# Patient Record
Sex: Male | Born: 1980 | Race: Black or African American | Hispanic: No | Marital: Married | Smoking: Current every day smoker
Health system: Southern US, Community
[De-identification: ages and names within clinical notes are randomized; demographics above are authoritative.]

## PROBLEM LIST (undated history)

## (undated) ENCOUNTER — Ambulatory Visit: Discharge status: Absent without leave | Payer: PRIVATE HEALTH INSURANCE

## (undated) ENCOUNTER — Ambulatory Visit: Admission: EM | Payer: Self-pay

## (undated) DIAGNOSIS — I10 Essential (primary) hypertension: Secondary | ICD-10-CM

## (undated) DIAGNOSIS — F419 Anxiety disorder, unspecified: Secondary | ICD-10-CM

## (undated) DIAGNOSIS — K219 Gastro-esophageal reflux disease without esophagitis: Secondary | ICD-10-CM

## (undated) HISTORY — DX: Anxiety disorder, unspecified: F41.9

---

## 2006-09-21 ENCOUNTER — Emergency Department (HOSPITAL_COMMUNITY): Admission: EM | Admit: 2006-09-21 | Discharge: 2006-09-21 | Payer: Self-pay | Admitting: Emergency Medicine

## 2007-06-14 ENCOUNTER — Emergency Department (HOSPITAL_COMMUNITY): Admission: EM | Admit: 2007-06-14 | Discharge: 2007-06-14 | Payer: Self-pay | Admitting: Emergency Medicine

## 2007-06-16 ENCOUNTER — Emergency Department (HOSPITAL_COMMUNITY): Admission: EM | Admit: 2007-06-16 | Discharge: 2007-06-16 | Payer: Self-pay | Admitting: Emergency Medicine

## 2011-04-15 LAB — COMPREHENSIVE METABOLIC PANEL
ALT: 43
AST: 30
Albumin: 4.4
BUN: 5 — ABNORMAL LOW
Calcium: 9.2
Chloride: 104
GFR calc Af Amer: >60
Potassium: 3.4 — ABNORMAL LOW
Sodium: 140

## 2011-04-15 LAB — DIFFERENTIAL
Basophils Absolute: 0
Basophils Absolute: 0
Eosinophils Absolute: 0.1 — ABNORMAL LOW
Eosinophils Absolute: 0.1 — ABNORMAL LOW
Eosinophils Relative: 2
Eosinophils Relative: 2
Lymphocytes Relative: 42
Monocytes Absolute: 0.5
Neutro Abs: 3
Neutro Abs: 3.5
Neutrophils Relative %: 53

## 2011-04-15 LAB — URINALYSIS, ROUTINE W REFLEX MICROSCOPIC
Bilirubin Urine: NEGATIVE
Ketones, ur: NEGATIVE
Nitrite: NEGATIVE
Nitrite: NEGATIVE
Specific Gravity, Urine: 1.009
pH: 6

## 2011-04-15 LAB — CBC
HCT: 50.2
MCHC: 33.5
MCV: 96.9
RBC: 5.22
RDW: 12.8
WBC: 6.6

## 2011-04-15 LAB — I-STAT 8, (EC8 V) (CONVERTED LAB)
Acid-Base Excess: 3 — ABNORMAL HIGH
BUN: 5 — ABNORMAL LOW
Bicarbonate: 29.4 — ABNORMAL HIGH
Chloride: 105
Potassium: 4.1
pCO2, Ven: 50.5 — ABNORMAL HIGH

## 2011-04-15 LAB — URINE MICROSCOPIC-ADD ON

## 2011-09-06 ENCOUNTER — Emergency Department (HOSPITAL_COMMUNITY): Payer: 59

## 2011-09-06 ENCOUNTER — Encounter (HOSPITAL_COMMUNITY): Payer: Self-pay | Admitting: Emergency Medicine

## 2011-09-06 ENCOUNTER — Emergency Department (HOSPITAL_COMMUNITY)
Admission: EM | Admit: 2011-09-06 | Discharge: 2011-09-06 | Disposition: A | Discharge status: Home / Self Care | Payer: 59 | Attending: Emergency Medicine | Admitting: Emergency Medicine

## 2011-09-06 DIAGNOSIS — J40 Bronchitis, not specified as acute or chronic: Secondary | ICD-10-CM | POA: Insufficient documentation

## 2011-09-06 DIAGNOSIS — K219 Gastro-esophageal reflux disease without esophagitis: Secondary | ICD-10-CM | POA: Insufficient documentation

## 2011-09-06 DIAGNOSIS — R51 Headache: Secondary | ICD-10-CM | POA: Insufficient documentation

## 2011-09-06 DIAGNOSIS — Z8639 Personal history of other endocrine, nutritional and metabolic disease: Secondary | ICD-10-CM | POA: Insufficient documentation

## 2011-09-06 DIAGNOSIS — R059 Cough, unspecified: Secondary | ICD-10-CM | POA: Insufficient documentation

## 2011-09-06 DIAGNOSIS — J029 Acute pharyngitis, unspecified: Secondary | ICD-10-CM | POA: Insufficient documentation

## 2011-09-06 DIAGNOSIS — J3489 Other specified disorders of nose and nasal sinuses: Secondary | ICD-10-CM | POA: Insufficient documentation

## 2011-09-06 DIAGNOSIS — H9209 Otalgia, unspecified ear: Secondary | ICD-10-CM | POA: Insufficient documentation

## 2011-09-06 DIAGNOSIS — R509 Fever, unspecified: Secondary | ICD-10-CM | POA: Insufficient documentation

## 2011-09-06 DIAGNOSIS — IMO0001 Reserved for inherently not codable concepts without codable children: Secondary | ICD-10-CM | POA: Insufficient documentation

## 2011-09-06 DIAGNOSIS — R05 Cough: Secondary | ICD-10-CM | POA: Insufficient documentation

## 2011-09-06 DIAGNOSIS — Z862 Personal history of diseases of the blood and blood-forming organs and certain disorders involving the immune mechanism: Secondary | ICD-10-CM | POA: Insufficient documentation

## 2011-09-06 HISTORY — DX: Gastro-esophageal reflux disease without esophagitis: K21.9

## 2011-09-06 MED ORDER — IBUPROFEN 800 MG PO TABS
800.0000 mg | ORAL_TABLET | Freq: Once | ORAL | Status: AC
  Administered 2011-09-06: 800 mg via ORAL
  Filled 2011-09-06: qty 1

## 2011-09-06 MED ORDER — GUAIFENESIN-CODEINE 100-10 MG/5ML PO SYRP: 10.0000 mL | ORAL_SOLUTION | Freq: Three times a day (TID) | ORAL | Status: AC | PRN

## 2011-09-06 MED ORDER — HYDROCOD POLST-CHLORPHEN POLST 10-8 MG/5ML PO LQCR
5.0000 mL | Freq: Once | ORAL | Status: AC
  Administered 2011-09-06: 5 mL via ORAL
  Filled 2011-09-06: qty 5

## 2011-09-06 NOTE — ED Notes (Signed)
Patient reports c/o cough, congestion, head pressure x 5 days. Denies nausea/vomiting/diarrhea.

## 2011-09-06 NOTE — Discharge Instructions (Signed)
Bronchitis Bronchitis is a problem of the air tubes leading to your lungs. This problem makes it hard for air to get in and out of the lungs. You may cough a lot because your air tubes are narrow. Going without care can cause lasting (chronic) bronchitis. HOME CARE   Drink enough fluids to keep your pee (urine) clear or pale yellow.   Use a cool mist humidifier.   Quit smoking if you smoke. If you keep smoking, the bronchitis might not get better.   Only take medicine as told by your doctor.  GET HELP RIGHT AWAY IF:   Coughing keeps you awake.   You start to wheeze.   You become more sick or weak.   You have a hard time breathing or get short of breath.   You cough up blood.   Coughing lasts more than 2 weeks.   You have a fever.   Your baby is older than 3 months with a rectal temperature of 102 F (38.9 C) or higher.   Your baby is 18 months old or younger with a rectal temperature of 100.4 F (38 C) or higher.  MAKE SURE YOU:  Understand these instructions.   Will watch your condition.   Will get help right away if you are not doing well or get worse.  Document Released: 12/11/2007 Document Revised: 03/06/2011 Document Reviewed: 05/26/2009 [facility] Patient Information 2012 [place], [place].Sore Throat Sore throats may be caused by bacteria and viruses. They may also be caused by:  Smoking.   Pollution.   Allergies.  If a sore throat is due to strep infection (a bacterial infection), you may need:  A throat swab.   A culture test to verify the strep infection.  You will need one of these:  An antibiotic shot.   Oral medicine for a full 10 days.  Strep infection is very contagious. A doctor should check any close contacts who have a sore throat or fever. A sore throat caused by a virus infection will usually last only 3-4 days. Antibiotics will not treat a viral sore throat.  Infectious mononucleosis (a viral disease), however, can cause a sore throat that  lasts for up to 3 weeks. Mononucleosis can be diagnosed with blood tests. You must have been sick for at least 1 week in order for the test to give accurate results. HOME CARE INSTRUCTIONS   To treat a sore throat, take mild pain medicine.   Increase your fluids.   Eat a soft diet.   Do not smoke.   Gargling with warm water or salt water (1 tsp. salt in 8 oz. water) can be helpful.   Try throat sprays or lozenges or sucking on hard candy to ease the symptoms.  Call your doctor if your sore throat lasts longer than 1 week.  SEEK IMMEDIATE MEDICAL CARE IF:  You have difficulty breathing.   You have increased swelling in the throat.   You have pain so severe that you are unable to swallow fluids or your saliva.   You have a severe headache, a high fever, vomiting, or a red rash.  Document Released: 08/01/2004 Document Revised: 03/06/2011 Document Reviewed: 06/11/2007 [facility] Patient Information 2012 [place], [place].Salt Water Gargle This solution will help make your mouth and throat feel better. HOME CARE INSTRUCTIONS   Mix 1 teaspoon of salt in 8 ounces of warm water.   Gargle with this solution as much or often as you need or as directed. Swish and gargle gently  if you have any sores or wounds in your mouth.   Do not swallow this mixture.  Document Released: 03/28/2004 Document Revised: 03/06/2011 Document Reviewed: 08/19/2008 [facility] Patient Information 2012 [place], [place].

## 2011-09-06 NOTE — ED Notes (Signed)
Patient with no complaints at this time. Respirations even and unlabored. Skin warm/dry. Discharge instructions reviewed with patient at this time. Patient given opportunity to voice concerns/ask questions. Patient discharged at this time and left Emergency Department with steady gait.   

## 2011-09-06 NOTE — ED Notes (Signed)
Al Decant, PA at bedside for patient eval.

## 2011-09-06 NOTE — ED Provider Notes (Signed)
Medical screening examination/treatment/procedure(s) were performed by non-physician practitioner and as supervising physician I was immediately available for consultation/collaboration.   Celene Kras, MD 09/06/11 [phone]

## 2011-09-06 NOTE — ED Provider Notes (Signed)
History     CSN: [id number]  Arrival date & time 09/06/11  0917   First MD Initiated Contact with Patient 09/06/11 [phone]      Chief Complaint  Patient presents with  . Cough  . Nasal Congestion    (Consider location/radiation/quality/duration/timing/severity/associated sxs/prior treatment) HPI Comments: Pt's children have been sick with similar sxs.  Frequent coughing makes his throat feel worse  Patient is a 31 y.o. male presenting with cough. The history is provided by the patient. No language interpreter was used.  Cough This is a new problem. Episode onset: several days ago. The problem occurs every few minutes. The problem has not changed since onset.The cough is non-productive. Maximum temperature: + subjective fever. Associated symptoms include ear pain, headaches, sore throat and myalgias. Pertinent negatives include no shortness of breath and no wheezing. Treatments tried: OTC cold products. The treatment provided mild relief.    Past Medical History  Diagnosis Date  . GERD (gastroesophageal reflux disease)   . Gout     History reviewed. No pertinent past surgical history.  No family history on file.  History  Substance Use Topics  . Smoking status: Current Everyday Smoker -- 0.5 packs/day  . Smokeless tobacco: Not on file  . Alcohol Use: Yes     occasionally      Review of Systems  Constitutional: Positive for fever.  HENT: Positive for ear pain and sore throat.   Respiratory: Positive for cough. Negative for shortness of breath, wheezing and stridor.   Gastrointestinal: Negative for nausea, vomiting and diarrhea.  Musculoskeletal: Positive for myalgias.  Neurological: Positive for headaches.  All other systems reviewed and are negative.    Allergies  Penicillins  Home Medications  No current outpatient prescriptions on file.  BP 134/84  Pulse 78  Temp(Src) 98 F (36.7 C) (Oral)  Resp 18  Ht 5\' 11"  (1.803 m)  Wt 248 lb (112.492 kg)  BMI 34.59  kg/m2  SpO2 97%  Physical Exam  Nursing note and vitals reviewed. Constitutional: He is oriented to person, place, and time. He appears well-developed and well-nourished. No distress.  HENT:  Head: Normocephalic and atraumatic.  Right Ear: Hearing, tympanic membrane, external ear and ear canal normal.  Left Ear: Hearing, tympanic membrane, external ear and ear canal normal.  Nose: Nose normal.  Mouth/Throat: Uvula is midline and mucous membranes are normal. No uvula swelling. Posterior oropharyngeal erythema present. No oropharyngeal exudate, posterior oropharyngeal edema or tonsillar abscesses.  Eyes: EOM are normal.  Neck: Normal range of motion.  Cardiovascular: Normal rate, regular rhythm, normal heart sounds and intact distal pulses.   Pulmonary/Chest: Effort normal and breath sounds normal. No accessory muscle usage. Not tachypneic. No respiratory distress. He has no decreased breath sounds. He has no wheezes. He has no rhonchi. He has no rales. He exhibits no tenderness.  Abdominal: Soft. He exhibits no distension. There is no tenderness.  Musculoskeletal: Normal range of motion.  Neurological: He is alert and oriented to person, place, and time.  Skin: Skin is warm and dry. He is not diaphoretic.  Psychiatric: He has a normal mood and affect. Judgment normal.    ED Course  Procedures (including critical care time)   Labs Reviewed  RAPID STREP SCREEN   Dg Chest 2 View  09/06/2011  *RADIOLOGY REPORT*  Clinical Data: Cough, fever  CHEST - 2 VIEW  Comparison: 09/21/2006  Findings: Enlargement of cardiac silhouette. Mediastinal contours and pulmonary vascularity normal. Low lung volumes with bibasilar atelectasis, greater  on right. Minimal peribronchial thickening. No pleural effusion, pulmonary filtrate or pneumothorax. No acute osseous findings.  IMPRESSION: Enlargement of cardiac silhouette. Bronchitic changes with decreased lung volumes with bibasilar atelectasis.  Original Report  Authenticated By: Lollie Marrow, M.D.     No diagnosis found.    MDM  Neg strep,  No PNA on CXR.  APAPand ibuprof for fever or discomfort.  Salt water gargles. Chloraseptic and rx for cough medicine.        Worthy Rancher, PA 09/06/11 1122

## 2013-05-22 ENCOUNTER — Emergency Department (HOSPITAL_COMMUNITY)
Admission: EM | Admit: 2013-05-22 | Discharge: 2013-05-22 | Disposition: A | Discharge status: Home / Self Care | Payer: BC Managed Care – PPO | Attending: Emergency Medicine | Admitting: Emergency Medicine

## 2013-05-22 ENCOUNTER — Emergency Department (HOSPITAL_COMMUNITY): Payer: BC Managed Care – PPO

## 2013-05-22 ENCOUNTER — Encounter (HOSPITAL_COMMUNITY): Payer: Self-pay | Admitting: Emergency Medicine

## 2013-05-22 DIAGNOSIS — J069 Acute upper respiratory infection, unspecified: Secondary | ICD-10-CM | POA: Insufficient documentation

## 2013-05-22 DIAGNOSIS — Z8719 Personal history of other diseases of the digestive system: Secondary | ICD-10-CM | POA: Insufficient documentation

## 2013-05-22 DIAGNOSIS — J40 Bronchitis, not specified as acute or chronic: Secondary | ICD-10-CM

## 2013-05-22 DIAGNOSIS — F17200 Nicotine dependence, unspecified, uncomplicated: Secondary | ICD-10-CM | POA: Insufficient documentation

## 2013-05-22 DIAGNOSIS — J209 Acute bronchitis, unspecified: Secondary | ICD-10-CM | POA: Insufficient documentation

## 2013-05-22 DIAGNOSIS — M2550 Pain in unspecified joint: Secondary | ICD-10-CM | POA: Insufficient documentation

## 2013-05-22 DIAGNOSIS — Z862 Personal history of diseases of the blood and blood-forming organs and certain disorders involving the immune mechanism: Secondary | ICD-10-CM | POA: Insufficient documentation

## 2013-05-22 DIAGNOSIS — Z8639 Personal history of other endocrine, nutritional and metabolic disease: Secondary | ICD-10-CM | POA: Insufficient documentation

## 2013-05-22 DIAGNOSIS — Z880 Allergy status to penicillin: Secondary | ICD-10-CM | POA: Insufficient documentation

## 2013-05-22 MED ORDER — ONDANSETRON 4 MG PO TBDP
ORAL_TABLET | ORAL | Status: AC
  Filled 2013-05-22: qty 1

## 2013-05-22 MED ORDER — CEPHALEXIN 500 MG PO CAPS: ORAL_CAPSULE | ORAL | Status: DC

## 2013-05-22 MED ORDER — HYDROCOD POLST-CHLORPHEN POLST 10-8 MG/5ML PO LQCR
5.0000 mL | Freq: Once | ORAL | Status: AC
  Administered 2013-05-22: 5 mL via ORAL
  Filled 2013-05-22: qty 5

## 2013-05-22 MED ORDER — PREDNISONE 10 MG PO TABS: ORAL_TABLET | ORAL | Status: DC

## 2013-05-22 MED ORDER — CEPHALEXIN 500 MG PO CAPS
500.0000 mg | ORAL_CAPSULE | Freq: Once | ORAL | Status: AC
  Administered 2013-05-22: 500 mg via ORAL
  Filled 2013-05-22: qty 1

## 2013-05-22 MED ORDER — PREDNISONE 50 MG PO TABS
60.0000 mg | ORAL_TABLET | Freq: Once | ORAL | Status: AC
  Administered 2013-05-22: 60 mg via ORAL
  Filled 2013-05-22 (×2): qty 1

## 2013-05-22 MED ORDER — ONDANSETRON 4 MG PO TBDP
4.0000 mg | ORAL_TABLET | Freq: Once | ORAL | Status: AC
  Administered 2013-05-22: 4 mg via ORAL

## 2013-05-22 MED ORDER — ONDANSETRON HCL 4 MG PO TABS: 4.0000 mg | ORAL_TABLET | Freq: Once | ORAL | Status: DC

## 2013-05-22 MED ORDER — PHENYLEPH-PROMETHAZINE-COD 5-6.25-10 MG/5ML PO SYRP: ORAL_SOLUTION | ORAL | Status: DC

## 2013-05-22 NOTE — ED Notes (Signed)
Cold symptoms for a week

## 2013-05-22 NOTE — ED Provider Notes (Signed)
CSN: [id number]     Arrival date & time 05/22/13  1730 History   First MD Initiated Contact with Patient 05/22/13 1816     Chief Complaint  Patient presents with  . URI   (Consider location/radiation/quality/duration/timing/severity/associated sxs/prior Treatment) Patient is a 32 y.o. male presenting with URI. The history is provided by the patient.  URI Presenting symptoms: congestion, cough, rhinorrhea and sore throat   Presenting symptoms: no fever   Severity:  Severe Onset quality:  Gradual Duration:  4 days Timing:  Intermittent Progression:  Worsening Chronicity:  New Relieved by:  Nothing Worsened by:  Nothing tried Ineffective treatments:  OTC medications Associated symptoms: arthralgias, headaches, sinus pain and sneezing   Associated symptoms: no neck pain and no wheezing   Risk factors: no immunosuppression, no recent illness and no recent travel     Past Medical History  Diagnosis Date  . GERD (gastroesophageal reflux disease)   . Gout    History reviewed. No pertinent past surgical history. No family history on file. History  Substance Use Topics  . Smoking status: Current Every Day Smoker -- 0.50 packs/day  . Smokeless tobacco: Not on file  . Alcohol Use: Yes     Comment: occasionally    Review of Systems  Constitutional: Negative for fever and activity change.       All ROS Neg except as noted in HPI  HENT: Positive for congestion, rhinorrhea, sneezing and sore throat. Negative for nosebleeds.   Eyes: Negative for photophobia and discharge.  Respiratory: Positive for cough. Negative for shortness of breath and wheezing.   Cardiovascular: Negative for chest pain and palpitations.  Gastrointestinal: Negative for abdominal pain and blood in stool.  Genitourinary: Negative for dysuria, frequency and hematuria.  Musculoskeletal: Positive for arthralgias. Negative for back pain and neck pain.  Skin: Negative.   Neurological: Positive for headaches.  Negative for dizziness, seizures and speech difficulty.  Psychiatric/Behavioral: Negative for hallucinations and confusion.    Allergies  Penicillins  Home Medications   Current Outpatient Rx  Name  Route  Sig  Dispense  Refill  . Phenyleph-CPM-DM-Aspirin (ALKA-SELTZER PLUS COLD & COUGH) 7.02-06-09-325 MG TBEF   Oral   Take 1 tablet by mouth once as needed (for cold symptoms).          BP 137/97  Pulse 83  Temp(Src) 98.7 F (37.1 C)  Ht 5\' 10"  (1.778 m)  Wt 225 lb (102.059 kg)  BMI 32.28 kg/m2  SpO2 93% Physical Exam  Nursing note and vitals reviewed. Constitutional: He is oriented to person, place, and time. He appears well-developed and well-nourished.  Non-toxic appearance.  HENT:  Head: Normocephalic.  Right Ear: Tympanic membrane and external ear normal.  Left Ear: Tympanic membrane and external ear normal.  Nasal congestion present. Increase redness of the posterior pharynx.  Eyes: EOM and lids are normal. Pupils are equal, round, and reactive to light.  Neck: Normal range of motion. Neck supple. Carotid bruit is not present.  Cardiovascular: Normal rate, regular rhythm, normal heart sounds, intact distal pulses and normal pulses.   Pulmonary/Chest: Breath sounds normal. No respiratory distress.  Course  breath sounds  Abdominal: Soft. Bowel sounds are normal. There is no tenderness. There is no guarding.  Musculoskeletal: Normal range of motion.  Lymphadenopathy:       Head (right side): No submandibular adenopathy present.       Head (left side): No submandibular adenopathy present.    He has no cervical adenopathy.  Neurological: He  is alert and oriented to person, place, and time. He has normal strength. No cranial nerve deficit or sensory deficit.  Skin: Skin is warm and dry. No rash noted.  Psychiatric: He has a normal mood and affect. His speech is normal.    ED Course  Procedures (including critical care time) Labs Review Labs Reviewed - No data to  display Imaging Review No results found.  EKG Interpretation   None       MDM  No diagnosis found. *I have reviewed nursing notes, vital signs, and all appropriate lab and imaging results for this patient.**  Patient presents to the emergency department with cough, congestion, sore throat, body aches, and generally not feeling well. He is unsure of high fevers at home.  Chest x-ray is negative for pneumonias or other acute findings.  Examination, vital signs, and x-rays suggest bronchitis, pharyngitis, and upper respiratory infection. The plan at this time is for the patient be treated with Keflex 500 mg 4 times a day, prednisone daily, and promethazine-VC cough medication. Patient advised to use a mask until this has improved. He has been given precautions handwashing. He is to return if any changes, problems, or concerns.  Kathie Dike, PA-C 05/22/13 2002

## 2013-05-23 NOTE — ED Provider Notes (Signed)
Medical screening examination/treatment/procedure(s) were performed by non-physician practitioner and as supervising physician I was immediately available for consultation/collaboration.  EKG Interpretation   None         Laray Anger, DO 05/23/13 0013

## 2013-10-31 ENCOUNTER — Encounter (HOSPITAL_COMMUNITY): Payer: Self-pay | Admitting: Emergency Medicine

## 2013-10-31 ENCOUNTER — Emergency Department (HOSPITAL_COMMUNITY)
Admission: EM | Admit: 2013-10-31 | Discharge: 2013-10-31 | Disposition: A | Discharge status: Home / Self Care | Payer: BC Managed Care – PPO | Attending: Emergency Medicine | Admitting: Emergency Medicine

## 2013-10-31 DIAGNOSIS — M62838 Other muscle spasm: Secondary | ICD-10-CM | POA: Insufficient documentation

## 2013-10-31 DIAGNOSIS — Z8719 Personal history of other diseases of the digestive system: Secondary | ICD-10-CM | POA: Insufficient documentation

## 2013-10-31 DIAGNOSIS — Z8639 Personal history of other endocrine, nutritional and metabolic disease: Secondary | ICD-10-CM | POA: Insufficient documentation

## 2013-10-31 DIAGNOSIS — M5412 Radiculopathy, cervical region: Secondary | ICD-10-CM | POA: Insufficient documentation

## 2013-10-31 DIAGNOSIS — Z880 Allergy status to penicillin: Secondary | ICD-10-CM | POA: Insufficient documentation

## 2013-10-31 DIAGNOSIS — F17200 Nicotine dependence, unspecified, uncomplicated: Secondary | ICD-10-CM | POA: Insufficient documentation

## 2013-10-31 DIAGNOSIS — Z862 Personal history of diseases of the blood and blood-forming organs and certain disorders involving the immune mechanism: Secondary | ICD-10-CM | POA: Insufficient documentation

## 2013-10-31 MED ORDER — CYCLOBENZAPRINE HCL 10 MG PO TABS: 10.0000 mg | ORAL_TABLET | Freq: Two times a day (BID) | ORAL | Status: DC | PRN

## 2013-10-31 MED ORDER — CYCLOBENZAPRINE HCL 10 MG PO TABS
10.0000 mg | ORAL_TABLET | Freq: Once | ORAL | Status: AC
  Administered 2013-10-31: 10 mg via ORAL
  Filled 2013-10-31: qty 1

## 2013-10-31 MED ORDER — NAPROXEN 500 MG PO TABS: 500.0000 mg | ORAL_TABLET | Freq: Two times a day (BID) | ORAL | Status: DC

## 2013-10-31 NOTE — Discharge Instructions (Signed)
Takes naproxen twice daily as directed. Take Flexeril as directed as needed for muscle spasm. Apply heating pad to the back of her neck and shoulder. Followup with one of the resources below to establish care with a primary care physician. RESOURCE GUIDE  Chronic Pain Problems: Contact [facility]  [phone] Patients need to be referred by their primary care doctor.  Insufficient Money for Medicine: Contact [employer]:  call "211."   No Primary Care Doctor: - Call [facility]  [phone] - can help you locate a primary care doctor that  accepts your insurance, provides certain services, etc. - Physician Referral Service- [phone]  Agencies that provide inexpensive medical care: - [facility]  [phone] - [facility]  [phone] - [facility]  [phone] - [facility]  [phone] - [facility]  [phone] - [facility]  [phone]  Medicaid-accepting [facility] Providers: - [facility]- [address]  [phone], Mon-Fri 9am-7pm, Sat 9am-1pm - [facility]- [address] [place], [address] [place]  [phone] - [facility]- [address] [place]  [phone] [facility]- [address]  [phone] - Renaye Rakers- [address] [place], [address], [phone]  Only accepts [place] patients after they have their name  applied to their card  Self Pay (no insurance) in [place]: - Sickle Cell Patients: Dr Willey Blade, [facility]  [address] [place], [phone] - [facility]- [address] [place]  [phone]       [facility]- [address]       -     [facility]- see information above (Speak to [employer] if you do not have insurance)       -  [facility]- [address],  [phone]       -  [facility]-  [address] [phone]       -  Dr Julio Sicks-  [address], [place], [phone]       -  [facility] - [address] [phone]       -  [facility]- [address], [phone], also [address], [phone]       -    [facility]- [address] [place], [phone], 1st & 3rd Saturday        every month, 10am-1pm  1) Find a Doctor and Pay Out of Pocket Although you won't have to find out who is covered by your insurance plan, it is a good idea to ask around and get recommendations. You will then need to call the office and see if the doctor you have chosen will accept you as a new patient and what types of options they offer for patients who are self-pay. Some doctors offer discounts or will set up payment plans for their patients who do not have insurance, but you will need to ask so you aren't surprised when you get to your appointment.  2) Contact Your Local Health Department Not all health departments have doctors that can see patients for sick visits, but many do, so it is worth a call to see if yours does. If you don't know where your local health department is,  you can check in your phone book. The CDC also has a tool to help you locate your state's health department, and many state websites also have listings of all of their local health departments.  3) Find a Walk-in Clinic If your illness is not likely to be very severe or complicated, you may want to try a walk in clinic. These are popping up all over the country in pharmacies, drugstores, and shopping centers. They're usually staffed by nurse practitioners or physician assistants that have been trained to treat common illnesses and complaints. They're usually fairly quick and inexpensive. However, if you have serious medical issues or chronic medical problems, these are probably not your best option  Cervical Radiculopathy Cervical radiculopathy happens  when a nerve in the neck is pinched or bruised by a slipped (herniated) disk or by arthritic changes in the bones of the cervical spine. This can occur due to an injury or as part of the normal aging process. Pressure on the cervical nerves can cause pain or numbness that runs from your neck all the way down into your arm and fingers. CAUSES  There are many possible causes, including:  Injury.  Muscle tightness in the neck from overuse.  Swollen, painful joints (arthritis).  Breakdown or degeneration in the bones and joints of the spine (spondylosis) due to aging.  Bone spurs that may develop near the cervical nerves. SYMPTOMS  Symptoms include pain, weakness, or numbness in the affected arm and hand. Pain can be severe or irritating. Symptoms may be worse when extending or turning the neck. DIAGNOSIS  Your caregiver will ask about your symptoms and do a physical exam. He or she may test your strength and reflexes. X-rays, CT scans, and MRI scans may be needed in cases of injury or if the symptoms do not go away after a period of time. Electromyography (EMG) or nerve conduction testing may be done to study how your nerves and muscles are working. TREATMENT  Your caregiver may recommend certain exercises to help relieve your symptoms. Cervical radiculopathy can, and often does, get better with time and treatment. If your problems continue, treatment options may include:  Wearing a soft collar for short periods of time.  Physical therapy to strengthen the neck muscles.  Medicines, such as nonsteroidal anti-inflammatory drugs (NSAIDs), oral corticosteroids, or spinal injections.  Surgery. Different types of surgery may be done depending on the cause of your problems. HOME CARE INSTRUCTIONS   Put ice on the affected area.  Put ice in a plastic bag.  Place a towel between your skin and the bag.  Leave the ice on for 15-20 minutes, 03-04 times a day or as directed by your  caregiver.  If ice does not help, you can try using heat. Take a warm shower or bath, or use a hot water bottle as directed by your caregiver.  You may try a gentle neck and shoulder massage.  Use a flat pillow when you sleep.  Only take over-the-counter or prescription medicines for pain, discomfort, or fever as directed by your caregiver.  If physical therapy was prescribed, follow your caregiver's directions.  If a soft collar was prescribed, use it as directed. SEEK IMMEDIATE MEDICAL CARE IF:   Your pain gets much worse and cannot be controlled with medicines.  You have weakness or numbness in your hand, arm, face, or leg.  You have a high fever or a stiff, rigid neck.  You lose bowel or bladder control (incontinence).  You  have trouble with walking, balance, or speaking. MAKE SURE YOU:   Understand these instructions.  Will watch your condition.  Will get help right away if you are not doing well or get worse. Document Released: 03/19/2001 Document Revised: 09/16/2011 Document Reviewed: 02/05/2011 [facility] Patient Information 2014 [place].  Spasticity Spasticity is a condition in which certain muscles contract continuously. This causes stiffness or tightness of the muscles. It may interfere with movement, speech, and manner of walking. CAUSES  This condition is usually caused by damage to the portion of the brain or spinal cord that controls voluntary movement. It may occur in association with:  Spinal cord injury.  Multiple sclerosis.  Cerebral palsy.  Brain damage due to lack of oxygen.  Brain trauma.  Severe head injury.  Metabolic diseases such as:  Adrenoleukodystrophy.  ALS Truddie Hidden(Lou Gehrig's disease).  Phenylketonuria. SYMPTOMS   Increased muscle tone (hypertonicity).  A series of rapid muscle contractions (clonus).  Exaggerated deep tendon reflexes.  Muscle spasms.  Involuntary crossing of the legs (scissoring).  Fixed joints. The  degree of spasticity varies. It ranges from mild muscle stiffness to severe, painful, and uncontrollable muscle spasms. It can interfere with rehabilitation in patients with certain disorders. It often interferes with daily activities. TREATMENT  Treatment may include:  Medications.  Physical therapy regimens. They may include muscle stretching and range of motion exercises. These help prevent shrinkage or shortening of muscles. They also help reduce the severity of symptoms.  Surgery. This may be recommended for tendon release or to sever the nerve-muscle pathway. PROGNOSIS  The outcome for those with spasticity depends on:  Severity of the spasticity.  Associated disorder(s). Document Released: 06/14/2002 Document Revised: 09/16/2011 Document Reviewed: 06/24/2005 [facility] Patient Information 2014 [place], [place].

## 2013-10-31 NOTE — ED Provider Notes (Signed)
CSN: [id number]     Arrival date & time 10/31/13  0751 History   First MD Initiated Contact with Patient 10/31/13 [phone]     Chief Complaint  Patient presents with  . Arm Pain     (Consider location/radiation/quality/duration/timing/severity/associated sxs/prior Treatment) HPI Comments: Pt is a 33 y/o male with a PMHx of GERD and gout who presents to the ED complaining of left arm pain and numbness x 1.5 weeks. Pt states pain starts in the left side of his neck, down his left shoulder and into his arm with associated numbness and tingling into his finger tips. Occasionally has tingling in his right finger tips. Denies weakness. No aggravating or alleviating factors. 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 Right hand dominant. Denies injury or trauma. States he had similar symptoms about a year and a half ago and was told he had a pinched nerve. He tried taking Tylenol last night with no relief. Denies fever or chills.  Patient is a 33 y.o. male presenting with arm pain. The history is provided by the patient.  Arm Pain Associated symptoms include numbness.    Past Medical History  Diagnosis Date  . GERD (gastroesophageal reflux disease)   . Gout    History reviewed. No pertinent past surgical history. No family history on file. History  Substance Use Topics  . Smoking status: Current Every Day Smoker -- 0.50 packs/day    Types: Cigarettes  . Smokeless tobacco: Not on file  . Alcohol Use: Yes     Comment: occasionally    Review of Systems  Musculoskeletal:       Positive for left-sided neck, shoulder and arm pain.  Neurological: Positive for numbness.  All other systems reviewed and are negative.     Allergies  Penicillins  Home Medications   Prior to Admission medications   Medication Sig Start Date End Date Taking? Authorizing Provider  Acetaminophen (TYLENOL PO) Take 1 tablet by mouth every 6 (six) hours as needed (pain).   Yes  Historical Provider, MD  cyclobenzaprine (FLEXERIL) 10 MG tablet Take 1 tablet (10 mg total) by mouth 2 (two) times daily as needed for muscle spasms. 10/31/13   Trevor Maceobyn M Albert, PA-C  naproxen (NAPROSYN) 500 MG tablet Take 1 tablet (500 mg total) by mouth 2 (two) times daily. 10/31/13   Trevor Maceobyn M Albert, PA-C   BP 132/83  Pulse 92  Temp(Src) 97.6 F (36.4 C) (Oral)  Resp 16  SpO2 98% Physical Exam  Nursing note and vitals reviewed. Constitutional: He is oriented to person, place, and time. He appears well-developed and well-nourished. No distress.  HENT:  Head: Normocephalic and atraumatic.  Mouth/Throat: Oropharynx is clear and moist.  Eyes: Conjunctivae are normal.  Neck: Normal range of motion. Neck supple.  Cardiovascular: Normal rate, regular rhythm and normal heart sounds.   +2 radial pulses bilaterally.  Pulmonary/Chest: Effort normal and breath sounds normal.  Musculoskeletal: Normal range of motion. He exhibits no edema.  Tender to palpation over left cervical paraspinal muscles and trapezius with spasm. No spinous process tenderness. No bony tenderness of left shoulder. Full left shoulder and cervical range of motion. Left elbow and wrist normal.  Neurological: He is alert and oriented to person, place, and time.  Strength 5/5 upper extremities and equal bilateral. Sensation grossly intact. Negative Tinel's.  Skin: Skin is warm and dry. He is not diaphoretic.  Psychiatric: He has a normal mood and affect. His behavior  is normal.    ED Course  Procedures (including critical care time) Labs Review Labs Reviewed - No data to display  Imaging Review No results found.   EKG Interpretation None      MDM   Final diagnoses:  Cervical radiculopathy  Muscle spasm of left shoulder area   Patient presenting with left-sided neck and shoulder pain with associated numbness or tingling. Neurologic exam unremarkable, no focal deficits. He has muscle spasm. History of pinched  nerve. Will prescribe naproxen and Flexeril. Advised followup with PCP. Stable for discharge. Return precautions given. Patient states understanding of treatment care plan and is agreeable.   Trevor MaceRobyn M Albert, PA-C 10/31/13 [phone]

## 2013-10-31 NOTE — ED Notes (Signed)
Pt c/o left arm pain that started 1 1/2 weeks ago and numbness and tingling in bilateral finger tips. . Pt has hx of left arm pain that happen about 2 years ago stated that he does not recommend what they said but was dx with pinched nerve. Denies any trauma that could have caused pain.

## 2013-10-31 NOTE — ED Provider Notes (Signed)
Medical screening examination/treatment/procedure(s) were performed by non-physician practitioner and as supervising physician I was immediately available for consultation/collaboration.     Geoffery Lyonsouglas Rainy Rothman, MD 10/31/13 [phone]

## 2013-11-09 ENCOUNTER — Other Ambulatory Visit: Payer: Self-pay | Admitting: Sports Medicine

## 2013-11-09 DIAGNOSIS — M542 Cervicalgia: Secondary | ICD-10-CM

## 2013-11-11 ENCOUNTER — Other Ambulatory Visit: Payer: BC Managed Care – PPO

## 2013-11-16 ENCOUNTER — Inpatient Hospital Stay: Admission: RE | Admit: 2013-11-16 | Payer: BC Managed Care – PPO | Source: Ambulatory Visit

## 2014-06-25 ENCOUNTER — Other Ambulatory Visit: Payer: BC Managed Care – PPO

## 2014-11-19 DIAGNOSIS — B079 Viral wart, unspecified: Secondary | ICD-10-CM | POA: Insufficient documentation

## 2014-11-19 DIAGNOSIS — R2232 Localized swelling, mass and lump, left upper limb: Secondary | ICD-10-CM | POA: Insufficient documentation

## 2017-02-05 ENCOUNTER — Emergency Department (HOSPITAL_COMMUNITY)
Admission: EM | Admit: 2017-02-05 | Discharge: 2017-02-05 | Disposition: A | Discharge status: Home / Self Care | Payer: 59 | Attending: Emergency Medicine | Admitting: Emergency Medicine

## 2017-02-05 ENCOUNTER — Emergency Department (HOSPITAL_COMMUNITY): Payer: 59

## 2017-02-05 ENCOUNTER — Encounter (HOSPITAL_COMMUNITY): Payer: Self-pay | Admitting: Emergency Medicine

## 2017-02-05 DIAGNOSIS — S63634A Sprain of interphalangeal joint of right ring finger, initial encounter: Secondary | ICD-10-CM

## 2017-02-05 DIAGNOSIS — Z79899 Other long term (current) drug therapy: Secondary | ICD-10-CM | POA: Diagnosis not present

## 2017-02-05 DIAGNOSIS — Y9389 Activity, other specified: Secondary | ICD-10-CM | POA: Insufficient documentation

## 2017-02-05 DIAGNOSIS — Y929 Unspecified place or not applicable: Secondary | ICD-10-CM | POA: Insufficient documentation

## 2017-02-05 DIAGNOSIS — Y998 Other external cause status: Secondary | ICD-10-CM | POA: Insufficient documentation

## 2017-02-05 DIAGNOSIS — W228XXA Striking against or struck by other objects, initial encounter: Secondary | ICD-10-CM | POA: Diagnosis not present

## 2017-02-05 DIAGNOSIS — S6991XA Unspecified injury of right wrist, hand and finger(s), initial encounter: Secondary | ICD-10-CM | POA: Diagnosis present

## 2017-02-05 DIAGNOSIS — R030 Elevated blood-pressure reading, without diagnosis of hypertension: Secondary | ICD-10-CM

## 2017-02-05 MED ORDER — TRAMADOL HCL 50 MG PO TABS: 50.0000 mg | ORAL_TABLET | Freq: Four times a day (QID) | ORAL | 0 refills | Status: DC | PRN

## 2017-02-05 MED ORDER — NAPROXEN 500 MG PO TABS: 500.0000 mg | ORAL_TABLET | Freq: Two times a day (BID) | ORAL | 0 refills | Status: DC

## 2017-02-05 MED ORDER — TRAMADOL HCL 50 MG PO TABS
50.0000 mg | ORAL_TABLET | Freq: Once | ORAL | Status: AC
  Administered 2017-02-05: 50 mg via ORAL
  Filled 2017-02-05: qty 1

## 2017-02-05 MED ORDER — NAPROXEN 250 MG PO TABS
500.0000 mg | ORAL_TABLET | Freq: Once | ORAL | Status: AC
  Administered 2017-02-05: 500 mg via ORAL
  Filled 2017-02-05: qty 2
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
## 2017-02-05 NOTE — ED Provider Notes (Signed)
[facility]EMERGENCY DEPT Provider Note   CSN: [id number] Arrival date & time: 02/05/17  1032     History   Chief Complaint Chief Complaint  Patient presents with  . Hand Pain    HPI Gerald Phillips is a 36 y.o. right handed male presenting with a 2 day history of right hand pain secondary to punching a freezer 2 nights ago.  He reports initially having pain and a popping sensation in his right right finger which has since become more swollen, bruised and is having difficulty completely extending the digit.  His 5th finger is also affected, but less so. He does have radiation into the dorsal hand with extension of these fingers.  He denies wrist and forearm pain and has taken naproxen without improvement in pain.    The history is provided by the patient.    Past Medical History:  Diagnosis Date  . GERD (gastroesophageal reflux disease)   . Gout     There are no active problems to display for this patient.   History reviewed. No pertinent surgical history.     Home Medications    Prior to Admission medications   Medication Sig Start Date End Date Taking? Authorizing Provider  Acetaminophen (TYLENOL PO) Take 1 tablet by mouth every 6 (six) hours as needed (pain).    [provider]  cyclobenzaprine (FLEXERIL) 10 MG tablet Take 1 tablet (10 mg total) by mouth 2 (two) times daily as needed for muscle spasms. 10/31/13   Hess, Nada Boozerobyn M, PA-C  naproxen (NAPROSYN) 500 MG tablet Take 1 tablet (500 mg total) by mouth 2 (two) times daily. 02/05/17   Burgess AmorIdol, Benedetto Ryder, PA-C  traMADol (ULTRAM) 50 MG tablet Take 1 tablet (50 mg total) by mouth every 6 (six) hours as needed. 02/05/17   Burgess AmorIdol, Natasa Stigall, PA-C    Family History No family history on file.  Social History Social History  Substance Use Topics  . Smoking status: Current Every Day Smoker    Packs/day: 0.50    Types: Cigarettes  . Smokeless tobacco: Never Used  . Alcohol use Yes     Comment: occasionally     Allergies     Penicillins   Review of Systems Review of Systems  Constitutional: Negative for fever.  Musculoskeletal: Positive for arthralgias and joint swelling. Negative for myalgias.  Skin: Positive for color change. Negative for wound.  Neurological: Negative for weakness and numbness.     Physical Exam Updated Vital Signs BP (!) 142/98 (BP Location: Left Arm)   Pulse 79   Temp 98.2 F (36.8 C) (Oral)   Resp 16   Ht 5\' 10"  (1.778 m)   Wt 102.5 kg (226 lb)   SpO2 98%   BMI 32.43 kg/m   Physical Exam  Constitutional: He appears well-developed and well-nourished.  HENT:  Head: Atraumatic.  Neck: Normal range of motion.  Cardiovascular:  Pulses equal bilaterally  Musculoskeletal: He exhibits edema and tenderness.  ttp right 4th and 5th fingers, most localized to the ring finger, pip joint with moderate edema and bruising.  Distal sensation intact with less than 2 sec cap refill.  No deformity. Pt cannot fully extend the 4th finger at the pip joint.  Increased pain here also with passive extension.  Full fist appreciated (slightly limited by degree of edema) . No hand deformity or point tenderness.  Neurological: He is alert. He has normal strength. He displays normal reflexes. No sensory deficit.  Skin: Skin is warm and dry.  Psychiatric:  He has a normal mood and affect.     ED Treatments / Results  Labs (all labs ordered are listed, but only abnormal results are displayed) Labs Reviewed - No data to display  EKG  EKG Interpretation None       Radiology Dg Hand Complete Right  Result Date: 02/05/2017 CLINICAL DATA:  Hit right hand on freezer 2 days ago with persistent pain involving the posterior aspect of the hand and knuckles. EXAM: RIGHT HAND - COMPLETE 3+ VIEW COMPARISON:  None. FINDINGS: No fracture or dislocation. Joint spaces are preserved. No erosions. No evidence of chondrocalcinosis. No radiopaque foreign body. Note is made of a suspected accessory ossicle about  the anterior aspect of the scaphoid. IMPRESSION: 1. No acute fracture or radiopaque foreign body. 2. Suspected tiny accessory ossicle about the scaphoid. Electronically Signed   By: Simonne ComeJohn  Watts M.D.   On: 02/05/2017 11:04    Procedures Procedures (including critical care time)  Medications Ordered in ED Medications  traMADol (ULTRAM) tablet 50 mg (50 mg Oral Given 02/05/17 1217)  naproxen (NAPROSYN) tablet 500 mg (500 mg Oral Given 02/05/17 1217)     Initial Impression / Assessment and Plan / ED Course  I have reviewed the triage vital signs and the nursing notes.  Pertinent labs & imaging results that were available during my care of the patient were reviewed by me and considered in my medical decision making (see chart for details).     Imaging reviewed.  No pain at the scaphoid.  Pt with edema and bruising predominantly of the right pip joint. No fractures or dislocation.  Suspect simply sprained joint, but also discussed possible ligament injury at this joint.  Pt was placed in a short ulner gutter splint for prn pain relief, tramadol prescribed, may continue home nsaids.  Ice, elevation.  Referral to hand for a f/u in one week if sx persist without any improvement.  Discussed elevated bp, recheck improved, but advised recheck again once pain better.  Pt understands.  Final Clinical Impressions(s) / ED Diagnoses   Final diagnoses:  Sprain of interphalangeal joint of right ring finger, initial encounter  Elevated blood pressure reading    New Prescriptions Discharge Medication List as of 02/05/2017 12:17 PM    START taking these medications   Details  traMADol (ULTRAM) 50 MG tablet Take 1 tablet (50 mg total) by mouth every 6 (six) hours as needed., Starting Wed 02/05/2017, Print         Burgess Amordol, Phillipe Clemon, PA-C 02/06/17 1037    Linwood DibblesKnapp, Jon, MD 02/07/17 [phone]

## 2017-02-05 NOTE — Discharge Instructions (Signed)
Rest, ice and elevate your hand as much as possible. Wear the splint provided to rest the affected fingers as much as possible.  You have been referred to a hand specialist for a recheck of your hand if not improving over the next week.  Your blood pressure is elevated today, initially was 173/107, possibly due to your hand pain.  However, it is important for you to have this rechecked once your pain is improved. Do not drive within 4 hours of taking the tramadol as this medicine will make you sleepy.

## 2017-05-26 ENCOUNTER — Encounter (HOSPITAL_COMMUNITY): Payer: Self-pay | Admitting: Emergency Medicine

## 2017-05-26 ENCOUNTER — Emergency Department (HOSPITAL_COMMUNITY): Payer: 59

## 2017-05-26 DIAGNOSIS — Z79899 Other long term (current) drug therapy: Secondary | ICD-10-CM | POA: Diagnosis not present

## 2017-05-26 DIAGNOSIS — I10 Essential (primary) hypertension: Secondary | ICD-10-CM | POA: Insufficient documentation

## 2017-05-26 DIAGNOSIS — F17210 Nicotine dependence, cigarettes, uncomplicated: Secondary | ICD-10-CM | POA: Diagnosis not present

## 2017-05-26 DIAGNOSIS — R0789 Other chest pain: Secondary | ICD-10-CM | POA: Diagnosis not present

## 2017-05-26 LAB — CBC
HCT: 45.1 % (ref 39.0–52.0)
Hemoglobin: 15.8 g/dL (ref 13.0–17.0)
MCH: 33.7 pg (ref 26.0–34.0)
MCHC: 35 g/dL (ref 30.0–36.0)
MCV: 96.2 fL (ref 78.0–100.0)
Platelets: 149 10*3/uL — ABNORMAL LOW (ref 150–400)
RBC: 4.69 MIL/uL (ref 4.22–5.81)
RDW: 12.9 % (ref 11.5–15.5)
WBC: 8 10*3/uL (ref 4.0–10.5)

## 2017-05-26 LAB — BASIC METABOLIC PANEL
Anion gap: 8 (ref 5–15)
BUN: 11 mg/dL (ref 6–20)
CALCIUM: 9.2 mg/dL (ref 8.9–10.3)
CO2: 24 mmol/L (ref 22–32)
Chloride: 105 mmol/L (ref 101–111)
Creatinine, Ser: 1.1 mg/dL (ref 0.61–1.24)
GFR calc Af Amer: >60 mL/min (ref >60)
GFR calc non Af Amer: >60 mL/min (ref >60)
GLUCOSE: 89 mg/dL (ref 65–99)
POTASSIUM: 3.9 mmol/L (ref 3.5–5.1)
SODIUM: 137 mmol/L (ref 135–145)

## 2017-05-26 LAB — I-STAT TROPONIN, ED: TROPONIN I, POC: 0 ng/mL (ref 0.00–0.08)

## 2017-05-26 NOTE — ED Triage Notes (Signed)
Patient reports intermittent pain across his chest for 2 weeks , denies pain at triage , mild SOB , denies emesis or diaphoresis .

## 2017-05-27 ENCOUNTER — Emergency Department (HOSPITAL_COMMUNITY)
Admission: EM | Admit: 2017-05-27 | Discharge: 2017-05-27 | Disposition: A | Discharge status: Home / Self Care | Payer: 59 | Attending: Emergency Medicine | Admitting: Emergency Medicine

## 2017-05-27 DIAGNOSIS — I10 Essential (primary) hypertension: Secondary | ICD-10-CM

## 2017-05-27 MED ORDER — HYDROCHLOROTHIAZIDE 25 MG PO TABS
25.0000 mg | ORAL_TABLET | Freq: Once | ORAL | Status: AC
  Administered 2017-05-27: 25 mg via ORAL
  Filled 2017-05-27: qty 1

## 2017-05-27 MED ORDER — HYDROCHLOROTHIAZIDE 25 MG PO TABS: 25.0000 mg | ORAL_TABLET | Freq: Every day | ORAL | 0 refills | Status: DC

## 2017-05-27 NOTE — ED Notes (Signed)
ED Provider at bedside. 

## 2017-05-27 NOTE — ED Provider Notes (Signed)
[facility]   CSN: [id number] Arrival date & time: 05/26/17  2146     History   Chief Complaint Chief Complaint  Patient presents with  . Chest Pain    HPI Gerald Phillips is a 36 y.o. male.  HPI  Pt with hx of GERD presenting with concern for hypertension.  Pt states that he was seen at an urgent care and was advised to come to the ED for evaluation of his blood pressure.  He states that his pressure has been evaluated and has been high for the past 1-2 years.  He has not had any treatment.  He states he occasionally has chest pain when he yawns too deeply.  No current chest pain.  No difficulty breathing.  No weakness or numbness.  No changes in vision or speech.  He states sometimes he has a headache when his pressure is high.  There are no other associated systemic symptoms, there are no other alleviating or modifying factors.   Past Medical History:  Diagnosis Date  . GERD (gastroesophageal reflux disease)   . Gout     There are no active problems to display for this patient.   History reviewed. No pertinent surgical history.     Home Medications    Prior to Admission medications   Medication Sig Start Date End Date Taking? Authorizing Provider  Acetaminophen (TYLENOL PO) Take 1 tablet by mouth every 6 (six) hours as needed (pain).    [provider]  cyclobenzaprine (FLEXERIL) 10 MG tablet Take 1 tablet (10 mg total) by mouth 2 (two) times daily as needed for muscle spasms. 10/31/13   Hess, Nada Boozerobyn M, PA-C  hydrochlorothiazide (HYDRODIURIL) 25 MG tablet Take 1 tablet (25 mg total) daily by mouth. 05/27/17   Meziah Blasingame, Latanya MaudlinMartha L, MD  naproxen (NAPROSYN) 500 MG tablet Take 1 tablet (500 mg total) by mouth 2 (two) times daily. 02/05/17   Burgess AmorIdol, Julie, PA-C  traMADol (ULTRAM) 50 MG tablet Take 1 tablet (50 mg total) by mouth every 6 (six) hours as needed. 02/05/17   Burgess AmorIdol, Julie, PA-C    Family History No family history  on file.  Social History Social History   Tobacco Use  . Smoking status: Current Every Day Smoker    Packs/day: 0.50    Types: Cigarettes  . Smokeless tobacco: Never Used  Substance Use Topics  . Alcohol use: Yes    Comment: occasionally  . Drug use: No     Allergies   Penicillins   Review of Systems Review of Systems  ROS reviewed and all otherwise negative except for mentioned in HPI   Physical Exam Updated Vital Signs BP (!) 165/114   Pulse 80   Temp 98.7 F (37.1 C) (Oral)   Resp 18   SpO2 98%  Vitals reviewed Physical Exam  Physical Examination: General appearance - alert, well appearing, and in no distress Mental status - alert, oriented to person, place, and time Eyes -no conjunctival injection, no scleral icterus Chest - clear to auscultation, no wheezes, rales or rhonchi, symmetric air entry Heart - normal rate, regular rhythm, normal S1, S2, no murmurs, rubs, clicks or gallops Abdomen - soft, nontender, nondistended, no masses or organomegaly Neurological - alert, oriented x 3, no cranial nerve deficit, strength 5/5 in extremities x 4 Extremities - peripheral pulses normal, no pedal edema, no clubbing or cyanosis Skin - normal coloration and turgor, no rashes   ED Treatments / Results  Labs (all labs ordered are listed, but only abnormal results are displayed) Labs Reviewed  CBC - Abnormal; Notable for the following components:      Result Value   Platelets 149 (*)    All other components within normal limits  BASIC METABOLIC PANEL  I-STAT TROPONIN, ED    EKG  EKG Interpretation  Date/Time:  Monday May 26 2017 22:02:38 EST Ventricular Rate:  86 PR Interval:  150 QRS Duration: 80 QT Interval:  354 QTC Calculation: 423 R Axis:   -12 Text Interpretation:  Normal sinus rhythm Normal ECG No old tracing to compare Confirmed by Jerelyn ScottLinker, Shanvi Moyd [phone]([id number]) on 05/27/2017 9:26:16 AM       Radiology Dg Chest 2 View  Result Date:  05/26/2017 CLINICAL DATA:  Right anterior lower rib pain starting this morning. EXAM: CHEST  2 VIEW COMPARISON:  None. FINDINGS: The heart size and mediastinal contours are within normal limits. Slightly low lung volumes without pneumonic consolidation or pneumothorax. No pulmonary edema nor effusion. There is mild degenerative disc space narrowing along the dorsal spine. No acute displaced rib fracture nor suspicious osseous lesions are identified. IMPRESSION: No active cardiopulmonary disease.  No acute osseous abnormality. Electronically Signed   By: Tollie Ethavid  Kwon M.D.   On: 05/26/2017 22:38    Procedures Procedures (including critical care time)  Medications Ordered in ED Medications  hydrochlorothiazide (HYDRODIURIL) tablet 25 mg (25 mg Oral Given 05/27/17 0943)     Initial Impression / Assessment and Plan / ED Course  I have reviewed the triage vital signs and the nursing notes.  Pertinent labs & imaging results that were available during my care of the patient were reviewed by me and considered in my medical decision making (see chart for details).   Patient presenting with concern for hypertension he has no current symptoms.  He was recommended to come for evaluation in the ER by an urgent care.  He has no signs of endorgan damage on his workup today.  I have discussed that we will start him on a low-dose blood pressure medication and the importance of frequent follow-up.Discharged with strict return precautions.  Pt agreeable with plan.    Final Clinical Impressions(s) / ED Diagnoses   Final diagnoses:  Hypertension, unspecified type    ED Discharge Orders        Ordered    hydrochlorothiazide (HYDRODIURIL) 25 MG tablet  Daily     05/27/17 0954       Phillis HaggisMabe, Jayden Kratochvil L, MD 05/27/17 1048

## 2017-05-27 NOTE — Discharge Instructions (Signed)
Return to the ED with any concerns including chest pain, difficulty breathing, weakness of arms or legs, changes in vision or speech, severe headache, fainting, decreased level of alertness/lethargy, or any other alarming symptoms

## 2019-01-19 ENCOUNTER — Ambulatory Visit: Payer: 59 | Admitting: Family Medicine

## 2019-01-20 ENCOUNTER — Encounter: Payer: Self-pay | Admitting: Family Medicine

## 2019-01-20 ENCOUNTER — Other Ambulatory Visit: Payer: Self-pay

## 2019-01-20 ENCOUNTER — Ambulatory Visit (INDEPENDENT_AMBULATORY_CARE_PROVIDER_SITE_OTHER): Payer: 59 | Admitting: Family Medicine

## 2019-01-20 VITALS — BP 166/109 | HR 81 | Temp 96.0°F | Resp 16 | Ht 70.0 in | Wt 252.0 lb

## 2019-01-20 DIAGNOSIS — I10 Essential (primary) hypertension: Secondary | ICD-10-CM | POA: Diagnosis not present

## 2019-01-20 DIAGNOSIS — M5442 Lumbago with sciatica, left side: Secondary | ICD-10-CM | POA: Diagnosis not present

## 2019-01-20 DIAGNOSIS — M542 Cervicalgia: Secondary | ICD-10-CM | POA: Diagnosis not present

## 2019-01-20 DIAGNOSIS — M79605 Pain in left leg: Secondary | ICD-10-CM | POA: Diagnosis not present
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
## 2019-01-20 NOTE — Patient Instructions (Signed)
   Magnesium:  400 mg daily  L-Theanine:  100-300 mg at bed time (could take twice daily if needed)

## 2019-02-12 ENCOUNTER — Encounter: Payer: Self-pay | Admitting: Family Medicine

## 2019-02-12 ENCOUNTER — Ambulatory Visit (INDEPENDENT_AMBULATORY_CARE_PROVIDER_SITE_OTHER): Payer: 59 | Admitting: Family Medicine

## 2019-02-12 DIAGNOSIS — M545 Low back pain, unspecified: Secondary | ICD-10-CM

## 2019-02-12 DIAGNOSIS — M542 Cervicalgia: Secondary | ICD-10-CM | POA: Diagnosis not present

## 2019-02-12 MED ORDER — CYCLOBENZAPRINE HCL 10 MG PO TABS: 10.0000 mg | ORAL_TABLET | Freq: Three times a day (TID) | ORAL | 1 refills | Status: DC | PRN

## 2019-02-12 MED ORDER — DIAZEPAM 10 MG PO TABS: ORAL_TABLET | ORAL | 0 refills | Status: DC

## 2019-02-12 MED ORDER — NABUMETONE 750 MG PO TABS: 750.0000 mg | ORAL_TABLET | Freq: Two times a day (BID) | ORAL | 6 refills | Status: DC | PRN
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
## 2019-03-12 ENCOUNTER — Encounter: Payer: Self-pay | Admitting: Family Medicine

## 2019-03-12 ENCOUNTER — Ambulatory Visit (INDEPENDENT_AMBULATORY_CARE_PROVIDER_SITE_OTHER): Payer: 59 | Admitting: Family Medicine

## 2019-03-12 VITALS — BP 150/112 | HR 81

## 2019-03-12 DIAGNOSIS — I10 Essential (primary) hypertension: Secondary | ICD-10-CM

## 2019-03-12 DIAGNOSIS — M545 Low back pain, unspecified: Secondary | ICD-10-CM

## 2019-03-12 DIAGNOSIS — M542 Cervicalgia: Secondary | ICD-10-CM | POA: Diagnosis not present
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
## 2019-04-01 ENCOUNTER — Telehealth: Payer: Self-pay | Admitting: Family Medicine

## 2019-04-01 NOTE — Telephone Encounter (Signed)
Patient called. He would like a refill on Amlodipine. He has only 3 left. His call back number is [phone]

## 2019-04-02 ENCOUNTER — Other Ambulatory Visit: Payer: Self-pay | Admitting: Surgical

## 2019-04-02 MED ORDER — AMLODIPINE BESYLATE 5 MG PO TABS: 5.0000 mg | ORAL_TABLET | Freq: Every day | ORAL | 0 refills | Status: DC

## 2019-04-02 NOTE — Telephone Encounter (Signed)
Patient called about his refill on his blood pressure medication.  He stated that the pharmacy has not received it yet and he will be out in the morning.  Thank you.

## 2019-04-02 NOTE — Telephone Encounter (Signed)
Can you advise? Dr Hilts out of office.  

## 2019-04-02 NOTE — Telephone Encounter (Signed)
I advised patient done.
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
## 2019-05-07 ENCOUNTER — Ambulatory Visit (INDEPENDENT_AMBULATORY_CARE_PROVIDER_SITE_OTHER): Payer: 59 | Admitting: Family Medicine

## 2019-05-07 ENCOUNTER — Other Ambulatory Visit: Payer: Self-pay

## 2019-05-07 ENCOUNTER — Encounter: Payer: Self-pay | Admitting: Family Medicine

## 2019-05-07 ENCOUNTER — Ambulatory Visit: Payer: Self-pay

## 2019-05-07 VITALS — BP 148/101 | HR 81

## 2019-05-07 DIAGNOSIS — M545 Low back pain, unspecified: Secondary | ICD-10-CM

## 2019-05-07 DIAGNOSIS — M542 Cervicalgia: Secondary | ICD-10-CM | POA: Diagnosis not present
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
## 2019-05-18 ENCOUNTER — Telehealth: Payer: Self-pay | Admitting: Family Medicine

## 2019-05-18 MED ORDER — AMLODIPINE BESYLATE 5 MG PO TABS: 5.0000 mg | ORAL_TABLET | Freq: Every day | ORAL | 6 refills | Status: DC

## 2019-05-18 NOTE — Telephone Encounter (Signed)
Sent!

## 2019-05-18 NOTE — Telephone Encounter (Signed)
Amlodipine. Please advise.

## 2019-05-18 NOTE — Telephone Encounter (Signed)
Patient called and stated that blood pressure medication was not called in and patient has been without medication for 2-3 days, Please call patient @[phone]-spouse [phone]

## 2019-05-18 NOTE — Telephone Encounter (Signed)
I called the patient and advised him his Amlodipine Rx has been sent to [facility] [address].

## 2019-06-23 ENCOUNTER — Ambulatory Visit (INDEPENDENT_AMBULATORY_CARE_PROVIDER_SITE_OTHER): Payer: 59 | Admitting: Family Medicine

## 2019-06-23 ENCOUNTER — Ambulatory Visit: Payer: 59 | Admitting: Family Medicine

## 2019-06-23 ENCOUNTER — Encounter: Payer: Self-pay | Admitting: Family Medicine

## 2019-06-23 ENCOUNTER — Other Ambulatory Visit: Payer: Self-pay

## 2019-06-23 VITALS — BP 156/105 | HR 85 | Ht 70.0 in | Wt 243.0 lb

## 2019-06-23 DIAGNOSIS — I10 Essential (primary) hypertension: Secondary | ICD-10-CM | POA: Diagnosis not present

## 2019-06-23 DIAGNOSIS — M542 Cervicalgia: Secondary | ICD-10-CM

## 2019-06-23 DIAGNOSIS — G8929 Other chronic pain: Secondary | ICD-10-CM

## 2019-06-23 DIAGNOSIS — G4700 Insomnia, unspecified: Secondary | ICD-10-CM | POA: Diagnosis not present

## 2019-06-23 DIAGNOSIS — M5442 Lumbago with sciatica, left side: Secondary | ICD-10-CM

## 2019-06-23 NOTE — Patient Instructions (Signed)
     L-Theanine:  100-200 mg one hour before bed time  Phosphatidylserine:  300-600 mg one hour before bed time  Melatonin:  2-6 mg one hour before bed time  Magnesium:  400 mg one hour before bed time      
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
## 2019-07-19 ENCOUNTER — Telehealth: Payer: Self-pay | Admitting: Family Medicine

## 2019-07-19 NOTE — Telephone Encounter (Signed)
Patient called.   Hilts is his PCP. He thinks he may have been exposed to COVID and wants Hilts advice.   Call back number: [phone]
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
## 2019-08-25 ENCOUNTER — Emergency Department (HOSPITAL_COMMUNITY): Payer: 59 | Admitting: Registered Nurse

## 2019-08-25 ENCOUNTER — Encounter (HOSPITAL_COMMUNITY): Admission: EM | Disposition: A | Discharge status: Home / Self Care | Payer: Self-pay | Source: Home / Self Care

## 2019-08-25 ENCOUNTER — Emergency Department (HOSPITAL_COMMUNITY): Payer: 59

## 2019-08-25 ENCOUNTER — Encounter (HOSPITAL_COMMUNITY): Payer: Self-pay | Admitting: Surgery

## 2019-08-25 ENCOUNTER — Inpatient Hospital Stay (HOSPITAL_COMMUNITY): Payer: 59

## 2019-08-25 ENCOUNTER — Inpatient Hospital Stay (HOSPITAL_COMMUNITY)
Admission: EM | Admit: 2019-08-25 | Discharge: 2019-08-31 | DRG: 981 | Disposition: A | Discharge status: Home / Self Care | Payer: 59 | Attending: Physician Assistant | Admitting: Physician Assistant

## 2019-08-25 DIAGNOSIS — S36115A Moderate laceration of liver, initial encounter: Secondary | ICD-10-CM

## 2019-08-25 DIAGNOSIS — S37031A Laceration of right kidney, unspecified degree, initial encounter: Secondary | ICD-10-CM | POA: Diagnosis present

## 2019-08-25 DIAGNOSIS — E669 Obesity, unspecified: Secondary | ICD-10-CM | POA: Diagnosis present

## 2019-08-25 DIAGNOSIS — R319 Hematuria, unspecified: Secondary | ICD-10-CM | POA: Diagnosis present

## 2019-08-25 DIAGNOSIS — S31630A Puncture wound without foreign body of abdominal wall, right upper quadrant with penetration into peritoneal cavity, initial encounter: Secondary | ICD-10-CM | POA: Diagnosis present

## 2019-08-25 DIAGNOSIS — D696 Thrombocytopenia, unspecified: Secondary | ICD-10-CM | POA: Diagnosis not present

## 2019-08-25 DIAGNOSIS — W3400XA Accidental discharge from unspecified firearms or gun, initial encounter: Secondary | ICD-10-CM | POA: Diagnosis not present

## 2019-08-25 DIAGNOSIS — S31139A Puncture wound of abdominal wall without foreign body, unspecified quadrant without penetration into peritoneal cavity, initial encounter: Secondary | ICD-10-CM

## 2019-08-25 DIAGNOSIS — R40241 Glasgow coma scale score 13-15, unspecified time: Secondary | ICD-10-CM | POA: Diagnosis present

## 2019-08-25 DIAGNOSIS — Z6834 Body mass index (BMI) 34.0-34.9, adult: Secondary | ICD-10-CM

## 2019-08-25 DIAGNOSIS — I10 Essential (primary) hypertension: Secondary | ICD-10-CM | POA: Diagnosis present

## 2019-08-25 DIAGNOSIS — Z20822 Contact with and (suspected) exposure to covid-19: Secondary | ICD-10-CM | POA: Diagnosis present

## 2019-08-25 DIAGNOSIS — D62 Acute posthemorrhagic anemia: Secondary | ICD-10-CM | POA: Diagnosis present

## 2019-08-25 DIAGNOSIS — N179 Acute kidney failure, unspecified: Secondary | ICD-10-CM | POA: Diagnosis not present

## 2019-08-25 DIAGNOSIS — Z880 Allergy status to penicillin: Secondary | ICD-10-CM

## 2019-08-25 DIAGNOSIS — S21231A Puncture wound without foreign body of right back wall of thorax without penetration into thoracic cavity, initial encounter: Secondary | ICD-10-CM | POA: Diagnosis present

## 2019-08-25 DIAGNOSIS — S31031A Puncture wound without foreign body of lower back and pelvis with penetration into retroperitoneum, initial encounter: Secondary | ICD-10-CM | POA: Diagnosis present

## 2019-08-25 DIAGNOSIS — R61 Generalized hyperhidrosis: Secondary | ICD-10-CM | POA: Diagnosis present

## 2019-08-25 DIAGNOSIS — R578 Other shock: Secondary | ICD-10-CM | POA: Diagnosis present

## 2019-08-25 DIAGNOSIS — S36113A Laceration of liver, unspecified degree, initial encounter: Secondary | ICD-10-CM | POA: Diagnosis present

## 2019-08-25 DIAGNOSIS — K661 Hemoperitoneum: Secondary | ICD-10-CM | POA: Diagnosis present

## 2019-08-25 DIAGNOSIS — Z9889 Other specified postprocedural states: Secondary | ICD-10-CM

## 2019-08-25 DIAGNOSIS — K219 Gastro-esophageal reflux disease without esophagitis: Secondary | ICD-10-CM | POA: Diagnosis present

## 2019-08-25 DIAGNOSIS — R140 Abdominal distension (gaseous): Secondary | ICD-10-CM | POA: Diagnosis not present

## 2019-08-25 HISTORY — DX: Essential (primary) hypertension: I10

## 2019-08-25 HISTORY — PX: LAPAROTOMY: SHX154

## 2019-08-25 LAB — COMPREHENSIVE METABOLIC PANEL
ALT: 65 U/L — ABNORMAL HIGH (ref 0–44)
AST: 61 U/L — ABNORMAL HIGH (ref 15–41)
Albumin: 3.8 g/dL (ref 3.5–5.0)
Alkaline Phosphatase: 60 U/L (ref 38–126)
Anion gap: 17 — ABNORMAL HIGH (ref 5–15)
BUN: 13 mg/dL (ref 6–20)
CO2: 17 mmol/L — ABNORMAL LOW (ref 22–32)
Calcium: 8.6 mg/dL — ABNORMAL LOW (ref 8.9–10.3)
Chloride: 105 mmol/L (ref 98–111)
Creatinine, Ser: 1.7 mg/dL — ABNORMAL HIGH (ref 0.61–1.24)
GFR calc Af Amer: 58 mL/min — ABNORMAL LOW (ref >60)
GFR calc non Af Amer: 50 mL/min — ABNORMAL LOW (ref >60)
Glucose, Bld: 169 mg/dL — ABNORMAL HIGH (ref 70–99)
Potassium: 3.5 mmol/L (ref 3.5–5.1)
Sodium: 139 mmol/L (ref 135–145)
Total Bilirubin: 0.7 mg/dL (ref 0.3–1.2)
Total Protein: 5.9 g/dL — ABNORMAL LOW (ref 6.5–8.1)

## 2019-08-25 LAB — POCT I-STAT 7, (LYTES, BLD GAS, ICA,H+H)
Acid-base deficit: 5 mmol/L — ABNORMAL HIGH (ref 0.0–2.0)
Acid-base deficit: 5 mmol/L — ABNORMAL HIGH (ref 0.0–2.0)
Acid-base deficit: 3 mmol/L — ABNORMAL HIGH (ref 0.0–2.0)
Bicarbonate: 21.8 mmol/L (ref 20.0–28.0)
Bicarbonate: 23.5 mmol/L (ref 20.0–28.0)
Bicarbonate: 23.3 mmol/L (ref 20.0–28.0)
Calcium, Ion: 1.12 mmol/L — ABNORMAL LOW (ref 1.15–1.40)
Calcium, Ion: 1.12 mmol/L — ABNORMAL LOW (ref 1.15–1.40)
Calcium, Ion: 1.13 mmol/L — ABNORMAL LOW (ref 1.15–1.40)
HCT: 35 % — ABNORMAL LOW (ref 39.0–52.0)
HCT: 37 % — ABNORMAL LOW (ref 39.0–52.0)
HCT: 32 % — ABNORMAL LOW (ref 39.0–52.0)
Hemoglobin: 11.9 g/dL — ABNORMAL LOW (ref 13.0–17.0)
Hemoglobin: 12.6 g/dL — ABNORMAL LOW (ref 13.0–17.0)
Hemoglobin: 10.9 g/dL — ABNORMAL LOW (ref 13.0–17.0)
O2 Saturation: 98 %
O2 Saturation: 99 %
O2 Saturation: 100 %
Patient temperature: 35
Patient temperature: 35.6
Patient temperature: 35.3
Potassium: 3.6 mmol/L (ref 3.5–5.1)
Potassium: 3.8 mmol/L (ref 3.5–5.1)
Potassium: 3.9 mmol/L (ref 3.5–5.1)
Sodium: 140 mmol/L (ref 135–145)
Sodium: 141 mmol/L (ref 135–145)
Sodium: 141 mmol/L (ref 135–145)
TCO2: 23 mmol/L (ref 22–32)
TCO2: 25 mmol/L (ref 22–32)
TCO2: 25 mmol/L (ref 22–32)
pCO2 arterial: 44.4 mmHg (ref 32.0–48.0)
pCO2 arterial: 53.2 mmHg — ABNORMAL HIGH (ref 32.0–48.0)
pCO2 arterial: 44.9 mmHg (ref 32.0–48.0)
pH, Arterial: 7.241 — ABNORMAL LOW (ref 7.350–7.450)
pH, Arterial: 7.293 — ABNORMAL LOW (ref 7.350–7.450)
pH, Arterial: 7.314 — ABNORMAL LOW (ref 7.350–7.450)
pO2, Arterial: 108 mmHg (ref 83.0–108.0)
pO2, Arterial: 163 mmHg — ABNORMAL HIGH (ref 83.0–108.0)
pO2, Arterial: 184 mmHg — ABNORMAL HIGH (ref 83.0–108.0)

## 2019-08-25 LAB — I-STAT CHEM 8, ED
BUN: 17 mg/dL (ref 6–20)
Calcium, Ion: 1.03 mmol/L — ABNORMAL LOW (ref 1.15–1.40)
Chloride: 104 mmol/L (ref 98–111)
Creatinine, Ser: 1.6 mg/dL — ABNORMAL HIGH (ref 0.61–1.24)
Glucose, Bld: 163 mg/dL — ABNORMAL HIGH (ref 70–99)
HCT: 47 % (ref 39.0–52.0)
Hemoglobin: 16 g/dL (ref 13.0–17.0)
Potassium: 4 mmol/L (ref 3.5–5.1)
Sodium: 138 mmol/L (ref 135–145)
TCO2: 20 mmol/L — ABNORMAL LOW (ref 22–32)

## 2019-08-25 LAB — CBC
HCT: 46.8 % (ref 39.0–52.0)
Hemoglobin: 15.4 g/dL (ref 13.0–17.0)
MCH: 33.4 pg (ref 26.0–34.0)
MCHC: 32.9 g/dL (ref 30.0–36.0)
MCV: 101.5 fL — ABNORMAL HIGH (ref 80.0–100.0)
Platelets: 131 10*3/uL — ABNORMAL LOW (ref 150–400)
RBC: 4.61 MIL/uL (ref 4.22–5.81)
RDW: 13.1 % (ref 11.5–15.5)
WBC: 7.3 10*3/uL (ref 4.0–10.5)
nRBC: 0 % (ref 0.0–0.2)

## 2019-08-25 LAB — ABO/RH: ABO/RH(D): B POS

## 2019-08-25 LAB — PROTIME-INR
INR: 1.1 (ref 0.8–1.2)
Prothrombin Time: 13.9 seconds (ref 11.4–15.2)

## 2019-08-25 LAB — RESPIRATORY PANEL BY RT PCR (FLU A&B, COVID)
Influenza A by PCR: NEGATIVE
Influenza B by PCR: NEGATIVE
SARS Coronavirus 2 by RT PCR: NEGATIVE

## 2019-08-25 LAB — LACTIC ACID, PLASMA: Lactic Acid, Venous: 7.6 mmol/L (ref 0.5–1.9)

## 2019-08-25 LAB — CDS SEROLOGY

## 2019-08-25 LAB — ETHANOL: Alcohol, Ethyl (B): <10 mg/dL (ref <10)

## 2019-08-25 SURGERY — LAPAROTOMY, EXPLORATORY
Anesthesia: General | Site: Abdomen

## 2019-08-25 MED ORDER — ALBUMIN HUMAN 5 % IV SOLN: INTRAVENOUS | Status: DC | PRN

## 2019-08-25 MED ORDER — SUGAMMADEX SODIUM 200 MG/2ML IV SOLN
INTRAVENOUS | Status: DC | PRN
  Administered 2019-08-25: 300 mg via INTRAVENOUS

## 2019-08-25 MED ORDER — PHENYLEPHRINE 40 MCG/ML (10ML) SYRINGE FOR IV PUSH (FOR BLOOD PRESSURE SUPPORT)
PREFILLED_SYRINGE | INTRAVENOUS | Status: DC | PRN
  Administered 2019-08-25 (×3): 80 ug via INTRAVENOUS
  Administered 2019-08-25 (×2): 120 ug via INTRAVENOUS

## 2019-08-25 MED ORDER — HEMOSTATIC AGENTS (NO CHARGE) OPTIME
TOPICAL | Status: DC | PRN
  Administered 2019-08-25: 3 via TOPICAL

## 2019-08-25 MED ORDER — CEFAZOLIN SODIUM-DEXTROSE 2-3 GM-%(50ML) IV SOLR
INTRAVENOUS | Status: DC | PRN
  Administered 2019-08-25: 2 g via INTRAVENOUS

## 2019-08-25 MED ORDER — SODIUM CHLORIDE 0.9% IV SOLUTION: Freq: Once | INTRAVENOUS | Status: DC

## 2019-08-25 MED ORDER — PROMETHAZINE HCL 25 MG/ML IJ SOLN: 6.2500 mg | INTRAMUSCULAR | Status: DC | PRN

## 2019-08-25 MED ORDER — LIDOCAINE 2% (20 MG/ML) 5 ML SYRINGE
INTRAMUSCULAR | Status: AC
  Filled 2019-08-25: qty 15

## 2019-08-25 MED ORDER — HYDROMORPHONE HCL 1 MG/ML IJ SOLN: 0.2500 mg | INTRAMUSCULAR | Status: DC | PRN

## 2019-08-25 MED ORDER — CEFAZOLIN SODIUM 1 G IJ SOLR
INTRAMUSCULAR | Status: AC
  Filled 2019-08-25: qty 40

## 2019-08-25 MED ORDER — LACTATED RINGERS IV SOLN: INTRAVENOUS | Status: DC | PRN

## 2019-08-25 MED ORDER — STERILE WATER FOR IRRIGATION IR SOLN
Status: DC | PRN
  Administered 2019-08-25: 2000 mL

## 2019-08-25 MED ORDER — SODIUM CHLORIDE 0.9 % IV SOLN: INTRAVENOUS | Status: DC | PRN

## 2019-08-25 MED ORDER — SUCCINYLCHOLINE CHLORIDE 200 MG/10ML IV SOSY
PREFILLED_SYRINGE | INTRAVENOUS | Status: AC
  Filled 2019-08-25: qty 20

## 2019-08-25 MED ORDER — ETOMIDATE 2 MG/ML IV SOLN
INTRAVENOUS | Status: DC | PRN
  Administered 2019-08-25: 20 mg via INTRAVENOUS

## 2019-08-25 MED ORDER — PHENYLEPHRINE 40 MCG/ML (10ML) SYRINGE FOR IV PUSH (FOR BLOOD PRESSURE SUPPORT)
PREFILLED_SYRINGE | INTRAVENOUS | Status: AC
  Filled 2019-08-25: qty 40

## 2019-08-25 MED ORDER — HYDROMORPHONE HCL 1 MG/ML IJ SOLN
INTRAMUSCULAR | Status: DC | PRN
  Administered 2019-08-25 (×2): .5 mg via INTRAVENOUS

## 2019-08-25 MED ORDER — MIDAZOLAM HCL 5 MG/5ML IJ SOLN
INTRAMUSCULAR | Status: DC | PRN
  Administered 2019-08-25: 2 mg via INTRAVENOUS

## 2019-08-25 MED ORDER — EPHEDRINE 5 MG/ML INJ
INTRAVENOUS | Status: AC
  Filled 2019-08-25: qty 10

## 2019-08-25 MED ORDER — FENTANYL CITRATE (PF) 250 MCG/5ML IJ SOLN
INTRAMUSCULAR | Status: DC | PRN
  Administered 2019-08-25 (×5): 50 ug via INTRAVENOUS

## 2019-08-25 MED ORDER — SUCCINYLCHOLINE CHLORIDE 200 MG/10ML IV SOSY
PREFILLED_SYRINGE | INTRAVENOUS | Status: DC | PRN
  Administered 2019-08-25: 120 mg via INTRAVENOUS

## 2019-08-25 MED ORDER — METRONIDAZOLE IN NACL 5-0.79 MG/ML-% IV SOLN: 500.0000 mg | Freq: Once | INTRAVENOUS | Status: DC

## 2019-08-25 MED ORDER — ROCURONIUM BROMIDE 10 MG/ML (PF) SYRINGE
PREFILLED_SYRINGE | INTRAVENOUS | Status: AC
  Filled 2019-08-25: qty 40

## 2019-08-25 MED ORDER — HYDROMORPHONE HCL 1 MG/ML IJ SOLN
INTRAMUSCULAR | Status: AC
  Filled 2019-08-25: qty 0.5

## 2019-08-25 MED ORDER — SODIUM CHLORIDE 0.9 % IV SOLN: 2.0000 g | Freq: Once | INTRAVENOUS | Status: DC

## 2019-08-25 MED ORDER — ROCURONIUM BROMIDE 10 MG/ML (PF) SYRINGE
PREFILLED_SYRINGE | INTRAVENOUS | Status: DC | PRN
  Administered 2019-08-25: 20 mg via INTRAVENOUS
  Administered 2019-08-25: 50 mg via INTRAVENOUS

## 2019-08-25 MED ORDER — PROPOFOL 10 MG/ML IV BOLUS
INTRAVENOUS | Status: AC
  Filled 2019-08-25: qty 20

## 2019-08-25 MED ORDER — ONDANSETRON HCL 4 MG/2ML IJ SOLN
INTRAMUSCULAR | Status: DC | PRN
  Administered 2019-08-25: 4 mg via INTRAVENOUS

## 2019-08-25 MED ORDER — 0.9 % SODIUM CHLORIDE (POUR BTL) OPTIME
TOPICAL | Status: DC | PRN
  Administered 2019-08-25: 4000 mL

## 2019-08-25 SURGICAL SUPPLY — 57 items
APL PRP STRL LF DISP 70% ISPRP (MISCELLANEOUS)
BIOPATCH RED 1 DISK 7.0 (GAUZE/BANDAGES/DRESSINGS) ×1 IMPLANT
BIOPATCH RED 1IN DISK 7.0MM (GAUZE/BANDAGES/DRESSINGS) ×1
BLADE CLIPPER SURG (BLADE) ×2 IMPLANT
CANISTER SUCT 3000ML PPV (MISCELLANEOUS) ×3 IMPLANT
CHLORAPREP W/TINT 26 (MISCELLANEOUS) ×1 IMPLANT
COVER SURGICAL LIGHT HANDLE (MISCELLANEOUS) ×5 IMPLANT
COVER WAND RF STERILE (DRAPES) ×1 IMPLANT
DRAIN CHANNEL 19F RND (DRAIN) ×2 IMPLANT
DRAPE LAPAROSCOPIC ABDOMINAL (DRAPES) ×3 IMPLANT
DRAPE WARM FLUID 44X44 (DRAPES) ×3 IMPLANT
DRSG MEPILEX BORDER 4X4 (GAUZE/BANDAGES/DRESSINGS) ×4 IMPLANT
DRSG OPSITE POSTOP 4X10 (GAUZE/BANDAGES/DRESSINGS) IMPLANT
DRSG OPSITE POSTOP 4X12 (GAUZE/BANDAGES/DRESSINGS) ×2 IMPLANT
DRSG OPSITE POSTOP 4X8 (GAUZE/BANDAGES/DRESSINGS) ×2 IMPLANT
ELECT BLADE 6.5 EXT (BLADE) ×2 IMPLANT
ELECT CAUTERY BLADE 6.4 (BLADE) ×4 IMPLANT
ELECT REM PT RETURN 9FT ADLT (ELECTROSURGICAL) ×3
ELECTRODE REM PT RTRN 9FT ADLT (ELECTROSURGICAL) ×1 IMPLANT
EVACUATOR SILICONE 100CC (DRAIN) ×2 IMPLANT
GAUZE 4X4 16PLY RFD (DISPOSABLE) ×2 IMPLANT
GAUZE SPONGE 4X4 12PLY STRL (GAUZE/BANDAGES/DRESSINGS) ×2 IMPLANT
GLOVE BIO SURGEON STRL SZ 6 (GLOVE) ×2 IMPLANT
GLOVE BIO SURGEON STRL SZ7 (GLOVE) ×7 IMPLANT
GLOVE BIO SURGEON STRL SZ7.5 (GLOVE) ×2 IMPLANT
GLOVE BIOGEL PI IND STRL 6.5 (GLOVE) IMPLANT
GLOVE BIOGEL PI IND STRL 7.5 (GLOVE) ×1 IMPLANT
GLOVE BIOGEL PI INDICATOR 6.5 (GLOVE) ×2
GLOVE BIOGEL PI INDICATOR 7.5 (GLOVE) ×4
GLOVE INDICATOR 6.5 STRL GRN (GLOVE) ×2 IMPLANT
GLOVE SURG SS PI 7.5 STRL IVOR (GLOVE) ×4 IMPLANT
GOWN STRL REUS W/ TWL LRG LVL3 (GOWN DISPOSABLE) ×2 IMPLANT
GOWN STRL REUS W/TWL LRG LVL3 (GOWN DISPOSABLE) ×6
HANDLE SUCTION POOLE (INSTRUMENTS) ×1 IMPLANT
KIT BASIN OR (CUSTOM PROCEDURE TRAY) ×3 IMPLANT
KIT TURNOVER KIT B (KITS) ×3 IMPLANT
LIGASURE IMPACT 36 18CM CVD LR (INSTRUMENTS) IMPLANT
NS IRRIG 1000ML POUR BTL (IV SOLUTION) ×8 IMPLANT
PACK GENERAL/GYN (CUSTOM PROCEDURE TRAY) ×3 IMPLANT
PAD ARMBOARD 7.5X6 YLW CONV (MISCELLANEOUS) ×3 IMPLANT
PENCIL SMOKE EVACUATOR (MISCELLANEOUS) ×3 IMPLANT
SPECIMEN JAR LARGE (MISCELLANEOUS) IMPLANT
SPONGE LAP 18X18 RF (DISPOSABLE) ×4 IMPLANT
STAPLER VISISTAT 35W (STAPLE) ×3 IMPLANT
SUCTION POOLE HANDLE (INSTRUMENTS) ×3
SUT ETHILON 2 0 FS 18 (SUTURE) ×2 IMPLANT
SUT PDS AB 1 TP1 96 (SUTURE) ×6 IMPLANT
SUT SILK 2 0 SH CR/8 (SUTURE) ×3 IMPLANT
SUT SILK 2 0 TIES 10X30 (SUTURE) ×3 IMPLANT
SUT SILK 3 0 SH CR/8 (SUTURE) ×3 IMPLANT
SUT SILK 3 0 TIES 10X30 (SUTURE) ×3 IMPLANT
SUT VIC AB 2-0 SH 27 (SUTURE) ×3
SUT VIC AB 2-0 SH 27X BRD (SUTURE) IMPLANT
SUT VIC AB 3-0 SH 18 (SUTURE) IMPLANT
TOWEL GREEN STERILE (TOWEL DISPOSABLE) ×3 IMPLANT
TRAY FOLEY MTR SLVR 16FR STAT (SET/KITS/TRAYS/PACK) ×2 IMPLANT
YANKAUER SUCT BULB TIP NO VENT (SUCTIONS) ×2 IMPLANT
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
## 2019-08-25 NOTE — Transfer of Care (Signed)
Immediate Anesthesia Transfer of Care Note  Patient: Gerald Phillips  Procedure(s) Performed: EXPLORATORY LAPAROTOMY, RENAL EXPLORATION CLOSURE OF RETROPERITONEUM (N/A Abdomen)  Patient Location: PACU  Anesthesia Type:General  Level of Consciousness: drowsy, patient cooperative and responds to stimulation  Airway & Oxygen Therapy: Patient Spontanous Breathing and Patient connected to nasal cannula oxygen  Post-op Assessment: Report given to RN and Post -op Vital signs reviewed and stable  Post vital signs: Reviewed and stable  Last Vitals:  Vitals Value Taken Time  BP 149/93   Temp    Pulse 100   Resp 16   SpO2 100     Last Pain:  Vitals:   08/25/19 2020  TempSrc:   PainSc: 10-Worst pain ever         Complications: No apparent anesthesia complications

## 2019-08-25 NOTE — Anesthesia Preprocedure Evaluation (Signed)
Anesthesia Evaluation  Patient identified by MRN, date of birth, ID band Patient awake    Reviewed: Allergy & Precautions, NPO status , Unable to perform ROS - Chart review onlyPreop documentation limited or incomplete due to emergent nature of procedure.  Airway Mallampati: III  TM Distance: >3 FB     Dental   Pulmonary    breath sounds clear to auscultation       Cardiovascular hypertension,  Rhythm:Regular Rate:Tachycardia     Neuro/Psych    GI/Hepatic Neg liver ROS,   Endo/Other  negative endocrine ROS  Renal/GU negative Renal ROS  negative genitourinary   Musculoskeletal negative musculoskeletal ROS (+)   Abdominal (+) + obese,   Peds negative pediatric ROS (+)  Hematology negative hematology ROS (+)   Anesthesia Other Findings   Reproductive/Obstetrics negative OB ROS                             Anesthesia Physical Anesthesia Plan  ASA: III and emergent  Anesthesia Plan: General   Post-op Pain Management:    Induction: Intravenous, Rapid sequence and Cricoid pressure planned  PONV Risk Score and Plan: 2 and Ondansetron and Dexamethasone  Airway Management Planned: Oral ETT  Additional Equipment: Arterial line and CVP  Intra-op Plan:   Post-operative Plan: Post-operative intubation/ventilation  Informed Consent:     History available from chart only and Only emergency history available  Plan Discussed with: CRNA and Anesthesiologist  Anesthesia Plan Comments:         Anesthesia Quick Evaluation
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
## 2019-08-25 NOTE — Anesthesia Postprocedure Evaluation (Signed)
Anesthesia Post Note  Patient: [facility]  Procedure(s) Performed: EXPLORATORY LAPAROTOMY, RENAL EXPLORATION CLOSURE OF RETROPERITONEUM (N/A Abdomen)     Patient location during evaluation: PACU Anesthesia Type: General Level of consciousness: sedated Pain management: pain level controlled Vital Signs Assessment: post-procedure vital signs reviewed and stable Respiratory status: spontaneous breathing and respiratory function stable Cardiovascular status: stable Postop Assessment: no apparent nausea or vomiting Anesthetic complications: no    Last Vitals:  Vitals:   08/25/19 2324 08/25/19 2330  BP: (!) 126/91   Pulse: 96 96  Resp: (!) 26 18  Temp: 36.4 C   SpO2: 98% 97%    Last Pain:  Vitals:   08/25/19 2324  TempSrc: Oral  PainSc:                  Christo Hain DANIEL
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
## 2019-08-25 NOTE — Anesthesia Procedure Notes (Signed)
Procedure Name: Intubation Date/Time: 08/25/2019 8:42 PM Performed by: Zollie Scale, CRNA Pre-anesthesia Checklist: Patient identified, Emergency Drugs available, Suction available and Patient being monitored Patient Re-evaluated:Patient Re-evaluated prior to induction Oxygen Delivery Method: Circle System Utilized Preoxygenation: Pre-oxygenation with 100% oxygen Induction Type: IV induction, Rapid sequence and Cricoid Pressure applied Laryngoscope Size: Glidescope and 4 Grade View: Grade I Tube type: Oral Tube size: 7.5 mm Number of attempts: 1 Airway Equipment and Method: Stylet and Video-laryngoscopy Placement Confirmation: ETT inserted through vocal cords under direct vision,  positive ETCO2 and breath sounds checked- equal and bilateral Secured at: 23 cm Tube secured with: Tape Dental Injury: Teeth and Oropharynx as per pre-operative assessment  Comments: Glidescope utilized d/t unknown COVID status.

## 2019-08-25 NOTE — Progress Notes (Signed)
This chaplain responded to phone page from the ED.  Dee requested Pt. Gerald Phillips [phone] be updated on the Pt. progress. Dee informed the chaplain the Pt is not XXX.
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
## 2019-08-25 NOTE — Anesthesia Procedure Notes (Signed)
Arterial Line Insertion Start/End2/17/2021 8:55 PM, 08/25/2019 8:57 PM Performed by: Zollie Scale, CRNA, CRNA  Patient location: OR. Emergency situation Left, radial was placed Catheter size: 20 G Hand hygiene performed  and Seldinger technique used Allen's test indicative of satisfactory collateral circulation Attempts: 1 Procedure performed using ultrasound guided technique. Following insertion, dressing applied and Biopatch. Post procedure assessment: normal  Patient tolerated the procedure well with no immediate complications. Additional procedure comments: Attempted by previous CRNA x 2.[name]

## 2019-08-25 NOTE — Anesthesia Procedure Notes (Signed)
Central Venous Catheter Insertion Performed by: Heather Roberts, MD, anesthesiologist Start/End2/17/2021 8:46 PM, 08/25/2019 8:56 PM Patient location: Pre-op. Preanesthetic checklist: patient identified, IV checked, site marked, risks and benefits discussed, surgical consent, monitors and equipment checked, pre-op evaluation, timeout performed and anesthesia consent Position: Trendelenburg Lidocaine 1% used for infiltration and patient sedated Hand hygiene performed , maximum sterile barriers used  and Seldinger technique used Catheter size: 8 Fr Total catheter length 16. Central line was placed.Double lumen Procedure performed using ultrasound guided technique. Ultrasound Notes:anatomy identified, needle tip was noted to be adjacent to the nerve/plexus identified, no ultrasound evidence of intravascular and/or intraneural injection and image(s) printed for medical record Attempts: 1 Following insertion, dressing applied, line sutured and Biopatch. Post procedure assessment: blood return through all ports, free fluid flow and no air  Patient tolerated the procedure well with no immediate complications.

## 2019-08-25 NOTE — ED Notes (Signed)
Taken to OR w/ Primary RN, NT

## 2019-08-26 LAB — CBC
HCT: 34.2 % — ABNORMAL LOW (ref 39.0–52.0)
HCT: 33.3 % — ABNORMAL LOW (ref 39.0–52.0)
Hemoglobin: 11.6 g/dL — ABNORMAL LOW (ref 13.0–17.0)
Hemoglobin: 11.8 g/dL — ABNORMAL LOW (ref 13.0–17.0)
MCH: 32.3 pg (ref 26.0–34.0)
MCH: 33.4 pg (ref 26.0–34.0)
MCHC: 33.9 g/dL (ref 30.0–36.0)
MCHC: 35.4 g/dL (ref 30.0–36.0)
MCV: 95.3 fL (ref 80.0–100.0)
MCV: 94.3 fL (ref 80.0–100.0)
Platelets: 91 10*3/uL — ABNORMAL LOW (ref 150–400)
Platelets: 135 10*3/uL — ABNORMAL LOW (ref 150–400)
RBC: 3.59 MIL/uL — ABNORMAL LOW (ref 4.22–5.81)
RBC: 3.53 MIL/uL — ABNORMAL LOW (ref 4.22–5.81)
RDW: 14.6 % (ref 11.5–15.5)
RDW: 15.1 % (ref 11.5–15.5)
WBC: 20 10*3/uL — ABNORMAL HIGH (ref 4.0–10.5)
WBC: 13.8 10*3/uL — ABNORMAL HIGH (ref 4.0–10.5)
nRBC: 0 % (ref 0.0–0.2)
nRBC: 0 % (ref 0.0–0.2)

## 2019-08-26 LAB — LACTIC ACID, PLASMA: Lactic Acid, Venous: 2.1 mmol/L (ref 0.5–1.9)

## 2019-08-26 LAB — PROTIME-INR
INR: 1.3 — ABNORMAL HIGH (ref 0.8–1.2)
Prothrombin Time: 15.8 seconds — ABNORMAL HIGH (ref 11.4–15.2)

## 2019-08-26 LAB — MRSA PCR SCREENING: MRSA by PCR: NEGATIVE

## 2019-08-26 LAB — BLOOD PRODUCT ORDER (VERBAL) VERIFICATION

## 2019-08-26 LAB — HIV ANTIBODY (ROUTINE TESTING W REFLEX): HIV Screen 4th Generation wRfx: NONREACTIVE

## 2019-08-26 MED ORDER — METOPROLOL TARTRATE 5 MG/5ML IV SOLN
5.0000 mg | Freq: Four times a day (QID) | INTRAVENOUS | Status: DC
  Administered 2019-08-26 – 2019-08-30 (×15): 5 mg via INTRAVENOUS
  Filled 2019-08-26 (×16): qty 5

## 2019-08-26 MED ORDER — HYDRALAZINE HCL 10 MG PO TABS
10.0000 mg | ORAL_TABLET | Freq: Four times a day (QID) | ORAL | Status: DC | PRN
  Administered 2019-08-26 – 2019-08-30 (×3): 10 mg via ORAL
  Filled 2019-08-26 (×4): qty 1

## 2019-08-26 MED ORDER — HYDROMORPHONE HCL 1 MG/ML IJ SOLN
1.0000 mg | INTRAMUSCULAR | Status: DC | PRN
  Administered 2019-08-26 – 2019-08-27 (×8): 1 mg via INTRAVENOUS
  Filled 2019-08-26 (×8): qty 1

## 2019-08-26 MED ORDER — ONDANSETRON 4 MG PO TBDP: 4.0000 mg | ORAL_TABLET | Freq: Four times a day (QID) | ORAL | Status: DC | PRN

## 2019-08-26 MED ORDER — PHENYLEPHRINE HCL-NACL 10-0.9 MG/250ML-% IV SOLN
INTRAVENOUS | Status: AC
  Filled 2019-08-26: qty 250

## 2019-08-26 MED ORDER — CHLORHEXIDINE GLUCONATE CLOTH 2 % EX PADS
6.0000 | MEDICATED_PAD | Freq: Every day | CUTANEOUS | Status: DC
  Administered 2019-08-26 – 2019-08-30 (×3): 6 via TOPICAL

## 2019-08-26 MED ORDER — POTASSIUM CHLORIDE IN NACL 20-0.9 MEQ/L-% IV SOLN
INTRAVENOUS | Status: DC
  Filled 2019-08-26 (×2): qty 1000

## 2019-08-26 MED ORDER — HYDROMORPHONE HCL 1 MG/ML IJ SOLN
0.5000 mg | INTRAMUSCULAR | Status: DC | PRN
  Administered 2019-08-26: 0.5 mg via INTRAVENOUS
  Filled 2019-08-26: qty 1

## 2019-08-26 MED ORDER — OXYCODONE HCL 5 MG PO TABS
5.0000 mg | ORAL_TABLET | ORAL | Status: DC | PRN
  Administered 2019-08-26 – 2019-08-31 (×14): 10 mg via ORAL
  Filled 2019-08-26 (×14): qty 2

## 2019-08-26 MED ORDER — METHOCARBAMOL 1000 MG/10ML IJ SOLN
1000.0000 mg | Freq: Three times a day (TID) | INTRAVENOUS | Status: DC | PRN
  Filled 2019-08-26 (×2): qty 10

## 2019-08-26 MED ORDER — ONDANSETRON HCL 4 MG/2ML IJ SOLN
4.0000 mg | Freq: Four times a day (QID) | INTRAMUSCULAR | Status: DC | PRN
  Administered 2019-08-26 – 2019-08-27 (×3): 4 mg via INTRAVENOUS
  Filled 2019-08-26 (×3): qty 2
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
## 2019-08-26 NOTE — Progress Notes (Signed)
Patient transferring to 4n01. Report given to Amy RN.
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
## 2019-08-27 ENCOUNTER — Inpatient Hospital Stay (HOSPITAL_COMMUNITY): Payer: 59

## 2019-08-27 LAB — BASIC METABOLIC PANEL
Anion gap: 9 (ref 5–15)
Anion gap: 8 (ref 5–15)
BUN: 19 mg/dL (ref 6–20)
BUN: 16 mg/dL (ref 6–20)
CO2: 25 mmol/L (ref 22–32)
CO2: 25 mmol/L (ref 22–32)
Calcium: 8.4 mg/dL — ABNORMAL LOW (ref 8.9–10.3)
Calcium: 8.6 mg/dL — ABNORMAL LOW (ref 8.9–10.3)
Chloride: 105 mmol/L (ref 98–111)
Chloride: 105 mmol/L (ref 98–111)
Creatinine, Ser: 1.78 mg/dL — ABNORMAL HIGH (ref 0.61–1.24)
Creatinine, Ser: 1.76 mg/dL — ABNORMAL HIGH (ref 0.61–1.24)
GFR calc Af Amer: 55 mL/min — ABNORMAL LOW (ref >60)
GFR calc Af Amer: 56 mL/min — ABNORMAL LOW (ref >60)
GFR calc non Af Amer: 47 mL/min — ABNORMAL LOW (ref >60)
GFR calc non Af Amer: 48 mL/min — ABNORMAL LOW (ref >60)
Glucose, Bld: 121 mg/dL — ABNORMAL HIGH (ref 70–99)
Glucose, Bld: 106 mg/dL — ABNORMAL HIGH (ref 70–99)
Potassium: 4.9 mmol/L (ref 3.5–5.1)
Potassium: 4.4 mmol/L (ref 3.5–5.1)
Sodium: 139 mmol/L (ref 135–145)
Sodium: 138 mmol/L (ref 135–145)

## 2019-08-27 LAB — BPAM FFP
Blood Product Expiration Date: 202102222359
Blood Product Expiration Date: 202102222359
Blood Product Expiration Date: 202102222359
Blood Product Expiration Date: 202102222359
Blood Product Expiration Date: 202102222359
Blood Product Expiration Date: 202102222359
Blood Product Expiration Date: 202102222359
Blood Product Expiration Date: 202102222359
Blood Product Expiration Date: 202102222359
ISSUE DATE / TIME: 202102172107
ISSUE DATE / TIME: 202102172117
ISSUE DATE / TIME: 202102172322
ISSUE DATE / TIME: 202102180846
ISSUE DATE / TIME: 202102180846
ISSUE DATE / TIME: 202102180846
ISSUE DATE / TIME: 202102180846
Unit Type and Rh: 7300
Unit Type and Rh: 7300
Unit Type and Rh: 7300
Unit Type and Rh: 7300
Unit Type and Rh: 7300
Unit Type and Rh: 7300
Unit Type and Rh: 7300
Unit Type and Rh: 7300
Unit Type and Rh: 7300

## 2019-08-27 LAB — PREPARE FRESH FROZEN PLASMA
Unit division: 0
Unit division: 0
Unit division: 0
Unit division: 0

## 2019-08-27 LAB — CBC
HCT: 29.7 % — ABNORMAL LOW (ref 39.0–52.0)
Hemoglobin: 10 g/dL — ABNORMAL LOW (ref 13.0–17.0)
MCH: 32.7 pg (ref 26.0–34.0)
MCHC: 33.7 g/dL (ref 30.0–36.0)
MCV: 97.1 fL (ref 80.0–100.0)
Platelets: 83 10*3/uL — ABNORMAL LOW (ref 150–400)
RBC: 3.06 MIL/uL — ABNORMAL LOW (ref 4.22–5.81)
RDW: 15.1 % (ref 11.5–15.5)
WBC: 16.6 10*3/uL — ABNORMAL HIGH (ref 4.0–10.5)
nRBC: 0 % (ref 0.0–0.2)

## 2019-08-27 LAB — HEMOGLOBIN AND HEMATOCRIT, BLOOD
HCT: 29.1 % — ABNORMAL LOW (ref 39.0–52.0)
Hemoglobin: 9.5 g/dL — ABNORMAL LOW (ref 13.0–17.0)

## 2019-08-27 MED ORDER — PANTOPRAZOLE SODIUM 40 MG PO TBEC
40.0000 mg | DELAYED_RELEASE_TABLET | Freq: Every day | ORAL | Status: DC
  Administered 2019-08-27 – 2019-08-28 (×2): 40 mg via ORAL
  Filled 2019-08-27 (×2): qty 1

## 2019-08-27 MED ORDER — IOHEXOL 350 MG/ML SOLN
100.0000 mL | Freq: Once | INTRAVENOUS | Status: AC | PRN
  Administered 2019-08-27: 100 mL via INTRAVENOUS

## 2019-08-27 MED ORDER — SODIUM CHLORIDE 0.9 % IV SOLN: INTRAVENOUS | Status: DC
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
## 2019-08-27 NOTE — Progress Notes (Signed)
Rehab Admissions Coordinator Note:  Per PT recommendation, patient was screened by Stephania Fragmin for appropriateness for an Inpatient Acute Rehab Consult.  Pt may progress quickly and not require CIR stay.  Will follow for next therapy session before requesting an order.   Stephania Fragmin 08/27/2019, 12:57 PM  I can be reached at [phone].
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
## 2019-08-27 NOTE — Addendum Note (Signed)
Addendum  created 08/27/19 0949 by Sonda Primes, CRNA   Order list changed
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
## 2019-08-27 NOTE — Progress Notes (Signed)
Spoke with Environmental education officer via secure chat and the patient has a working PIV and she is aware of the DC central line order

## 2019-08-27 NOTE — Progress Notes (Signed)
Responded to consult to check R IJ CVC. On assessment, sutures are intact with no signs that line has been pulled back. Brown lumen flushes easily with brisk blood return. White lumen flushes easily with no blood return, when flushed with NS, clear fluid leaks from exit site. No edema, redness, or bruising present. New dressing placed and line capped. Shared findings with RN assisting primary. Recommended discussion with physician regarding discontinuing/replacing line. RN in agreement.
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
## 2019-08-28 LAB — CBC
HCT: 26.7 % — ABNORMAL LOW (ref 39.0–52.0)
HCT: 27.1 % — ABNORMAL LOW (ref 39.0–52.0)
Hemoglobin: 9.1 g/dL — ABNORMAL LOW (ref 13.0–17.0)
Hemoglobin: 9.3 g/dL — ABNORMAL LOW (ref 13.0–17.0)
MCH: 33.3 pg (ref 26.0–34.0)
MCH: 33.3 pg (ref 26.0–34.0)
MCHC: 34.1 g/dL (ref 30.0–36.0)
MCHC: 34.3 g/dL (ref 30.0–36.0)
MCV: 97.8 fL (ref 80.0–100.0)
MCV: 97.1 fL (ref 80.0–100.0)
Platelets: 86 10*3/uL — ABNORMAL LOW (ref 150–400)
Platelets: 95 10*3/uL — ABNORMAL LOW (ref 150–400)
RBC: 2.73 MIL/uL — ABNORMAL LOW (ref 4.22–5.81)
RBC: 2.79 MIL/uL — ABNORMAL LOW (ref 4.22–5.81)
RDW: 14.6 % (ref 11.5–15.5)
RDW: 14.3 % (ref 11.5–15.5)
WBC: 15.7 10*3/uL — ABNORMAL HIGH (ref 4.0–10.5)
WBC: 15.9 10*3/uL — ABNORMAL HIGH (ref 4.0–10.5)
nRBC: 0 % (ref 0.0–0.2)
nRBC: 0 % (ref 0.0–0.2)

## 2019-08-28 LAB — BASIC METABOLIC PANEL
Anion gap: 8 (ref 5–15)
BUN: 18 mg/dL (ref 6–20)
CO2: 27 mmol/L (ref 22–32)
Calcium: 8.5 mg/dL — ABNORMAL LOW (ref 8.9–10.3)
Chloride: 103 mmol/L (ref 98–111)
Creatinine, Ser: 1.46 mg/dL — ABNORMAL HIGH (ref 0.61–1.24)
GFR calc Af Amer: >60 mL/min (ref >60)
GFR calc non Af Amer: >60 mL/min (ref >60)
Glucose, Bld: 126 mg/dL — ABNORMAL HIGH (ref 70–99)
Potassium: 4.1 mmol/L (ref 3.5–5.1)
Sodium: 138 mmol/L (ref 135–145)

## 2019-08-28 MED ORDER — AMLODIPINE BESYLATE 5 MG PO TABS
5.0000 mg | ORAL_TABLET | Freq: Every day | ORAL | Status: DC
  Administered 2019-08-28 – 2019-08-31 (×4): 5 mg via ORAL
  Filled 2019-08-28 (×4): qty 1

## 2019-08-28 MED ORDER — PHENAZOPYRIDINE HCL 200 MG PO TABS
200.0000 mg | ORAL_TABLET | Freq: Three times a day (TID) | ORAL | Status: DC
  Administered 2019-08-28 – 2019-08-30 (×5): 200 mg via ORAL
  Filled 2019-08-28 (×7): qty 1
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
## 2019-08-28 NOTE — Progress Notes (Addendum)
Pt JP drain total output this shift: many clots and bloody. MD aware of significant increase in output.
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
## 2019-08-29 LAB — BPAM RBC
Blood Product Expiration Date: 202102242359
Blood Product Expiration Date: 202103032359
Blood Product Expiration Date: 202103132359
Blood Product Expiration Date: 202103142359
Blood Product Expiration Date: 202103152359
Blood Product Expiration Date: 202103232359
Blood Product Expiration Date: 202103232359
Blood Product Expiration Date: 202103232359
Blood Product Expiration Date: 202103232359
Blood Product Expiration Date: 202103232359
Blood Product Expiration Date: 202103232359
Blood Product Expiration Date: 202103232359
Blood Product Expiration Date: 202103232359
ISSUE DATE / TIME: 202102172020
ISSUE DATE / TIME: 202102172044
ISSUE DATE / TIME: 202102172044
ISSUE DATE / TIME: 202102172044
ISSUE DATE / TIME: 202102172109
ISSUE DATE / TIME: 202102172109
ISSUE DATE / TIME: 202102172109
ISSUE DATE / TIME: 202102180710
ISSUE DATE / TIME: 202102180710
ISSUE DATE / TIME: 202102181026
ISSUE DATE / TIME: 202102181212
ISSUE DATE / TIME: 202102182248
ISSUE DATE / TIME: 202102191958
Unit Type and Rh: 5100
Unit Type and Rh: 7300
Unit Type and Rh: 7300
Unit Type and Rh: 7300
Unit Type and Rh: 7300
Unit Type and Rh: 7300
Unit Type and Rh: 7300
Unit Type and Rh: 7300
Unit Type and Rh: 7300
Unit Type and Rh: 7300
Unit Type and Rh: 7300
Unit Type and Rh: 7300
Unit Type and Rh: 7300

## 2019-08-29 LAB — TYPE AND SCREEN
ABO/RH(D): B POS
Antibody Screen: NEGATIVE
Unit division: 0
Unit division: 0
Unit division: 0
Unit division: 0
Unit division: 0
Unit division: 0
Unit division: 0
Unit division: 0
Unit division: 0
Unit division: 0
Unit division: 0
Unit division: 0
Unit division: 0

## 2019-08-29 LAB — CBC
HCT: 26 % — ABNORMAL LOW (ref 39.0–52.0)
Hemoglobin: 8.7 g/dL — ABNORMAL LOW (ref 13.0–17.0)
MCH: 33 pg (ref 26.0–34.0)
MCHC: 33.5 g/dL (ref 30.0–36.0)
MCV: 98.5 fL (ref 80.0–100.0)
Platelets: 120 10*3/uL — ABNORMAL LOW (ref 150–400)
RBC: 2.64 MIL/uL — ABNORMAL LOW (ref 4.22–5.81)
RDW: 14.1 % (ref 11.5–15.5)
WBC: 15.1 10*3/uL — ABNORMAL HIGH (ref 4.0–10.5)
nRBC: 0 % (ref 0.0–0.2)

## 2019-08-29 LAB — BASIC METABOLIC PANEL
Anion gap: 8 (ref 5–15)
BUN: 13 mg/dL (ref 6–20)
CO2: 28 mmol/L (ref 22–32)
Calcium: 8.5 mg/dL — ABNORMAL LOW (ref 8.9–10.3)
Chloride: 101 mmol/L (ref 98–111)
Creatinine, Ser: 1.31 mg/dL — ABNORMAL HIGH (ref 0.61–1.24)
GFR calc Af Amer: >60 mL/min (ref >60)
GFR calc non Af Amer: >60 mL/min (ref >60)
Glucose, Bld: 101 mg/dL — ABNORMAL HIGH (ref 70–99)
Potassium: 4 mmol/L (ref 3.5–5.1)
Sodium: 137 mmol/L (ref 135–145)

## 2019-08-29 MED ORDER — PANTOPRAZOLE SODIUM 40 MG PO TBEC
40.0000 mg | DELAYED_RELEASE_TABLET | Freq: Two times a day (BID) | ORAL | Status: DC
  Administered 2019-08-29 – 2019-08-31 (×5): 40 mg via ORAL
  Filled 2019-08-29 (×5): qty 1

## 2019-08-29 MED ORDER — ALUM & MAG HYDROXIDE-SIMETH 200-200-20 MG/5ML PO SUSP: 15.0000 mL | Freq: Four times a day (QID) | ORAL | Status: DC | PRN
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
## 2019-08-29 NOTE — Progress Notes (Signed)
2038: JP drain output of 90 mL of bloody drainage w/ 2 clots.   0032: Drainage is lighter and w/o clots. Output of 75 mL.  Total output overnight of 465 mL.
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
## 2019-08-30 LAB — BASIC METABOLIC PANEL
Anion gap: 12 (ref 5–15)
BUN: 11 mg/dL (ref 6–20)
CO2: 24 mmol/L (ref 22–32)
Calcium: 8.3 mg/dL — ABNORMAL LOW (ref 8.9–10.3)
Chloride: 101 mmol/L (ref 98–111)
Creatinine, Ser: 1.18 mg/dL (ref 0.61–1.24)
GFR calc Af Amer: >60 mL/min (ref >60)
GFR calc non Af Amer: >60 mL/min (ref >60)
Glucose, Bld: 108 mg/dL — ABNORMAL HIGH (ref 70–99)
Potassium: 3.6 mmol/L (ref 3.5–5.1)
Sodium: 137 mmol/L (ref 135–145)

## 2019-08-30 LAB — CBC
HCT: 26.5 % — ABNORMAL LOW (ref 39.0–52.0)
Hemoglobin: 8.9 g/dL — ABNORMAL LOW (ref 13.0–17.0)
MCH: 32.7 pg (ref 26.0–34.0)
MCHC: 33.6 g/dL (ref 30.0–36.0)
MCV: 97.4 fL (ref 80.0–100.0)
Platelets: 168 10*3/uL (ref 150–400)
RBC: 2.72 MIL/uL — ABNORMAL LOW (ref 4.22–5.81)
RDW: 13.8 % (ref 11.5–15.5)
WBC: 11.9 10*3/uL — ABNORMAL HIGH (ref 4.0–10.5)
nRBC: 0.3 % — ABNORMAL HIGH (ref 0.0–0.2)

## 2019-08-30 LAB — CREATININE, FLUID (PLEURAL, PERITONEAL, JP DRAINAGE): Creat, Fluid: 1.3 mg/dL

## 2019-08-30 MED ORDER — PHENAZOPYRIDINE HCL 100 MG PO TABS
100.0000 mg | ORAL_TABLET | Freq: Three times a day (TID) | ORAL | Status: DC
  Administered 2019-08-30 – 2019-08-31 (×2): 100 mg via ORAL
  Filled 2019-08-30 (×3): qty 1

## 2019-08-30 MED ORDER — METOPROLOL TARTRATE 25 MG PO TABS
25.0000 mg | ORAL_TABLET | Freq: Two times a day (BID) | ORAL | Status: DC
  Administered 2019-08-30 – 2019-08-31 (×3): 25 mg via ORAL
  Filled 2019-08-30 (×3): qty 1

## 2019-08-30 MED ORDER — SODIUM CHLORIDE 0.9% FLUSH
3.0000 mL | Freq: Two times a day (BID) | INTRAVENOUS | Status: DC
  Administered 2019-08-30 – 2019-08-31 (×3): 3 mL via INTRAVENOUS

## 2019-08-30 MED ORDER — SODIUM CHLORIDE 0.9 % IV SOLN: 250.0000 mL | INTRAVENOUS | Status: DC | PRN

## 2019-08-30 MED ORDER — ENOXAPARIN SODIUM 40 MG/0.4ML ~~LOC~~ SOLN
40.0000 mg | Freq: Two times a day (BID) | SUBCUTANEOUS | Status: DC
  Administered 2019-08-30 – 2019-08-31 (×3): 40 mg via SUBCUTANEOUS
  Filled 2019-08-30 (×3): qty 0.4

## 2019-08-30 MED ORDER — ACETAMINOPHEN 325 MG PO TABS
650.0000 mg | ORAL_TABLET | Freq: Four times a day (QID) | ORAL | Status: DC | PRN
  Administered 2019-08-30: 650 mg via ORAL
  Filled 2019-08-30: qty 2

## 2019-08-30 MED ORDER — SODIUM CHLORIDE 0.9% FLUSH: 3.0000 mL | INTRAVENOUS | Status: DC | PRN

## 2019-08-30 MED ORDER — METHOCARBAMOL 500 MG PO TABS
1000.0000 mg | ORAL_TABLET | Freq: Three times a day (TID) | ORAL | Status: DC
  Administered 2019-08-30 – 2019-08-31 (×4): 1000 mg via ORAL
  Filled 2019-08-30 (×4): qty 2
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
## 2019-08-30 NOTE — Progress Notes (Signed)
HB stable No urine leak on today's labs  Good progress

## 2019-08-30 NOTE — Progress Notes (Signed)
Inpatient Rehab Admissions Coordinator:   At this time we are recommending CIR consult.  I will place an order per protocol.    Estill Dooms, PT, DPT Admissions Coordinator [phone] 08/30/19  9:45 AM
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
## 2019-08-31 ENCOUNTER — Telehealth: Payer: Self-pay | Admitting: Family Medicine

## 2019-08-31 LAB — CBC
HCT: 27.3 % — ABNORMAL LOW (ref 39.0–52.0)
Hemoglobin: 9.2 g/dL — ABNORMAL LOW (ref 13.0–17.0)
MCH: 32.4 pg (ref 26.0–34.0)
MCHC: 33.7 g/dL (ref 30.0–36.0)
MCV: 96.1 fL (ref 80.0–100.0)
Platelets: 205 10*3/uL (ref 150–400)
RBC: 2.84 MIL/uL — ABNORMAL LOW (ref 4.22–5.81)
RDW: 13.9 % (ref 11.5–15.5)
WBC: 11.6 10*3/uL — ABNORMAL HIGH (ref 4.0–10.5)
nRBC: 0.4 % — ABNORMAL HIGH (ref 0.0–0.2)

## 2019-08-31 LAB — COMPREHENSIVE METABOLIC PANEL
ALT: 33 U/L (ref 0–44)
AST: 22 U/L (ref 15–41)
Albumin: 2.6 g/dL — ABNORMAL LOW (ref 3.5–5.0)
Alkaline Phosphatase: 34 U/L — ABNORMAL LOW (ref 38–126)
Anion gap: 10 (ref 5–15)
BUN: 9 mg/dL (ref 6–20)
CO2: 29 mmol/L (ref 22–32)
Calcium: 8.5 mg/dL — ABNORMAL LOW (ref 8.9–10.3)
Chloride: 98 mmol/L (ref 98–111)
Creatinine, Ser: 1.24 mg/dL (ref 0.61–1.24)
GFR calc Af Amer: >60 mL/min (ref >60)
GFR calc non Af Amer: >60 mL/min (ref >60)
Glucose, Bld: 105 mg/dL — ABNORMAL HIGH (ref 70–99)
Potassium: 3.3 mmol/L — ABNORMAL LOW (ref 3.5–5.1)
Sodium: 137 mmol/L (ref 135–145)
Total Bilirubin: 0.8 mg/dL (ref 0.3–1.2)
Total Protein: 5.5 g/dL — ABNORMAL LOW (ref 6.5–8.1)

## 2019-08-31 MED ORDER — AMLODIPINE BESYLATE 5 MG PO TABS: 5.0000 mg | ORAL_TABLET | Freq: Every day | ORAL | 6 refills | Status: DC

## 2019-08-31 MED ORDER — DOCUSATE SODIUM 100 MG PO CAPS: 100.0000 mg | ORAL_CAPSULE | Freq: Two times a day (BID) | ORAL | Status: DC | PRN

## 2019-08-31 MED ORDER — METHOCARBAMOL 500 MG PO TABS: 1000.0000 mg | ORAL_TABLET | Freq: Three times a day (TID) | ORAL | 0 refills | Status: DC | PRN

## 2019-08-31 MED ORDER — ACETAMINOPHEN 500 MG PO TABS: 1000.0000 mg | ORAL_TABLET | Freq: Four times a day (QID) | ORAL | Status: DC | PRN

## 2019-08-31 MED ORDER — BISACODYL 10 MG RE SUPP: 10.0000 mg | Freq: Every day | RECTAL | Status: DC | PRN

## 2019-08-31 MED ORDER — DOCUSATE SODIUM 100 MG PO CAPS: 100.0000 mg | ORAL_CAPSULE | Freq: Two times a day (BID) | ORAL | Status: DC

## 2019-08-31 MED ORDER — ACETAMINOPHEN 500 MG PO TABS: 1000.0000 mg | ORAL_TABLET | Freq: Four times a day (QID) | ORAL | Status: DC

## 2019-08-31 MED ORDER — POLYETHYLENE GLYCOL 3350 17 G PO PACK: 17.0000 g | PACK | Freq: Every day | ORAL | Status: DC | PRN

## 2019-08-31 MED ORDER — POLYETHYLENE GLYCOL 3350 17 G PO PACK: 17.0000 g | PACK | Freq: Every day | ORAL | Status: DC

## 2019-08-31 MED ORDER — OXYCODONE HCL 10 MG PO TABS: 5.0000 mg | ORAL_TABLET | Freq: Four times a day (QID) | ORAL | 0 refills | Status: DC | PRN

## 2019-08-31 NOTE — Discharge Instructions (Signed)
[facility]      [facility] [phone]  OPEN ABDOMINAL SURGERY: POST OP INSTRUCTIONS  Always review your discharge instruction sheet given to you by the facility where your surgery was performed.  IF YOU HAVE DISABILITY OR FAMILY LEAVE FORMS, YOU MUST BRING THEM TO THE OFFICE FOR PROCESSING.  PLEASE DO NOT GIVE THEM TO YOUR DOCTOR.  1. A prescription for pain medication may be given to you upon discharge.  Take your pain medication as prescribed, if needed.  If narcotic pain medicine is not needed, then you may take acetaminophen (Tylenol) or ibuprofen (Advil) as needed. 2. Take your usually prescribed medications unless otherwise directed. 3. If you need a refill on your pain medication, please contact your pharmacy. They will contact our office to request authorization.  Prescriptions will not be filled after 5pm or on week-ends. 4. You should follow a light diet the first few days after arrival home, such as soup and crackers, pudding, etc.unless your doctor has advised otherwise. A high-fiber, low fat diet can be resumed as tolerated.   Be sure to include lots of fluids daily. Most patients will experience some swelling and bruising on the chest and neck area.  Ice packs will help.  Swelling and bruising can take several days to resolve 5. Most patients will experience some swelling and bruising in the area of the incision. Ice pack will help. Swelling and bruising can take several days to resolve..  6. It is common to experience some constipation if taking pain medication after surgery.  Increasing fluid intake and taking a stool softener will usually help or prevent this problem from occurring.  A mild laxative (Milk of Magnesia or Miralax) should be taken according to package directions if there are no bowel movements after 48 hours. 7.  You may have steri-strips (small skin tapes) in place directly over the incision.  These strips should be left on the skin for 7-10 days.  If your  surgeon used skin glue on the incision, you may shower in 24 hours.  The glue will flake off over the next 2-3 weeks.  Any sutures or staples will be removed at the office during your follow-up visit. You may find that a light gauze bandage over your incision may keep your staples from being rubbed or pulled. You may shower and replace the bandage daily. 8. ACTIVITIES:  You may resume regular (light) daily activities beginning the next day--such as daily self-care, walking, climbing stairs--gradually increasing activities as tolerated.  You may have sexual intercourse when it is comfortable.  Refrain from any heavy lifting or straining until approved by your doctor. a. You may drive when you no longer are taking prescription pain medication, you can comfortably wear a seatbelt, and you can safely maneuver your car and apply brakes 9. You should see your doctor in the office for a follow-up appointment approximately two weeks after your surgery.  Make sure that you call for this appointment within a day or two after you arrive home to insure a convenient appointment time.   WHEN TO CALL YOUR DOCTOR: 1. Fever over 101.0 2. Inability to urinate 3. Nausea and/or vomiting 4. Extreme swelling or bruising 5. Continued bleeding from incision. 6. Increased pain, redness, or drainage from the incision. 7. Difficulty swallowing or breathing 8. Muscle cramping or spasms. 9. Numbness or tingling in hands or feet or around lips.  The clinic staff is available to answer your questions during regular business hours.  Please   don't hesitate to call and ask to speak to one of the nurses if you have concerns.  For further questions, please visit [email or web address]   Surgical Drain Home Care Surgical drains are used to remove extra fluid that normally builds up in a surgical wound after surgery. A surgical drain helps to heal a surgical wound. Different kinds of surgical drains include:  Active  drains. These drains use suction to pull drainage away from the surgical wound. Drainage flows through a tube to a container outside of the body. With these drains, you need to keep the bulb or the drainage container flat (compressed) at all times, except while you empty it. Flattening the bulb or container creates suction.  Passive drains. These drains allow fluid to drain naturally, by gravity. Drainage flows through a tube to a bandage (dressing) or a container outside of the body. Passive drains do not need to be emptied. A drain is placed during surgery. Right after surgery, drainage is usually bright red and a little thicker than water. The drainage may gradually turn yellow or pink and become thinner. It is likely that your health care provider will remove the drain when the drainage stops or when the amount decreases to 1-2 Tbsp (15-30 mL) during a 24-hour period. Supplies needed:  Tape.  Germ-free cleaning solution (sterile saline).  Cotton swabs.  Split gauze drain sponge: 4 x 4 inches (10 x 10 cm).  Gauze square: 4 x 4 inches (10 x 10 cm). How to care for your surgical drain Care for your drain as told by your health care provider. This is important to help prevent infection. If your drain is placed at your back, or any other hard-to-reach area, ask another person to assist you in performing the following tasks: General care  Keep the skin around the drain dry and covered with a dressing at all times.  Check your drain area every day for signs of infection. Check for: ? Redness, swelling, or pain. ? Pus or a bad smell. ? Cloudy drainage. ? Tenderness or pressure at the drain exit site. Changing the dressing Follow instructions from your health care provider about how to change your dressing. Change your dressing at least once a day. Change it more often if needed to keep the dressing dry. Make sure you: 1. Gather your supplies. 2. Wash your hands with soap and water before you  change your dressing. If soap and water are not available, use hand sanitizer. 3. Remove the old dressing. Avoid using scissors to do that. 4. Wash your hands with soap and water again after removing the old dressing. 5. Use sterile saline to clean your skin around the drain. You may need to use a cotton swab to clean the skin. 6. Place the tube through the slit in a drain sponge. Place the drain sponge so that it covers your wound. 7. Place the gauze square or another drain sponge on top of the drain sponge that is on the wound. Make sure the tube is between those layers. 8. Tape the dressing to your skin. 9. Tape the drainage tube to your skin 1-2 inches (2.5-5 cm) below the place where the tube enters your body. Taping keeps the tube from pulling on any stitches (sutures) that you have. 10. Wash your hands with soap and water. 11. Write down the color of your drainage and how often you change your dressing. How to empty your active drain  1. Make sure that you have a measuring   cup that you can empty your drainage into. 2. Wash your hands with soap and water. If soap and water are not available, use hand sanitizer. 3. Loosen any pins or clips that hold the tube in place. 4. If your health care provider tells you to strip the tube to prevent clots and tube blockages: ? Hold the tube at the skin with one hand. Use your other hand to pinch the tubing with your thumb and first finger. ? Gently move your fingers down the tube while squeezing very lightly. This clears any drainage, clots, or tissue from the tube. ? You may need to do this several times each day to keep the tube clear. Do not pull on the tube. 5. Open the bulb cap or the drain plug. Do not touch the inside of the cap or the bottom of the plug. 6. Turn the device upside down and gently squeeze. 7. Empty all of the drainage into the measuring cup. 8. Compress the bulb or the container and replace the cap or the plug. To compress the  bulb or the container, squeeze it firmly in the middle while you close the cap or plug the container. 9. Write down the amount of drainage that you have in each 24-hour period. If you have less than 2 Tbsp (30 mL) of drainage during 24 hours, contact your health care provider. 10. Flush the drainage down the toilet. 11. Wash your hands with soap and water. Contact a health care provider if:  You have redness, swelling, or pain around your drain area.  You have pus or a bad smell coming from your drain area.  You have a fever or chills.  The skin around your drain is warm to the touch.  The amount of drainage that you have is increasing instead of decreasing.  You have drainage that is cloudy.  There is a sudden stop or a sudden decrease in the amount of drainage that you have.  Your drain tube falls out.  Your active drain does not stay compressed after you empty it. Summary  Surgical drains are used to remove extra fluid that normally builds up in a surgical wound after surgery.  Different kinds of surgical drains include active drains and passive drains. Active drains use suction to pull drainage away from the surgical wound, and passive drains allow fluid to drain naturally.  It is important to care for your drain to prevent infection. If your drain is placed at your back, or any other hard-to-reach area, ask another person to assist you.  Contact your health care provider if you have redness, swelling, or pain around your drain area. This information is not intended to replace advice given to you by your health care provider. Make sure you discuss any questions you have with your health care provider. Document Revised: 07/29/2018 Document Reviewed: 07/29/2018 Elsevier Patient Education  2020 [employer]   Surgical Drain Record Empty your surgical drain as told by your health care provider. Use this form to write down the amount of fluid that has collected in the drainage  container. Bring this form with you to your follow-up visits.   Date __________ Time __________ Amount __________ Date __________ Time __________ Amount __________ Date __________ Time __________ Amount __________ Date __________ Time __________ Amount __________ Date __________ Time __________ Amount __________ Date __________ Time __________ Amount __________ Date __________ Time __________ Amount __________ Date __________ Time __________ Amount __________ Date __________ Time __________ Amount __________ Date __________ Time __________   Amount __________ Date __________ Time __________ Amount __________ Date __________ Time __________ Amount __________ Date __________ Time __________ Amount __________ Date __________ Time __________ Amount __________ Date __________ Time __________ Amount __________ Date __________ Time __________ Amount __________ Date __________ Time __________ Amount __________ Date __________ Time __________ Amount __________ Date __________ Time __________ Amount __________ Date __________ Time __________ Amount __________ Date __________ Time __________ Amount __________ Date __________ Time __________ Amount __________ Date __________ Time __________ Amount __________ Date __________ Time __________ Amount __________ Date __________ Time __________ Amount __________ Date __________ Time __________ Amount __________ Date __________ Time __________ Amount __________ Date __________ Time __________ Amount __________ Date __________ Time __________ Amount __________ Date __________ Time __________ Amount __________ Date __________ Time __________ Amount __________ Date __________ Time __________ Amount __________ Date __________ Time __________ Amount __________ Date __________ Time __________ Amount __________ Date __________ Time __________ Amount __________ Date __________ Time __________ Amount __________ Date __________ Time __________ Amount  __________ Date __________ Time __________ Amount __________ Date __________ Time __________ Amount __________ Date __________ Time __________ Amount __________ Date __________ Time __________ Amount __________ Date __________ Time __________ Amount __________ This information is not intended to replace advice given to you by your health care provider. Make sure you discuss any questions you have with your health care provider. Document Revised: 03/31/2017 Document Reviewed: 03/31/2017 Elsevier Patient Education  2020 Elsevier Inc.  

## 2019-08-31 NOTE — Telephone Encounter (Signed)
Sent!

## 2019-08-31 NOTE — Progress Notes (Signed)
Discharge instructions reviewed with patient and patient's mother.  Patient instructed how to empty drain and care for wounds including dressing changes.
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
## 2019-08-31 NOTE — Telephone Encounter (Signed)
Tried to return the call to advise the medication was sent in - no answer. The patient can check with the pharmacy.
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
## 2019-08-31 NOTE — Progress Notes (Signed)
Inpatient Rehab Admissions Coordinator:   Note therapy recommendations updated to home health. Will sign off at this time.   Estill Dooms, PT, DPT Admissions Coordinator [phone] 08/31/19  9:19 AM
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
## 2019-08-31 NOTE — Telephone Encounter (Signed)
Patient's mom called. Says he did not receive the Amlodipine with the rest of the medications. She would like that called in the the [facility] in [place]. Her call back number is [phone]

## 2019-09-01 ENCOUNTER — Encounter: Payer: Self-pay | Admitting: Family Medicine

## 2019-09-08 ENCOUNTER — Other Ambulatory Visit: Payer: Self-pay

## 2019-09-08 ENCOUNTER — Ambulatory Visit (HOSPITAL_COMMUNITY): Payer: 59

## 2019-09-08 ENCOUNTER — Ambulatory Visit (HOSPITAL_COMMUNITY): Payer: 59 | Attending: Physician Assistant | Admitting: Physical Therapy

## 2019-09-08 ENCOUNTER — Encounter (HOSPITAL_COMMUNITY): Payer: Self-pay

## 2019-09-08 ENCOUNTER — Encounter (HOSPITAL_COMMUNITY): Payer: Self-pay | Admitting: Physical Therapy

## 2019-09-08 ENCOUNTER — Telehealth (HOSPITAL_COMMUNITY): Payer: Self-pay

## 2019-09-08 DIAGNOSIS — R29898 Other symptoms and signs involving the musculoskeletal system: Secondary | ICD-10-CM | POA: Diagnosis present

## 2019-09-08 DIAGNOSIS — M6281 Muscle weakness (generalized): Secondary | ICD-10-CM | POA: Diagnosis present
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
## 2019-09-08 NOTE — Telephone Encounter (Signed)
pt came to late for his ot visit today but will still do his pt appt. Ot had to be rescheduled.

## 2019-09-10 ENCOUNTER — Other Ambulatory Visit: Payer: Self-pay

## 2019-09-10 ENCOUNTER — Encounter: Payer: Self-pay | Admitting: Family Medicine

## 2019-09-10 ENCOUNTER — Ambulatory Visit: Payer: 59 | Admitting: Family Medicine

## 2019-09-10 ENCOUNTER — Telehealth: Payer: Self-pay | Admitting: Family Medicine

## 2019-09-10 VITALS — BP 157/113 | HR 95

## 2019-09-10 DIAGNOSIS — R0683 Snoring: Secondary | ICD-10-CM

## 2019-09-10 DIAGNOSIS — I10 Essential (primary) hypertension: Secondary | ICD-10-CM

## 2019-09-10 MED ORDER — OMEPRAZOLE 20 MG PO CPDR: 20.0000 mg | DELAYED_RELEASE_CAPSULE | Freq: Every day | ORAL | 3 refills | Status: DC | PRN

## 2019-09-10 NOTE — Telephone Encounter (Signed)
Tried to call the patient back  -  reached his voice mail, which has not been set up yet. Will try him again on Monday.

## 2019-09-10 NOTE — Telephone Encounter (Signed)
Patient called and stated that Dr. Prince Rome was supposed to send over papers to his Lawyers. Missing is are papers  note as to why patient was out of work.  Please call patient to advise.  [phone]

## 2019-09-10 NOTE — Telephone Encounter (Signed)
Letter printed.

## 2019-09-10 NOTE — Telephone Encounter (Signed)
Please advise 
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
## 2019-09-13 NOTE — Telephone Encounter (Signed)
I called the patient: he needed the previous letters from 02/12/19 and the 2 from 03/12/19 printed out and signed again for his lawyers. Advised the patient he may pick these up this afternoon.

## 2019-09-14 ENCOUNTER — Other Ambulatory Visit: Payer: Self-pay

## 2019-09-14 ENCOUNTER — Ambulatory Visit (HOSPITAL_COMMUNITY): Payer: 59

## 2019-09-14 ENCOUNTER — Encounter (HOSPITAL_COMMUNITY): Payer: Self-pay

## 2019-09-14 ENCOUNTER — Telehealth (HOSPITAL_COMMUNITY): Payer: Self-pay

## 2019-09-14 DIAGNOSIS — M6281 Muscle weakness (generalized): Secondary | ICD-10-CM

## 2019-09-14 DIAGNOSIS — R29898 Other symptoms and signs involving the musculoskeletal system: Secondary | ICD-10-CM

## 2019-09-14 NOTE — Patient Instructions (Addendum)
Isometric Abdominal    Lying on back with knees bent, tighten stomach by pressing elbows down. Hold 5 seconds. Repeat 10 times per set. Do 2 sets per session.   http://orth.exer.us/1086   Copyright  VHI. All rights reserved.   Bent Leg Lift (Hook-Lying)    Tighten stomach and slowly raise right leg 12 inches from floor. Keep trunk rigid. Hold 5 seconds. Repeat 10 times per set. Do 2 sets per session.  http://orth.exer.us/1090   Copyright  VHI. All rights reserved.   Bridging    Slowly raise buttocks from floor, keeping stomach tight. Repeat 15 times per set. Do 2 sets per day.  http://orth.exer.us/1096   Copyright  VHI. All rights reserved.   Toe / Heel Raise (Standing)    Standing with support, raise heels, then rock back on heels and raise toes. Repeat 20 times.  Copyright  VHI. All rights reserved.   FUNCTIONAL MOBILITY: Squat    Stance: shoulder-width on floor. Bend hips and knees. Keep back straight. Do not allow knees to bend past toes. Squeeze glutes and quads to stand. 15 reps per set, 2 sets per day.  Copyright  VHI. All rights reserved.
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
## 2019-09-14 NOTE — Telephone Encounter (Signed)
pt stated tht he could not stay due to transportation cancelled today's appt.

## 2019-09-17 ENCOUNTER — Ambulatory Visit (HOSPITAL_COMMUNITY): Payer: 59 | Admitting: Occupational Therapy

## 2019-09-17 ENCOUNTER — Other Ambulatory Visit: Payer: Self-pay

## 2019-09-17 ENCOUNTER — Encounter (HOSPITAL_COMMUNITY): Payer: Self-pay | Admitting: Occupational Therapy

## 2019-09-17 DIAGNOSIS — R29898 Other symptoms and signs involving the musculoskeletal system: Secondary | ICD-10-CM

## 2019-09-17 DIAGNOSIS — M6281 Muscle weakness (generalized): Secondary | ICD-10-CM | POA: Diagnosis not present
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
## 2019-09-21 ENCOUNTER — Encounter (HOSPITAL_COMMUNITY): Payer: Self-pay | Admitting: Physical Therapy

## 2019-09-21 ENCOUNTER — Other Ambulatory Visit: Payer: Self-pay

## 2019-09-21 ENCOUNTER — Ambulatory Visit (HOSPITAL_COMMUNITY): Payer: 59 | Admitting: Physical Therapy

## 2019-09-21 DIAGNOSIS — R29898 Other symptoms and signs involving the musculoskeletal system: Secondary | ICD-10-CM

## 2019-09-21 DIAGNOSIS — M6281 Muscle weakness (generalized): Secondary | ICD-10-CM
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
## 2019-09-29 ENCOUNTER — Ambulatory Visit (HOSPITAL_COMMUNITY): Payer: 59

## 2019-09-29 ENCOUNTER — Encounter (HOSPITAL_COMMUNITY): Payer: Self-pay

## 2019-09-29 ENCOUNTER — Other Ambulatory Visit: Payer: Self-pay

## 2019-09-29 DIAGNOSIS — R29898 Other symptoms and signs involving the musculoskeletal system: Secondary | ICD-10-CM

## 2019-09-29 DIAGNOSIS — M6281 Muscle weakness (generalized): Secondary | ICD-10-CM | POA: Diagnosis not present
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
## 2019-10-05 ENCOUNTER — Encounter (HOSPITAL_COMMUNITY): Payer: Self-pay | Admitting: Physical Therapy

## 2019-10-05 ENCOUNTER — Other Ambulatory Visit: Payer: Self-pay

## 2019-10-05 ENCOUNTER — Ambulatory Visit (HOSPITAL_COMMUNITY): Payer: 59 | Admitting: Physical Therapy

## 2019-10-05 DIAGNOSIS — R29898 Other symptoms and signs involving the musculoskeletal system: Secondary | ICD-10-CM

## 2019-10-05 DIAGNOSIS — M6281 Muscle weakness (generalized): Secondary | ICD-10-CM | POA: Diagnosis not present
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
## 2019-10-06 ENCOUNTER — Encounter: Payer: Self-pay | Admitting: Family Medicine

## 2019-10-06 ENCOUNTER — Ambulatory Visit (INDEPENDENT_AMBULATORY_CARE_PROVIDER_SITE_OTHER): Payer: 59 | Admitting: Family Medicine

## 2019-10-06 VITALS — BP 169/111 | HR 93

## 2019-10-06 DIAGNOSIS — R1033 Periumbilical pain: Secondary | ICD-10-CM | POA: Diagnosis not present

## 2019-10-06 DIAGNOSIS — I10 Essential (primary) hypertension: Secondary | ICD-10-CM
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
## 2019-10-06 NOTE — Patient Instructions (Signed)
   Vitamin D3:  5,000 IU daily  Vitamin C:  1,000 mg daily  Zinc:  20-30 mg daily   Probiotics once daily for 2-3 months (Align is one example)

## 2019-10-25 ENCOUNTER — Encounter: Payer: Self-pay | Admitting: Family Medicine

## 2019-11-03 ENCOUNTER — Other Ambulatory Visit: Payer: Self-pay

## 2019-11-03 ENCOUNTER — Emergency Department (HOSPITAL_COMMUNITY): Payer: 59

## 2019-11-03 ENCOUNTER — Telehealth: Payer: Self-pay | Admitting: Family Medicine

## 2019-11-03 ENCOUNTER — Emergency Department (HOSPITAL_COMMUNITY)
Admission: EM | Admit: 2019-11-03 | Discharge: 2019-11-03 | Disposition: A | Discharge status: Home / Self Care | Payer: 59 | Attending: Emergency Medicine | Admitting: Emergency Medicine

## 2019-11-03 ENCOUNTER — Encounter (HOSPITAL_COMMUNITY): Payer: Self-pay | Admitting: Emergency Medicine

## 2019-11-03 DIAGNOSIS — Z79899 Other long term (current) drug therapy: Secondary | ICD-10-CM | POA: Diagnosis not present

## 2019-11-03 DIAGNOSIS — F17210 Nicotine dependence, cigarettes, uncomplicated: Secondary | ICD-10-CM | POA: Diagnosis not present

## 2019-11-03 DIAGNOSIS — I10 Essential (primary) hypertension: Secondary | ICD-10-CM | POA: Diagnosis not present

## 2019-11-03 DIAGNOSIS — R42 Dizziness and giddiness: Secondary | ICD-10-CM | POA: Diagnosis present

## 2019-11-03 LAB — CBC WITH DIFFERENTIAL/PLATELET
Abs Immature Granulocytes: 0.02 10*3/uL (ref 0.00–0.07)
Basophils Absolute: 0.1 10*3/uL (ref 0.0–0.1)
Basophils Relative: 1 %
Eosinophils Absolute: 0.1 10*3/uL (ref 0.0–0.5)
Eosinophils Relative: 1 %
HCT: 52.3 % — ABNORMAL HIGH (ref 39.0–52.0)
Hemoglobin: 17.3 g/dL — ABNORMAL HIGH (ref 13.0–17.0)
Immature Granulocytes: 0 %
Lymphocytes Relative: 24 %
Lymphs Abs: 2 10*3/uL (ref 0.7–4.0)
MCH: 31.7 pg (ref 26.0–34.0)
MCHC: 33.1 g/dL (ref 30.0–36.0)
MCV: 96 fL (ref 80.0–100.0)
Monocytes Absolute: 0.7 10*3/uL (ref 0.1–1.0)
Monocytes Relative: 8 %
Neutro Abs: 5.6 10*3/uL (ref 1.7–7.7)
Neutrophils Relative %: 66 %
Platelets: 183 10*3/uL (ref 150–400)
RBC: 5.45 MIL/uL (ref 4.22–5.81)
RDW: 14 % (ref 11.5–15.5)
WBC: 8.4 10*3/uL (ref 4.0–10.5)
nRBC: 0 % (ref 0.0–0.2)

## 2019-11-03 LAB — BASIC METABOLIC PANEL
Anion gap: 13 (ref 5–15)
BUN: 10 mg/dL (ref 6–20)
CO2: 23 mmol/L (ref 22–32)
Calcium: 9.2 mg/dL (ref 8.9–10.3)
Chloride: 101 mmol/L (ref 98–111)
Creatinine, Ser: 1.01 mg/dL (ref 0.61–1.24)
GFR calc Af Amer: >60 mL/min (ref >60)
GFR calc non Af Amer: >60 mL/min (ref >60)
Glucose, Bld: 110 mg/dL — ABNORMAL HIGH (ref 70–99)
Potassium: 3.6 mmol/L (ref 3.5–5.1)
Sodium: 137 mmol/L (ref 135–145)

## 2019-11-03 MED ORDER — AMLODIPINE BESYLATE 5 MG PO TABS
10.0000 mg | ORAL_TABLET | Freq: Once | ORAL | Status: AC
  Administered 2019-11-03: 10 mg via ORAL
  Filled 2019-11-03: qty 2

## 2019-11-03 MED ORDER — LISINOPRIL 20 MG PO TABS
20.0000 mg | ORAL_TABLET | Freq: Every day | ORAL | 0 refills | Status: DC
Start: 2019-11-03 — End: 2022-07-11

## 2019-11-03 MED ORDER — MECLIZINE HCL 25 MG PO TABS
25.0000 mg | ORAL_TABLET | Freq: Three times a day (TID) | ORAL | 0 refills | Status: DC | PRN
Start: 2019-11-03 — End: 2022-06-14

## 2019-11-03 MED ORDER — LISINOPRIL 10 MG PO TABS: 20.0000 mg | ORAL_TABLET | Freq: Once | ORAL | Status: DC

## 2019-11-03 NOTE — ED Notes (Signed)
Pt refused Lisinopril. Educated pt of importance on taking Lisinopril for BP management. Pt states he will pick up meds from pharmacy upon D/C.   PT refused D/C vitals.

## 2019-11-03 NOTE — Telephone Encounter (Signed)
Patient would like for Dr. Prince Rome to call him.  CB#[phone].  Thank you.
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
## 2019-11-03 NOTE — Discharge Instructions (Addendum)
Your lab tests and CT imaging as well as your exam is reassuring today. However, your blood pressure is very elevated.  It is important that you take your blood pressure medicine daily.  Since the amlodipine seems to cause lightheadedness,  we suggest holding this medicine, instead taking the lisinopril as was prescribed . Measure your blood pressure daily and plan to show these numbers to Dr [facility] when you see him.  You may also take the meclizine if needed for any return of your vertigo symptoms.
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
## 2019-11-03 NOTE — ED Triage Notes (Signed)
Dizziness on and off x 3 days

## 2019-11-04 NOTE — Telephone Encounter (Signed)
I called, no voice mail.  I probably won't be able to connect with him until Monday.

## 2019-11-08 ENCOUNTER — Ambulatory Visit: Payer: 59 | Admitting: Family Medicine

## 2019-11-29 ENCOUNTER — Telehealth: Payer: Self-pay | Admitting: Family Medicine

## 2019-11-29 DIAGNOSIS — I10 Essential (primary) hypertension: Secondary | ICD-10-CM

## 2019-11-29 DIAGNOSIS — G4730 Sleep apnea, unspecified: Secondary | ICD-10-CM

## 2019-11-29 NOTE — Telephone Encounter (Signed)
Sleep study revealed severe sleep apnea.  His oxygen levels dropped during sleep as well.  He would benefit from using a CPAP machine.  I recommend getting him set up for this if he is in agreement.  It will help with control of his blood pressure as well.

## 2019-12-09 NOTE — Telephone Encounter (Signed)
Called patient - no answer and no voice mail picked up. Will try again another time.

## 2019-12-10 NOTE — Telephone Encounter (Signed)
Attempted to contact the patient again - no answer and the voice mailbox has not been set up.

## 2019-12-16 NOTE — Telephone Encounter (Signed)
Tried calling pt. No answer no voice mail set up

## 2020-01-03 ENCOUNTER — Telehealth: Payer: Self-pay

## 2020-01-03 NOTE — Telephone Encounter (Signed)
As we have been unable to reach the patient by phone regarding his sleep study results, I mailed a letter to him at his mother's address ([name] in [place]) with these results enclosed. Asked the patient to call and let us know how/if he would like to proceed.

## 2020-03-09 ENCOUNTER — Ambulatory Visit: Payer: Self-pay | Attending: Internal Medicine

## 2020-03-09 DIAGNOSIS — Z23 Encounter for immunization: Secondary | ICD-10-CM

## 2020-03-09 NOTE — Progress Notes (Signed)
   Covid-19 Vaccination Clinic  Name:  Fredrik Mogel    MRN: [id number] DOB: Jan 17, 1981  03/09/2020  Mr. Blomquist was observed post Covid-19 immunization for 30 minutes based on pre-vaccination screening without incident. He was provided with Vaccine Information Sheet and instruction to access the V-Safe system.   Mr. Steinborn was instructed to call 911 with any severe reactions post vaccine: [name] Difficulty breathing  . Swelling of face and throat  . A fast heartbeat  . A bad rash all over body  . Dizziness and weakness   Immunizations Administered    Name Date Dose VIS Date Route   Pfizer COVID-19 Vaccine 03/09/2020 11:01 AM 0.3 mL 09/01/2018 Intramuscular   Manufacturer: [employer], [employer]   Lot: [id number]   NDC: [address]-1

## 2020-03-30 ENCOUNTER — Ambulatory Visit: Payer: Self-pay | Attending: Internal Medicine

## 2020-03-30 ENCOUNTER — Ambulatory Visit: Payer: Self-pay

## 2020-03-30 DIAGNOSIS — Z23 Encounter for immunization: Secondary | ICD-10-CM

## 2020-03-30 NOTE — Progress Notes (Signed)
   Covid-19 Vaccination Clinic  Name:  Gerald Phillips    MRN: [id number] DOB: 01-09-1981  03/30/2020  Gerald Phillips was observed post Covid-19 immunization for 30 minutes based on pre-vaccination screening without incident. He was provided with Vaccine Information Sheet and instruction to access the V-Safe system.   Gerald Phillips was instructed to call 911 with any severe reactions post vaccine: [name] Difficulty breathing  . Swelling of face and throat  . A fast heartbeat  . A bad rash all over body  . Dizziness and weakness   Immunizations Administered    Name Date Dose VIS Date Route   Pfizer COVID-19 Vaccine 03/30/2020  9:32 AM 0.3 mL 09/01/2018 Intramuscular   Manufacturer: [employer], [employer]   Lot: [id number]   NDC: [id number]

## 2020-05-17 ENCOUNTER — Ambulatory Visit (INDEPENDENT_AMBULATORY_CARE_PROVIDER_SITE_OTHER): Payer: 59 | Admitting: Family Medicine

## 2020-05-17 ENCOUNTER — Encounter: Payer: Self-pay | Admitting: Family Medicine

## 2020-05-17 ENCOUNTER — Other Ambulatory Visit: Payer: Self-pay

## 2020-05-17 VITALS — BP 162/116 | HR 78

## 2020-05-17 DIAGNOSIS — R109 Unspecified abdominal pain: Secondary | ICD-10-CM

## 2020-05-17 DIAGNOSIS — M545 Low back pain, unspecified: Secondary | ICD-10-CM

## 2020-05-17 MED ORDER — BACLOFEN 10 MG PO TABS: 5.0000 mg | ORAL_TABLET | Freq: Three times a day (TID) | ORAL | 3 refills | Status: DC | PRN

## 2020-05-17 MED ORDER — CELECOXIB 200 MG PO CAPS
200.0000 mg | ORAL_CAPSULE | Freq: Two times a day (BID) | ORAL | 6 refills | Status: DC | PRN
Start: 2020-05-17 — End: 2022-07-11
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
## 2020-10-10 ENCOUNTER — Ambulatory Visit: Payer: Self-pay | Admitting: *Deleted
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
## 2020-10-27 ENCOUNTER — Other Ambulatory Visit: Payer: Self-pay | Admitting: Family Medicine

## 2021-06-12 ENCOUNTER — Other Ambulatory Visit: Payer: Self-pay

## 2021-06-12 ENCOUNTER — Emergency Department (HOSPITAL_COMMUNITY)
Admission: EM | Admit: 2021-06-12 | Discharge: 2021-06-12 | Disposition: A | Discharge status: Home / Self Care | Payer: Self-pay | Attending: Emergency Medicine | Admitting: Emergency Medicine

## 2021-06-12 ENCOUNTER — Emergency Department (HOSPITAL_COMMUNITY): Payer: Self-pay

## 2021-06-12 ENCOUNTER — Encounter (HOSPITAL_COMMUNITY): Payer: Self-pay | Admitting: Emergency Medicine

## 2021-06-12 DIAGNOSIS — F17210 Nicotine dependence, cigarettes, uncomplicated: Secondary | ICD-10-CM | POA: Insufficient documentation

## 2021-06-12 DIAGNOSIS — Z79899 Other long term (current) drug therapy: Secondary | ICD-10-CM | POA: Insufficient documentation

## 2021-06-12 DIAGNOSIS — N644 Mastodynia: Secondary | ICD-10-CM | POA: Insufficient documentation

## 2021-06-12 DIAGNOSIS — I10 Essential (primary) hypertension: Secondary | ICD-10-CM | POA: Insufficient documentation

## 2021-06-12 DIAGNOSIS — R051 Acute cough: Secondary | ICD-10-CM | POA: Insufficient documentation
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
## 2021-06-12 NOTE — Discharge Instructions (Signed)
You were seen in the emergency department for cough and chest pain with cough.  Likely that it is just musculoskeletal/chest wall pain.  Please return to emergency department if your chest pain worsens or you begin having nausea or diaphoresis.  Please take your blood pressure medication as prescribed.  Your blood pressure was elevated here in the emergency department.

## 2022-04-15 IMAGING — DX DG CHEST 2V
2 series · 2 of 2 positions shown · non-contrast
Comparison: August 25, 2019.

CLINICAL DATA: Cough, chest pain.

EXAM:
CHEST - 2 VIEW

[chest pa]
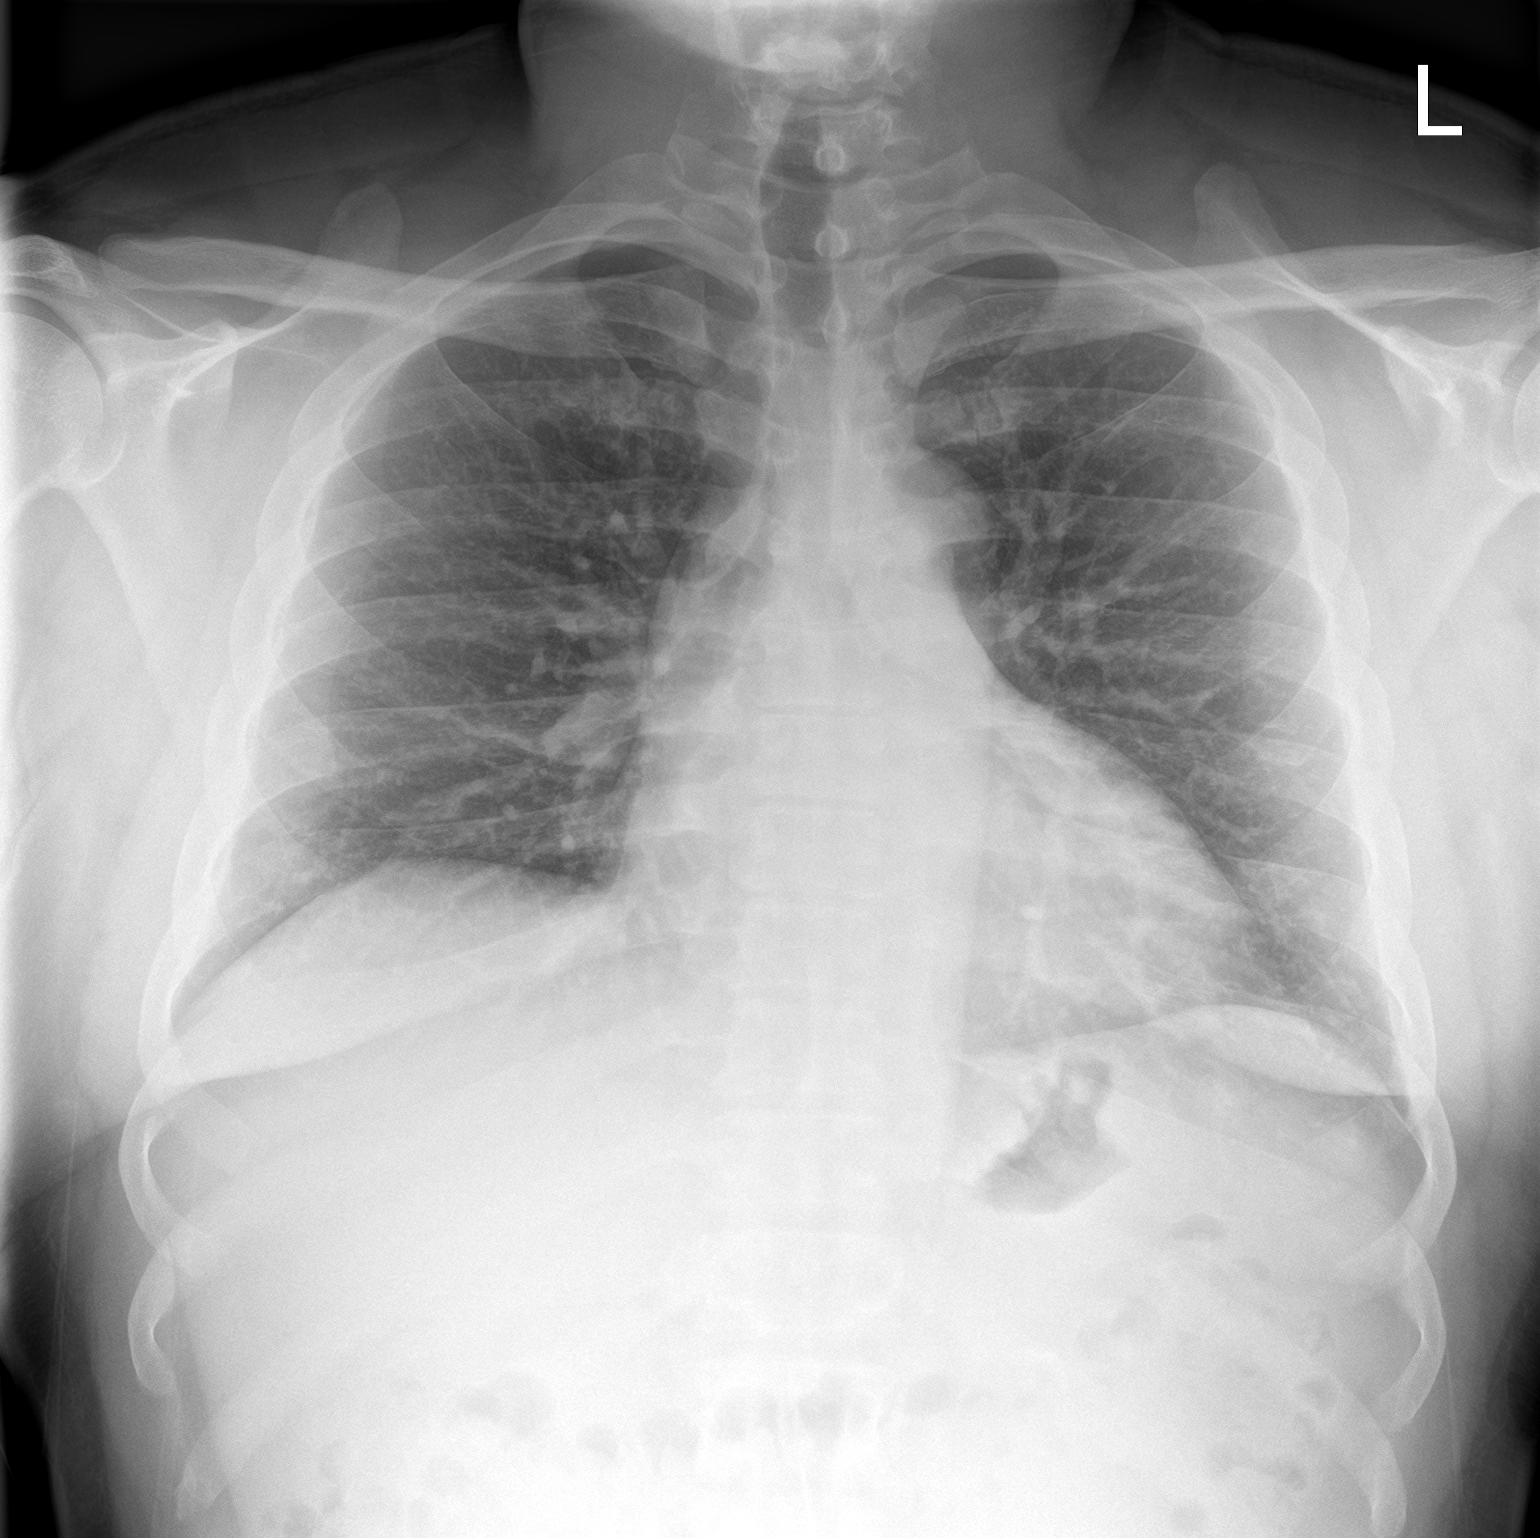

[chest lat]
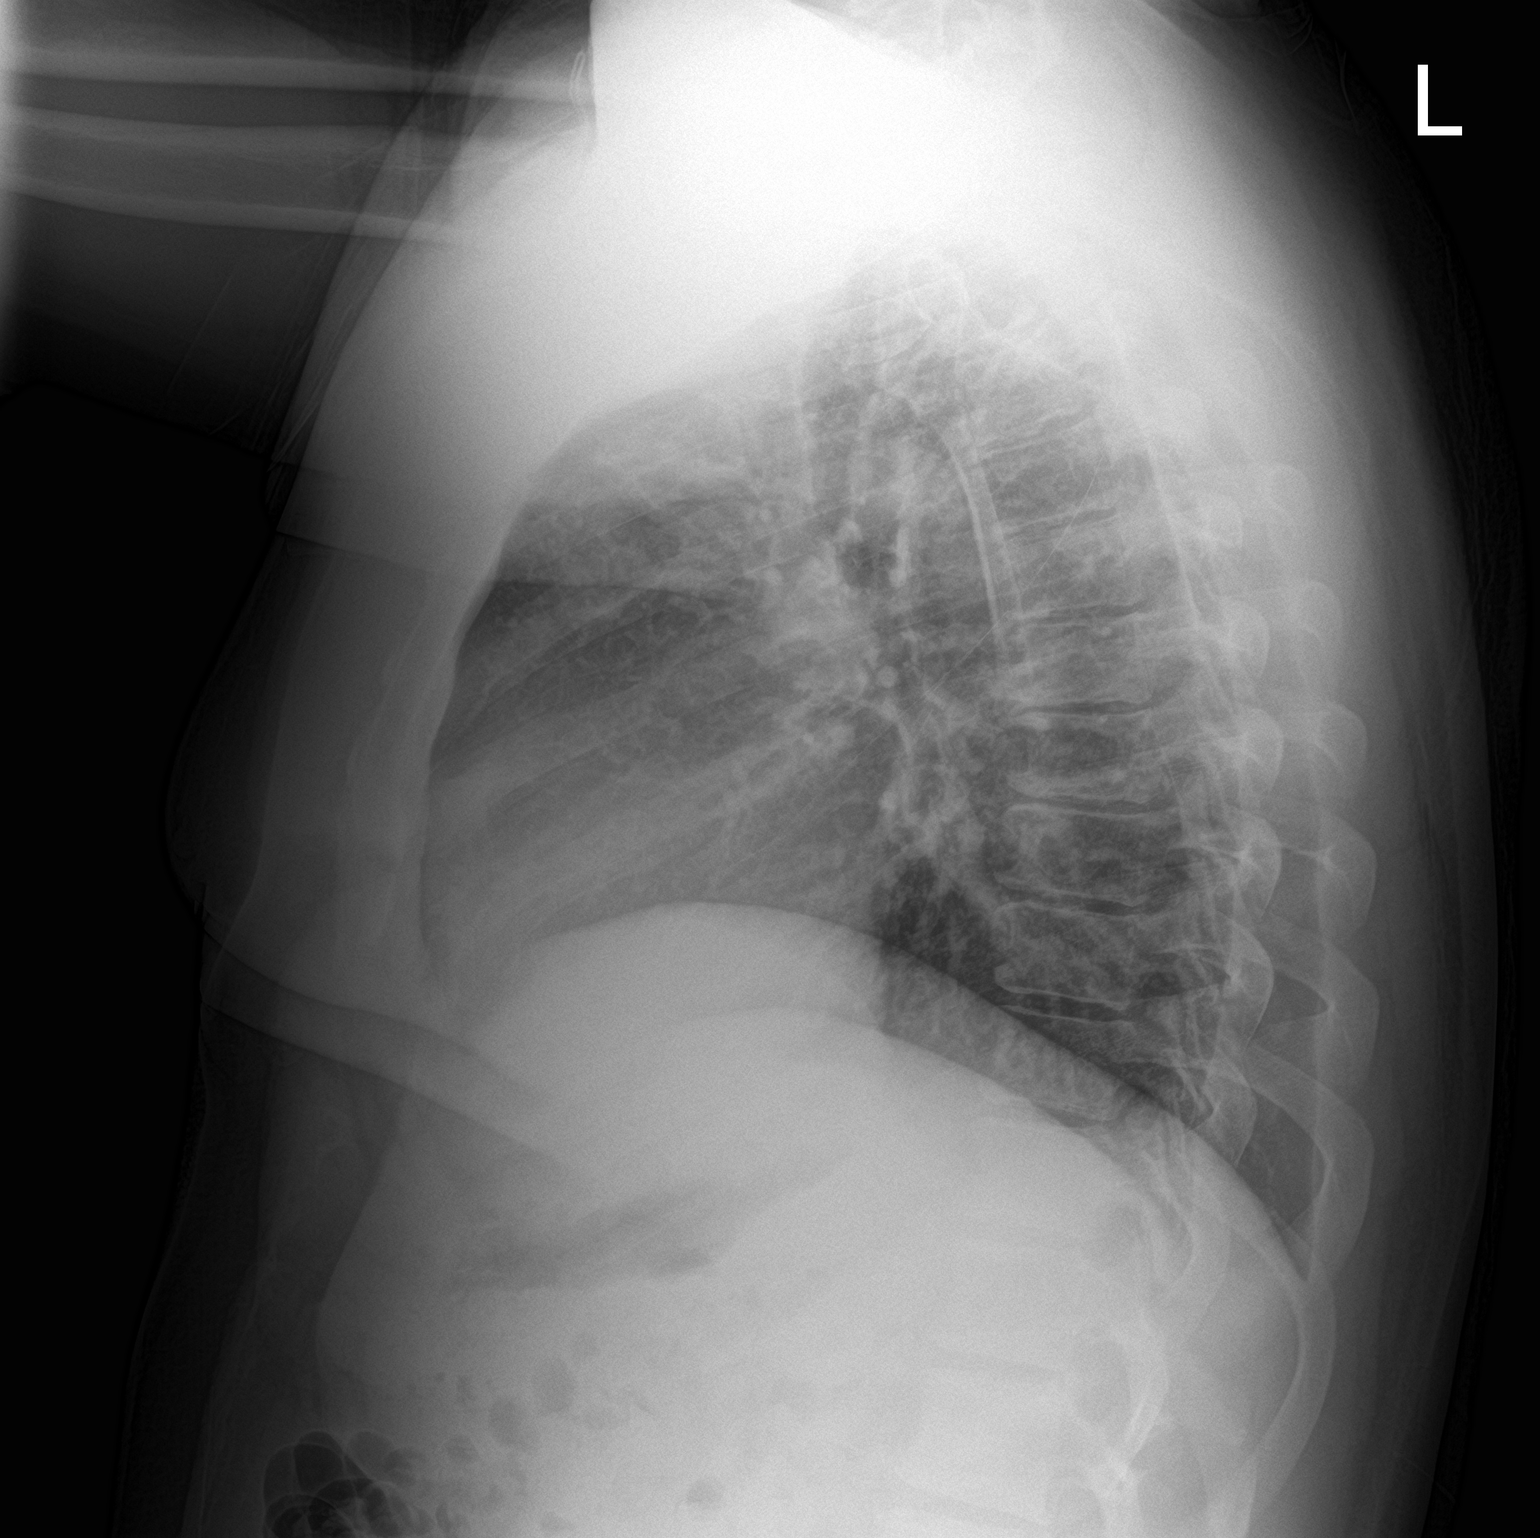

[2 of 2 positions shown; findings below may reference images not displayed]

FINDINGS: The heart size and mediastinal contours are within normal limits.
Both lungs are clear. The visualized skeletal structures are
unremarkable.
IMPRESSION: No active cardiopulmonary disease.

## 2022-04-26 ENCOUNTER — Emergency Department (HOSPITAL_COMMUNITY)
Admission: EM | Admit: 2022-04-26 | Discharge: 2022-04-26 | Disposition: A | Discharge status: Home / Self Care | Payer: Self-pay | Attending: Emergency Medicine | Admitting: Emergency Medicine

## 2022-04-26 ENCOUNTER — Emergency Department (HOSPITAL_COMMUNITY): Payer: Self-pay

## 2022-04-26 DIAGNOSIS — R0789 Other chest pain: Secondary | ICD-10-CM

## 2022-04-26 DIAGNOSIS — I10 Essential (primary) hypertension: Secondary | ICD-10-CM | POA: Insufficient documentation

## 2022-04-26 DIAGNOSIS — Z79899 Other long term (current) drug therapy: Secondary | ICD-10-CM | POA: Insufficient documentation

## 2022-04-26 LAB — CBC WITH DIFFERENTIAL/PLATELET
Abs Immature Granulocytes: 0.02 10*3/uL (ref 0.00–0.07)
Basophils Absolute: 0.1 10*3/uL (ref 0.0–0.1)
Basophils Relative: 1 %
Eosinophils Absolute: 0.2 10*3/uL (ref 0.0–0.5)
Eosinophils Relative: 2 %
HCT: 45.8 % (ref 39.0–52.0)
Hemoglobin: 15.9 g/dL (ref 13.0–17.0)
Immature Granulocytes: 0 %
Lymphocytes Relative: 42 %
Lymphs Abs: 3.3 10*3/uL (ref 0.7–4.0)
MCH: 34 pg (ref 26.0–34.0)
MCHC: 34.7 g/dL (ref 30.0–36.0)
MCV: 97.9 fL (ref 80.0–100.0)
Monocytes Absolute: 0.7 10*3/uL (ref 0.1–1.0)
Monocytes Relative: 9 %
Neutro Abs: 3.6 10*3/uL (ref 1.7–7.7)
Neutrophils Relative %: 46 %
Platelets: 145 10*3/uL — ABNORMAL LOW (ref 150–400)
RBC: 4.68 MIL/uL (ref 4.22–5.81)
RDW: 13.2 % (ref 11.5–15.5)
WBC: 7.8 10*3/uL (ref 4.0–10.5)
nRBC: 0 % (ref 0.0–0.2)

## 2022-04-26 LAB — BASIC METABOLIC PANEL
Anion gap: 7 (ref 5–15)
BUN: 15 mg/dL (ref 6–20)
CO2: 25 mmol/L (ref 22–32)
Calcium: 9.1 mg/dL (ref 8.9–10.3)
Chloride: 107 mmol/L (ref 98–111)
Creatinine, Ser: 1.14 mg/dL (ref 0.61–1.24)
GFR, Estimated: >60 mL/min (ref >60)
Glucose, Bld: 98 mg/dL (ref 70–99)
Potassium: 3.6 mmol/L (ref 3.5–5.1)
Sodium: 139 mmol/L (ref 135–145)

## 2022-04-26 LAB — TROPONIN I (HIGH SENSITIVITY)
Troponin I (High Sensitivity): 6 ng/L (ref <18)
Troponin I (High Sensitivity): 6 ng/L (ref <18)

## 2022-04-26 MED ORDER — AMLODIPINE BESYLATE 5 MG PO TABS
10.0000 mg | ORAL_TABLET | Freq: Once | ORAL | Status: AC
  Administered 2022-04-26: 10 mg via ORAL
  Filled 2022-04-26: qty 2

## 2022-04-26 MED ORDER — HYDROCHLOROTHIAZIDE 25 MG PO TABS: 25.0000 mg | ORAL_TABLET | Freq: Every day | ORAL | 0 refills | Status: DC

## 2022-04-26 MED ORDER — AMLODIPINE BESYLATE 10 MG PO TABS: 10.0000 mg | ORAL_TABLET | Freq: Every day | ORAL | 0 refills | Status: DC

## 2022-04-26 NOTE — Discharge Instructions (Signed)
You were seen today for high blood pressure and chest discomfort.  Your chest pain work-up is reassuring.  Start medications as prescribed.  Referral was placed for cardiology.  If you have new or worsening symptoms, you should be reevaluated.

## 2022-04-26 NOTE — ED Provider Notes (Signed)
[facility] EMERGENCY DEPARTMENT Provider Note   CSN: [id number] Arrival date & time: 04/26/22  0135     History  Chief Complaint  Patient presents with   Chest Pain   Dizziness    Gerald Phillips is a 41 y.o. male.  HPI     This 41 year old male with a history of hypertension who presents with intermittent lightheadedness and chest discomfort.  Patient reports over the last 4 to 5 days he has had intermittent episodes of chest discomfort.  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  The pain is not exertional in nature and he feels it is positional.  He has not had any shortness of breath or diaphoresis.  He has a history of high blood pressure.  He reports he takes amlodipine 5 mg daily.  He describes occasional lightheaded feeling.  Denies room spinning dizziness.  Denies any recent illnesses or fevers.  Home Medications Prior to Admission medications   Medication Sig Start Date End Date Taking? Authorizing Provider  hydrochlorothiazide (HYDRODIURIL) 25 MG tablet Take 1 tablet (25 mg total) by mouth daily. 04/26/22  Yes Glenna Brunkow, Mayer Masker, MD  acetaminophen (TYLENOL) 500 MG tablet Take 2 tablets (1,000 mg total) by mouth every 6 (six) hours as needed for mild pain or fever. Patient not taking: Reported on 09/10/2019 08/31/19   Juliet Rude, PA-C  amLODipine (NORVASC) 10 MG tablet Take 1 tablet (10 mg total) by mouth daily. 04/26/22   Latrise Bowland, Mayer Masker, MD  baclofen (LIORESAL) 10 MG tablet Take 0.5-1 tablets (5-10 mg total) by mouth 3 (three) times daily as needed for muscle spasms. 05/17/20   Hilts, Casimiro Needle, MD  celecoxib (CELEBREX) 200 MG capsule Take 1 capsule (200 mg total) by mouth 2 (two) times daily as needed. 05/17/20   Hilts, Casimiro Needle, MD  docusate sodium (COLACE) 100 MG capsule Take 1 capsule (100 mg total) by mouth 2 (two) times daily as needed for mild constipation. Patient not taking: Reported  on 09/10/2019 08/31/19   Juliet Rude, PA-C  gabapentin (NEURONTIN) 100 MG capsule  05/05/19   [provider]  lisinopril (ZESTRIL) 20 MG tablet Take 1 tablet (20 mg total) by mouth daily. 11/03/19   Burgess Amor, PA-C  meclizine (ANTIVERT) 25 MG tablet Take 1 tablet (25 mg total) by mouth 3 (three) times daily as needed for dizziness. 11/03/19   Burgess Amor, PA-C  metaxalone (SKELAXIN) 800 MG tablet Take 800 mg by mouth 3 (three) times daily. 05/15/20   [provider]  methocarbamol (ROBAXIN) 500 MG tablet Take 2 tablets (1,000 mg total) by mouth every 8 (eight) hours as needed for muscle spasms. Patient not taking: Reported on 09/10/2019 08/31/19   Juliet Rude, PA-C  omeprazole (PRILOSEC) 20 MG capsule Take 1 capsule (20 mg total) by mouth daily as needed. 09/10/19   Hilts, Casimiro Needle, MD  oxyCODONE 10 MG TABS Take 0.5-1 tablets (5-10 mg total) by mouth every 6 (six) hours as needed for moderate pain or severe pain. Patient not taking: Reported on 09/10/2019 08/31/19   Juliet Rude, PA-C  polyethylene glycol (MIRALAX / GLYCOLAX) 17 g packet Take 17 g by mouth daily as needed for mild constipation. Patient not taking: Reported on 09/10/2019 08/31/19   Juliet Rude, PA-C  predniSONE (STERAPRED UNI-PAK 21 TAB) 5 MG (21) TBPK tablet Take by mouth. 05/15/20   [provider]  Allergies    Penicillins and Penicillins    Review of Systems   Review of Systems  Constitutional:  Negative for fever.  Respiratory:  Negative for shortness of breath.   Cardiovascular:  Positive for chest pain.  Neurological:  Positive for light-headedness.  All other systems reviewed and are negative.   Physical Exam Updated Vital Signs BP (!) 173/109   Pulse 64   Temp 97.8 F (36.6 C) (Oral)   Resp 19   SpO2 94%  Physical Exam Vitals and nursing note reviewed.  Constitutional:      Appearance: He is well-developed. He is obese. He is not ill-appearing.  HENT:     Head:  Normocephalic and atraumatic.  Eyes:     Extraocular Movements: Extraocular movements intact.     Pupils: Pupils are equal, round, and reactive to light.  Cardiovascular:     Rate and Rhythm: Normal rate and regular rhythm.     Heart sounds: Normal heart sounds. No murmur heard. Pulmonary:     Effort: Pulmonary effort is normal. No respiratory distress.     Breath sounds: Normal breath sounds. No wheezing.  Abdominal:     General: Bowel sounds are normal.     Palpations: Abdomen is soft.     Tenderness: There is no abdominal tenderness. There is no rebound.  Musculoskeletal:     Cervical back: Neck supple.     Right lower leg: No edema.     Left lower leg: No edema.  Lymphadenopathy:     Cervical: No cervical adenopathy.  Skin:    General: Skin is warm and dry.  Neurological:     Mental Status: He is alert and oriented to person, place, and time.     Comments: Fluent speech, normal gait     ED Results / Procedures / Treatments   Labs (all labs ordered are listed, but only abnormal results are displayed) Labs Reviewed  CBC WITH DIFFERENTIAL/PLATELET - Abnormal; Notable for the following components:      Result Value   Platelets 145 (*)    All other components within normal limits  BASIC METABOLIC PANEL  TROPONIN I (HIGH SENSITIVITY)  TROPONIN I (HIGH SENSITIVITY)    EKG EKG Interpretation  Date/Time:  Friday April 26 2022 02:09:03 EDT Ventricular Rate:  65 PR Interval:  165 QRS Duration: 88 QT Interval:  401 QTC Calculation: 417 R Axis:   -1 Text Interpretation: Sinus rhythm Probable anteroseptal infarct, old Borderline T abnormalities, inferior leads Confirmed by Ross Marcus ([id number]) on 04/26/2022 3:20:12 AM  Radiology DG Chest Portable 1 View  Result Date: 04/26/2022 CLINICAL DATA:  Chest pain EXAM: PORTABLE CHEST 1 VIEW COMPARISON:  Radiographs 06/12/2021 FINDINGS: The heart size and mediastinal contours are within normal limits. Both lungs are clear.  The visualized skeletal structures are unremarkable. IMPRESSION: No active disease. Electronically Signed   By: Minerva Fester M.D.   On: 04/26/2022 02:27    Procedures Procedures    Medications Ordered in ED Medications  amLODipine (NORVASC) tablet 10 mg (10 mg Oral Given 04/26/22 0259)    ED Course/ Medical Decision Making/ A&P                           Medical Decision Making Amount and/or Complexity of Data Reviewed Labs: ordered. Radiology: ordered.  Risk Prescription drug management.   This patient presents to the ED for concern of lightheadedness, chest pain, this involves an extensive number of treatment  options, and is a complaint that carries with it a high risk of complications and morbidity.  I considered the following differential and admission for this acute, potentially life threatening condition.  The differential diagnosis includes ACS, pneumothorax, pneumonia, orthostasis, uncontrolled hypertension  MDM:    This is a 41 year old male who presents with lightheadedness and chest discomfort.  He is nontoxic and vital signs are notable for initial blood pressure 196/120.  Patient reports he normally takes blood pressure medicine in the morning.  Currently he is symptom-free.  He does not describe vertiginous symptoms but more lightheadedness.  His chest pain is atypical.  EKG shows no evidence of acute arrhythmia or ischemia.  Chest x-ray without pneumothorax or pneumonia.  Troponin x2 6.  Low suspicion for ACS although the patient does have some risk factors.  Basic lab work reassuring.  Patient was given 10 mg dose of Norvasc.  Blood pressure trended down to 173/109.  Patient remained asymptomatic.  Will increase Norvasc to 10 mg daily and add HCTZ.  Ambulatory referral to cardiology was placed.  (Labs, imaging, consults)  Labs: I Ordered, and personally interpreted labs.  The pertinent results include: CBC, BMP, troponin x2  Imaging Studies ordered: I ordered  imaging studies including chest x-ray I independently visualized and interpreted imaging. I agree with the radiologist interpretation  Additional history obtained from family at bedside.  External records from outside source obtained and reviewed including prior evaluations  Cardiac Monitoring: The patient was maintained on a cardiac monitor.  I personally viewed and interpreted the cardiac monitored which showed an underlying rhythm of: Sinus rhythm  Reevaluation: After the interventions noted above, I reevaluated the patient and found that they have :resolved  Social Determinants of Health: Lives independently  Disposition: Discharge  Co morbidities that complicate the patient evaluation  Past Medical History:  Diagnosis Date   GERD (gastroesophageal reflux disease)    Gout    Hypertension      Medicines Meds ordered this encounter  Medications   amLODipine (NORVASC) tablet 10 mg   amLODipine (NORVASC) 10 MG tablet    Sig: Take 1 tablet (10 mg total) by mouth daily.    Dispense:  30 tablet    Refill:  0    PATIENT NEEDS REFILLS   hydrochlorothiazide (HYDRODIURIL) 25 MG tablet    Sig: Take 1 tablet (25 mg total) by mouth daily.    Dispense:  30 tablet    Refill:  0    I have reviewed the patients home medicines and have made adjustments as needed  Problem List / ED Course: Problem List Items Addressed This Visit   None Visit Diagnoses     Uncontrolled hypertension    -  Primary   Relevant Medications   amLODipine (NORVASC) tablet 10 mg (Completed)   amLODipine (NORVASC) 10 MG tablet   hydrochlorothiazide (HYDRODIURIL) 25 MG tablet   Other Relevant Orders   Ambulatory referral to Cardiology   Atypical chest pain       Relevant Orders   Ambulatory referral to Cardiology                   Final Clinical Impression(s) / ED Diagnoses Final diagnoses:  Uncontrolled hypertension  Atypical chest pain    Rx / DC Orders ED Discharge Orders           Ordered    amLODipine (NORVASC) 10 MG tablet  Daily       Note to Pharmacy: PATIENT NEEDS REFILLS  04/26/22 0510    hydrochlorothiazide (HYDRODIURIL) 25 MG tablet  Daily        04/26/22 0510    Ambulatory referral to Cardiology        04/26/22 0510              Merryl Hacker, MD 04/26/22 [phone]

## 2022-04-26 NOTE — ED Triage Notes (Signed)
Pt arrived from home via POV w c/o chest pain that has been intermittent for a few days. Tonight it was accompanied with dizziness and SOB.

## 2022-04-29 ENCOUNTER — Other Ambulatory Visit: Payer: Self-pay

## 2022-04-29 ENCOUNTER — Encounter (HOSPITAL_COMMUNITY): Payer: Self-pay | Admitting: *Deleted

## 2022-04-29 ENCOUNTER — Emergency Department (HOSPITAL_COMMUNITY)
Admission: EM | Admit: 2022-04-29 | Discharge: 2022-04-30 | Disposition: A | Discharge status: Home / Self Care | Payer: Self-pay | Attending: Emergency Medicine | Admitting: Emergency Medicine

## 2022-04-29 DIAGNOSIS — Z79899 Other long term (current) drug therapy: Secondary | ICD-10-CM | POA: Insufficient documentation

## 2022-04-29 DIAGNOSIS — R42 Dizziness and giddiness: Secondary | ICD-10-CM

## 2022-04-29 DIAGNOSIS — I10 Essential (primary) hypertension: Secondary | ICD-10-CM | POA: Insufficient documentation

## 2022-04-29 DIAGNOSIS — F410 Panic disorder [episodic paroxysmal anxiety] without agoraphobia: Secondary | ICD-10-CM | POA: Insufficient documentation

## 2022-04-29 NOTE — ED Triage Notes (Signed)
Pt BIB [facility] for dizziness, states it's worse now.  C/o HA as well. Pt seen on 10/20 for same and CP, had some CP earlier today-tightness on left chest. Pt with unfilled prescription for HCTZ.

## 2022-04-30 ENCOUNTER — Emergency Department (HOSPITAL_COMMUNITY): Payer: Self-pay

## 2022-04-30 LAB — BASIC METABOLIC PANEL
Anion gap: 7 (ref 5–15)
BUN: 14 mg/dL (ref 6–20)
CO2: 25 mmol/L (ref 22–32)
Calcium: 8.9 mg/dL (ref 8.9–10.3)
Chloride: 107 mmol/L (ref 98–111)
Creatinine, Ser: 1.02 mg/dL (ref 0.61–1.24)
GFR, Estimated: >60 mL/min (ref >60)
Glucose, Bld: 90 mg/dL (ref 70–99)
Potassium: 3.9 mmol/L (ref 3.5–5.1)
Sodium: 139 mmol/L (ref 135–145)

## 2022-04-30 LAB — TROPONIN I (HIGH SENSITIVITY): Troponin I (High Sensitivity): 7 ng/L (ref <18)

## 2022-04-30 MED ORDER — HYDROCHLOROTHIAZIDE 25 MG PO TABS: 25.0000 mg | ORAL_TABLET | Freq: Every day | ORAL | 1 refills | Status: DC

## 2022-04-30 MED ORDER — LORAZEPAM 1 MG PO TABS
1.0000 mg | ORAL_TABLET | Freq: Once | ORAL | Status: AC
  Administered 2022-04-30: 1 mg via ORAL
  Filled 2022-04-30: qty 1

## 2022-04-30 MED ORDER — LORAZEPAM 1 MG PO TABS: 1.0000 mg | ORAL_TABLET | Freq: Every day | ORAL | 0 refills | Status: DC | PRN

## 2022-04-30 NOTE — ED Provider Notes (Signed)
[facility] EMERGENCY DEPARTMENT Provider Note   CSN: [id number] Arrival date & time: 04/29/22  1855     History  Chief Complaint  Patient presents with   Dizziness    Gerald Phillips is a 41 y.o. male.  Multiple symtpoms today to include rapid breathing, chest pain, light headedness and heart racing associated with a self described panic attack. States he gets ha/cp intermittently likely related to HTN, seen here previously for the same but hasn't started HCTZ yet. No edema. No other associated symptoms. Wife states that he doesn't sleep well as she is often awoken by him gasping for air like he is out of breath. Stats he had some type of tst for possible sleep apnea many years ago but doesn't know the results and isn't sure who did the test. Doesn't regularly see PCP.    Dizziness      Home Medications Prior to Admission medications   Medication Sig Start Date End Date Taking? Authorizing Provider  LORazepam (ATIVAN) 1 MG tablet Take 1 tablet (1 mg total) by mouth daily as needed for anxiety (panic attack). 04/30/22  Yes Martasia Talamante, Corene Cornea, MD  acetaminophen (TYLENOL) 500 MG tablet Take 2 tablets (1,000 mg total) by mouth every 6 (six) hours as needed for mild pain or fever. Patient not taking: Reported on 09/10/2019 08/31/19   Norm Parcel, PA-C  amLODipine (NORVASC) 10 MG tablet Take 1 tablet (10 mg total) by mouth daily. 04/26/22   Horton, Barbette Hair, MD  baclofen (LIORESAL) 10 MG tablet Take 0.5-1 tablets (5-10 mg total) by mouth 3 (three) times daily as needed for muscle spasms. 05/17/20   Hilts, Legrand Como, MD  celecoxib (CELEBREX) 200 MG capsule Take 1 capsule (200 mg total) by mouth 2 (two) times daily as needed. 05/17/20   Hilts, Legrand Como, MD  docusate sodium (COLACE) 100 MG capsule Take 1 capsule (100 mg total) by mouth 2 (two) times daily as needed for mild constipation. Patient not taking: Reported on 09/10/2019 08/31/19   Norm Parcel, PA-C  gabapentin (NEURONTIN) 100 MG  capsule  05/05/19   [provider]  hydrochlorothiazide (HYDRODIURIL) 25 MG tablet Take 1 tablet (25 mg total) by mouth daily. 04/30/22   Evelyn Aguinaldo, Corene Cornea, MD  lisinopril (ZESTRIL) 20 MG tablet Take 1 tablet (20 mg total) by mouth daily. 11/03/19   Evalee Jefferson, PA-C  meclizine (ANTIVERT) 25 MG tablet Take 1 tablet (25 mg total) by mouth 3 (three) times daily as needed for dizziness. 11/03/19   Evalee Jefferson, PA-C  metaxalone (SKELAXIN) 800 MG tablet Take 800 mg by mouth 3 (three) times daily. 05/15/20   [provider]  methocarbamol (ROBAXIN) 500 MG tablet Take 2 tablets (1,000 mg total) by mouth every 8 (eight) hours as needed for muscle spasms. Patient not taking: Reported on 09/10/2019 08/31/19   Norm Parcel, PA-C  omeprazole (PRILOSEC) 20 MG capsule Take 1 capsule (20 mg total) by mouth daily as needed. 09/10/19   Hilts, Legrand Como, MD  oxyCODONE 10 MG TABS Take 0.5-1 tablets (5-10 mg total) by mouth every 6 (six) hours as needed for moderate pain or severe pain. Patient not taking: Reported on 09/10/2019 08/31/19   Norm Parcel, PA-C  polyethylene glycol (MIRALAX / GLYCOLAX) 17 g packet Take 17 g by mouth daily as needed for mild constipation. Patient not taking: Reported on 09/10/2019 08/31/19   Norm Parcel, PA-C  predniSONE (STERAPRED UNI-PAK 21 TAB) 5 MG (21) TBPK tablet Take by mouth. 05/15/20  [provider]      Allergies    Penicillins and Penicillins    Review of Systems   Review of Systems  Neurological:  Positive for dizziness.    Physical Exam Updated Vital Signs BP (!) 159/102   Pulse (!) 58   Temp 98.7 F (37.1 C) (Oral)   Resp 18   Ht 5\' 10"  (1.778 m)   Wt 108.9 kg   SpO2 96%   BMI 34.44 kg/m  Physical Exam Vitals and nursing note reviewed.  Constitutional:      Appearance: He is well-developed.  HENT:     Head: Normocephalic and atraumatic.     Mouth/Throat:     Mouth: Mucous membranes are moist.     Pharynx: Oropharynx is clear.   Eyes:     Pupils: Pupils are equal, round, and reactive to light.  Cardiovascular:     Rate and Rhythm: Normal rate.  Pulmonary:     Effort: Pulmonary effort is normal. No respiratory distress.  Abdominal:     General: Abdomen is flat. There is no distension.  Musculoskeletal:        General: Normal range of motion.     Cervical back: Normal range of motion.  Skin:    General: Skin is warm and dry.  Neurological:     General: No focal deficit present.     Mental Status: He is alert.     ED Results / Procedures / Treatments   Labs (all labs ordered are listed, but only abnormal results are displayed) Labs Reviewed  BASIC METABOLIC PANEL  TROPONIN I (HIGH SENSITIVITY)    EKG None  Radiology CT Head Wo Contrast  Result Date: 04/30/2022 CLINICAL DATA:  Headache, sudden, severe.  Dizziness. EXAM: CT HEAD WITHOUT CONTRAST TECHNIQUE: Contiguous axial images were obtained from the base of the skull through the vertex without intravenous contrast. RADIATION DOSE REDUCTION: This exam was performed according to the departmental dose-optimization program which includes automated exposure control, adjustment of the mA and/or kV according to patient size and/or use of iterative reconstruction technique. COMPARISON:  11/03/2019. FINDINGS: Brain: No acute intracranial hemorrhage, midline shift or mass effect. No extra-axial fluid collection. Gray-white matter differentiation is within normal limits. No hydrocephalus. Vascular: No hyperdense vessel or unexpected calcification. Skull: Normal. Negative for fracture or focal lesion. Sinuses/Orbits: No acute finding. Other: None. IMPRESSION: No acute intracranial process. Electronically Signed   By: 11/05/2019 M.D.   On: 04/30/2022 01:34    Procedures Procedures    Medications Ordered in ED Medications  LORazepam (ATIVAN) tablet 1 mg (1 mg Oral Given 04/30/22 0050)    ED Course/ Medical Decision Making/ A&P                            Medical Decision Making Amount and/or Complexity of Data Reviewed Labs: ordered. Radiology: ordered.  Risk Prescription drug management.   Recheck troponin/kidney function and no change from a few days ago.  CT head done and showed no obvious abnormalities (independently viewed and interpreted by myself and radiology read reviewed).  Suspect he has OSA which is leading to his HTN which is causing his intermittent HA/CP. Sounds like the panic attack today was related to the symptoms above, feels better with the ativan provided here. Rx for HCTZ provided at home to help ensure he gets it started. Discussed the need for  a formal sleep study and referral for the same.  Final  Clinical Impression(s) / ED Diagnoses Final diagnoses:  Dizziness  Hypertension, unspecified type    Rx / DC Orders ED Discharge Orders          Ordered    LORazepam (ATIVAN) 1 MG tablet  Daily PRN        04/30/22 0221    hydrochlorothiazide (HYDRODIURIL) 25 MG tablet  Daily        04/30/22 0221    Ambulatory referral to Neurology       Comments: An appointment is requested in approximately: 2 weeks   04/30/22 0222              Idara Woodside, Barbara Cower, MD 04/30/22 [phone]

## 2022-04-30 NOTE — ED Notes (Signed)
Patient transported to CT 

## 2022-04-30 NOTE — ED Notes (Signed)
ED Provider at bedside. 

## 2022-05-23 ENCOUNTER — Emergency Department (HOSPITAL_COMMUNITY): Payer: 59

## 2022-05-23 ENCOUNTER — Emergency Department (HOSPITAL_COMMUNITY)
Admission: EM | Admit: 2022-05-23 | Discharge: 2022-05-24 | Disposition: A | Discharge status: Home / Self Care | Payer: 59 | Attending: Emergency Medicine | Admitting: Emergency Medicine

## 2022-05-23 ENCOUNTER — Encounter (HOSPITAL_COMMUNITY): Payer: Self-pay

## 2022-05-23 ENCOUNTER — Other Ambulatory Visit: Payer: Self-pay

## 2022-05-23 DIAGNOSIS — Z79899 Other long term (current) drug therapy: Secondary | ICD-10-CM | POA: Diagnosis not present

## 2022-05-23 DIAGNOSIS — I10 Essential (primary) hypertension: Secondary | ICD-10-CM | POA: Diagnosis not present

## 2022-05-23 DIAGNOSIS — F410 Panic disorder [episodic paroxysmal anxiety] without agoraphobia: Secondary | ICD-10-CM | POA: Diagnosis not present

## 2022-05-23 DIAGNOSIS — R42 Dizziness and giddiness: Secondary | ICD-10-CM | POA: Insufficient documentation

## 2022-05-23 DIAGNOSIS — R0789 Other chest pain: Secondary | ICD-10-CM

## 2022-05-23 LAB — CBC WITH DIFFERENTIAL/PLATELET
Abs Immature Granulocytes: 0.02 10*3/uL (ref 0.00–0.07)
Basophils Absolute: 0 10*3/uL (ref 0.0–0.1)
Basophils Relative: 1 %
Eosinophils Absolute: 0.1 10*3/uL (ref 0.0–0.5)
Eosinophils Relative: 1 %
HCT: 47.2 % (ref 39.0–52.0)
Hemoglobin: 16.1 g/dL (ref 13.0–17.0)
Immature Granulocytes: 0 %
Lymphocytes Relative: 24 %
Lymphs Abs: 1.8 10*3/uL (ref 0.7–4.0)
MCH: 33.1 pg (ref 26.0–34.0)
MCHC: 34.1 g/dL (ref 30.0–36.0)
MCV: 96.9 fL (ref 80.0–100.0)
Monocytes Absolute: 0.6 10*3/uL (ref 0.1–1.0)
Monocytes Relative: 7 %
Neutro Abs: 5 10*3/uL (ref 1.7–7.7)
Neutrophils Relative %: 67 %
Platelets: 170 10*3/uL (ref 150–400)
RBC: 4.87 MIL/uL (ref 4.22–5.81)
RDW: 12.8 % (ref 11.5–15.5)
WBC: 7.5 10*3/uL (ref 4.0–10.5)
nRBC: 0 % (ref 0.0–0.2)

## 2022-05-23 MED ORDER — LORAZEPAM 1 MG PO TABS
1.0000 mg | ORAL_TABLET | Freq: Once | ORAL | Status: AC
  Administered 2022-05-24: 1 mg via ORAL
  Filled 2022-05-23: qty 1
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
## 2022-05-23 NOTE — ED Triage Notes (Signed)
POV from home. Cc of an anxiety attack, history of the same. States he has medications for it but did not take it. States it has been going on all day. Has that it makes his ears ring and have palpitations.

## 2022-05-24 DIAGNOSIS — F410 Panic disorder [episodic paroxysmal anxiety] without agoraphobia: Secondary | ICD-10-CM | POA: Diagnosis not present

## 2022-05-24 LAB — TROPONIN I (HIGH SENSITIVITY): Troponin I (High Sensitivity): 7 ng/L (ref <18)

## 2022-05-24 LAB — COMPREHENSIVE METABOLIC PANEL
ALT: 26 U/L (ref 0–44)
AST: 25 U/L (ref 15–41)
Albumin: 4.6 g/dL (ref 3.5–5.0)
Alkaline Phosphatase: 55 U/L (ref 38–126)
Anion gap: 8 (ref 5–15)
BUN: 14 mg/dL (ref 6–20)
CO2: 26 mmol/L (ref 22–32)
Calcium: 9.4 mg/dL (ref 8.9–10.3)
Chloride: 103 mmol/L (ref 98–111)
Creatinine, Ser: 1.04 mg/dL (ref 0.61–1.24)
GFR, Estimated: >60 mL/min (ref >60)
Glucose, Bld: 107 mg/dL — ABNORMAL HIGH (ref 70–99)
Potassium: 3.5 mmol/L (ref 3.5–5.1)
Sodium: 137 mmol/L (ref 135–145)
Total Bilirubin: 0.4 mg/dL (ref 0.3–1.2)
Total Protein: 7.6 g/dL (ref 6.5–8.1)

## 2022-05-24 MED ORDER — LORAZEPAM 1 MG PO TABS: 1.0000 mg | ORAL_TABLET | Freq: Four times a day (QID) | ORAL | 0 refills | Status: DC | PRN

## 2022-05-24 NOTE — ED Notes (Signed)
Went over dc papers. All questions answered. 

## 2022-05-24 NOTE — Discharge Instructions (Addendum)
Continue taking Ativan as prescribed as needed for anxiety.  An additional 10 pills have been prescribed this evening.  Follow-up with a primary doctor if symptoms are not improving in the next week.

## 2022-06-05 ENCOUNTER — Emergency Department (HOSPITAL_COMMUNITY): Payer: 59

## 2022-06-05 ENCOUNTER — Encounter (HOSPITAL_COMMUNITY): Payer: Self-pay | Admitting: *Deleted

## 2022-06-05 ENCOUNTER — Other Ambulatory Visit: Payer: Self-pay

## 2022-06-05 ENCOUNTER — Emergency Department (HOSPITAL_COMMUNITY)
Admission: EM | Admit: 2022-06-05 | Discharge: 2022-06-05 | Disposition: A | Discharge status: Home / Self Care | Payer: 59 | Attending: Emergency Medicine | Admitting: Emergency Medicine

## 2022-06-05 DIAGNOSIS — F17210 Nicotine dependence, cigarettes, uncomplicated: Secondary | ICD-10-CM | POA: Diagnosis not present

## 2022-06-05 DIAGNOSIS — I10 Essential (primary) hypertension: Secondary | ICD-10-CM | POA: Diagnosis not present

## 2022-06-05 DIAGNOSIS — F419 Anxiety disorder, unspecified: Secondary | ICD-10-CM | POA: Insufficient documentation

## 2022-06-05 DIAGNOSIS — R079 Chest pain, unspecified: Secondary | ICD-10-CM | POA: Diagnosis not present

## 2022-06-05 DIAGNOSIS — R69 Illness, unspecified: Secondary | ICD-10-CM | POA: Diagnosis not present

## 2022-06-05 DIAGNOSIS — Z79899 Other long term (current) drug therapy: Secondary | ICD-10-CM | POA: Diagnosis not present

## 2022-06-05 LAB — CBC
HCT: 48.1 % (ref 39.0–52.0)
Hemoglobin: 16.5 g/dL (ref 13.0–17.0)
MCH: 33.2 pg (ref 26.0–34.0)
MCHC: 34.3 g/dL (ref 30.0–36.0)
MCV: 96.8 fL (ref 80.0–100.0)
Platelets: 148 10*3/uL — ABNORMAL LOW (ref 150–400)
RBC: 4.97 MIL/uL (ref 4.22–5.81)
RDW: 12.9 % (ref 11.5–15.5)
WBC: 4.9 10*3/uL (ref 4.0–10.5)
nRBC: 0 % (ref 0.0–0.2)

## 2022-06-05 LAB — BASIC METABOLIC PANEL
Anion gap: 11 (ref 5–15)
BUN: 12 mg/dL (ref 6–20)
CO2: 26 mmol/L (ref 22–32)
Calcium: 9.2 mg/dL (ref 8.9–10.3)
Chloride: 101 mmol/L (ref 98–111)
Creatinine, Ser: 1.03 mg/dL (ref 0.61–1.24)
GFR, Estimated: >60 mL/min (ref >60)
Glucose, Bld: 107 mg/dL — ABNORMAL HIGH (ref 70–99)
Potassium: 3.9 mmol/L (ref 3.5–5.1)
Sodium: 138 mmol/L (ref 135–145)

## 2022-06-05 LAB — TROPONIN I (HIGH SENSITIVITY): Troponin I (High Sensitivity): 3 ng/L (ref <18)

## 2022-06-05 MED ORDER — HYDRALAZINE HCL 25 MG PO TABS
25.0000 mg | ORAL_TABLET | Freq: Once | ORAL | Status: AC
  Administered 2022-06-05: 25 mg via ORAL
  Filled 2022-06-05: qty 1

## 2022-06-05 MED ORDER — HYDRALAZINE HCL 25 MG PO TABS: 25.0000 mg | ORAL_TABLET | Freq: Three times a day (TID) | ORAL | 0 refills | Status: DC

## 2022-06-05 MED ORDER — LORAZEPAM 0.5 MG PO TABS
0.5000 mg | ORAL_TABLET | Freq: Once | ORAL | Status: AC
  Administered 2022-06-05: 0.5 mg via ORAL
  Filled 2022-06-05: qty 1

## 2022-06-05 MED ORDER — HYDROXYZINE HCL 25 MG PO TABS: 25.0000 mg | ORAL_TABLET | Freq: Four times a day (QID) | ORAL | 0 refills | Status: DC | PRN

## 2022-06-05 MED ORDER — AMLODIPINE BESYLATE 10 MG PO TABS: 10.0000 mg | ORAL_TABLET | Freq: Every day | ORAL | 1 refills | Status: DC

## 2022-06-05 NOTE — ED Triage Notes (Signed)
Pt in c/o panic attacks intermittently x 1 mth, pt brought in by [facility] from UC parking lot, pt reports being out of his BP meds today, A&O x4

## 2022-06-05 NOTE — ED Notes (Signed)
Per patient access, pt registering for UC and pt significant other on the phone with EMS. Pt steps outside after registration. This RN speaks with pt and reports EMS en route. Pt aware will take out of UC system. Patient access and NP aware.

## 2022-06-05 NOTE — Discharge Instructions (Signed)
We evaluated you for your chest pain and anxiety.  Your testing was reassuring including negative cardiac enzymes.  We have changed your blood pressure medication from hydrochlorothiazide to hydralazine.  Please take this 3 times daily.  Please continue to look for primary care doctor to follow-up with as an outpatient.  I have also prescribed you medication called hydroxyzine.  This is a different medicine than hydralazine.  Take this medicine every 6 hours as needed for anxiety.  It may make you sleepy so try not to drive when taking this medication.  Please return to the emergency department if you develop any worsening symptoms, severe pain, fainting, difficulty breathing, vomiting, sweating, or any other concerning symptoms.

## 2022-06-05 NOTE — ED Notes (Signed)
Patient transported to X-ray 

## 2022-06-05 NOTE — ED Notes (Addendum)
Pt states he is dizzy and lightheaded--MD made aware
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
## 2022-06-14 ENCOUNTER — Ambulatory Visit: Payer: 59 | Attending: Internal Medicine | Admitting: Internal Medicine

## 2022-06-14 ENCOUNTER — Encounter: Payer: Self-pay | Admitting: Internal Medicine

## 2022-06-14 VITALS — BP 166/102 | HR 98 | Ht 70.0 in | Wt 231.0 lb

## 2022-06-14 DIAGNOSIS — R42 Dizziness and giddiness: Secondary | ICD-10-CM | POA: Insufficient documentation

## 2022-06-14 DIAGNOSIS — R079 Chest pain, unspecified: Secondary | ICD-10-CM | POA: Insufficient documentation

## 2022-06-14 DIAGNOSIS — Z720 Tobacco use: Secondary | ICD-10-CM | POA: Diagnosis not present

## 2022-06-14 DIAGNOSIS — I10 Essential (primary) hypertension: Secondary | ICD-10-CM | POA: Diagnosis not present

## 2022-06-14 MED ORDER — MECLIZINE HCL 25 MG PO TABS: 25.0000 mg | ORAL_TABLET | Freq: Two times a day (BID) | ORAL | 0 refills | Status: DC

## 2022-06-14 MED ORDER — CHLORTHALIDONE 25 MG PO TABS: 25.0000 mg | ORAL_TABLET | Freq: Every day | ORAL | 3 refills | Status: DC

## 2022-06-14 MED ORDER — HYDROXYZINE HCL 10 MG PO TABS: 10.0000 mg | ORAL_TABLET | Freq: Two times a day (BID) | ORAL | 0 refills | Status: DC

## 2022-06-14 NOTE — Patient Instructions (Signed)
Medication Instructions:  Your physician has recommended you make the following change in your medication:  -Start Chlorthalidone 25 mg tablets once daily -Start Meclizine 25 mg tablets twice daily- Future refills must come from PCP -Start Atarax 10 mg tablets twice daily- Future refills must come from PCP   Labwork: None  Testing/Procedures: Your physician has requested that you have an echocardiogram. Echocardiography is a painless test that uses sound waves to create images of your heart. It provides your doctor with information about the size and shape of your heart and how well your heart's chambers and valves are working. This procedure takes approximately one hour. There are no restrictions for this procedure. Please do NOT wear cologne, perfume, aftershave, or lotions (deodorant is allowed). Please arrive 15 minutes prior to your appointment time.   Follow-Up: Follow up with Dr Jenene Slicker in 1 year.   Any Other Special Instructions Will Be Listed Below (If Applicable).     If you need a refill on your cardiac medications before your next appointment, please call your pharmacy. 25

## 2022-06-14 NOTE — Progress Notes (Signed)
Cardiology Office Note  Date: 06/14/2022   ID: Gerald Phillips, DOB 09/01/1980, MRN [id number]  PCP:  Pcp, No  Cardiologist:  Marjo Bicker, MD Electrophysiologist:  None   Reason for Office Visit: Evaluation of HTN and atypical chest pain at the request of Dr. Wilkie Aye, MD   History of Present Illness: Gerald Phillips is a 41 y.o. male known to have HTN, gout, GERD was referred to cardiology clinic for management of HTN and apical chest pain.  Patient started to have chest pains for the last 2 months happens whenever he has anxiety/panic attack. no relation with rest or exercise.  He had ER visit on 06/05/2022 when he was prescribed hydroxyzine and is running out of them.  He also has vertigo for which she is seeking medical attention.  He recently got his insurance.  Has PCP appointment next week. Currently on amlodipine 10 mg once daily for HTN management. Current smoker, smokes occasionally, denied alcohol use or illicit drug abuse. No family history premature ASCVD.  He checks his blood pressure at home and ranges in 130s SBP.   Past Medical History:  Diagnosis Date   GERD (gastroesophageal reflux disease)    Gout    Hypertension     Past Surgical History:  Procedure Laterality Date   LAPAROTOMY N/A 08/25/2019   Procedure: EXPLORATORY LAPAROTOMY, RENAL EXPLORATION CLOSURE OF RETROPERITONEUM;  Surgeon: Manus Rudd, MD;  Location: [facility] OR;  Service: General;  Laterality: N/A;    Current Outpatient Medications  Medication Sig Dispense Refill   amLODipine (NORVASC) 10 MG tablet Take 1 tablet (10 mg total) by mouth daily. 30 tablet 1   hydrOXYzine (ATARAX) 25 MG tablet Take 1 tablet (25 mg total) by mouth every 6 (six) hours as needed for anxiety. 12 tablet 0   LORazepam (ATIVAN) 1 MG tablet Take 1 tablet (1 mg total) by mouth every 6 (six) hours as needed for anxiety. 10 tablet 0   acetaminophen (TYLENOL) 500 MG tablet Take 2 tablets (1,000 mg total) by mouth every 6  (six) hours as needed for mild pain or fever. (Patient not taking: Reported on 09/10/2019)     baclofen (LIORESAL) 10 MG tablet Take 0.5-1 tablets (5-10 mg total) by mouth 3 (three) times daily as needed for muscle spasms. (Patient not taking: Reported on 06/05/2022) 30 each 3   celecoxib (CELEBREX) 200 MG capsule Take 1 capsule (200 mg total) by mouth 2 (two) times daily as needed. (Patient not taking: Reported on 06/05/2022) 60 capsule 6   docusate sodium (COLACE) 100 MG capsule Take 1 capsule (100 mg total) by mouth 2 (two) times daily as needed for mild constipation. (Patient not taking: Reported on 09/10/2019)     hydrALAZINE (APRESOLINE) 25 MG tablet Take 1 tablet (25 mg total) by mouth 3 (three) times daily. (Patient not taking: Reported on 06/14/2022) 90 tablet 0   lisinopril (ZESTRIL) 20 MG tablet Take 1 tablet (20 mg total) by mouth daily. (Patient not taking: Reported on 06/05/2022) 30 tablet 0   meclizine (ANTIVERT) 25 MG tablet Take 1 tablet (25 mg total) by mouth 3 (three) times daily as needed for dizziness. (Patient not taking: Reported on 06/05/2022) 30 tablet 0   metaxalone (SKELAXIN) 800 MG tablet Take 800 mg by mouth 3 (three) times daily. (Patient not taking: Reported on 06/05/2022)     methocarbamol (ROBAXIN) 500 MG tablet Take 2 tablets (1,000 mg total) by mouth every 8 (eight) hours as needed for muscle spasms. (Patient  not taking: Reported on 09/10/2019) 60 tablet 0   omeprazole (PRILOSEC) 20 MG capsule Take 1 capsule (20 mg total) by mouth daily as needed. (Patient not taking: Reported on 06/05/2022) 90 capsule 3   oxyCODONE 10 MG TABS Take 0.5-1 tablets (5-10 mg total) by mouth every 6 (six) hours as needed for moderate pain or severe pain. (Patient not taking: Reported on 09/10/2019) 30 tablet 0   polyethylene glycol (MIRALAX / GLYCOLAX) 17 g packet Take 17 g by mouth daily as needed for mild constipation. (Patient not taking: Reported on 09/10/2019)     predniSONE (STERAPRED UNI-PAK 21  TAB) 5 MG (21) TBPK tablet Take by mouth. (Patient not taking: Reported on 06/05/2022)     No current facility-administered medications for this visit.   Allergies:  Penicillins and Penicillins   Social History: The patient  reports that he has been smoking cigars and cigarettes. He has never used smokeless tobacco. He reports current alcohol use. He reports that he does not currently use drugs.   Family History: The patient's family history includes Cancer in his maternal grandmother; Heart attack in his maternal grandfather.   ROS:  Please see the history of present illness. Otherwise, complete review of systems is positive for none.  All other systems are reviewed and negative.   Physical Exam: VS:  BP (!) 166/102   Pulse 98   Ht 5\' 10"  (1.778 m)   Wt 231 lb (104.8 kg)   SpO2 97%   BMI 33.15 kg/m , BMI Body mass index is 33.15 kg/m.  Wt Readings from Last 3 Encounters:  06/14/22 231 lb (104.8 kg)  05/23/22 238 lb 1.6 oz (108 kg)  04/29/22 240 lb (108.9 kg)    General: Patient appears comfortable at rest. HEENT: Conjunctiva and lids normal, oropharynx clear with moist mucosa. Neck: Supple, no elevated JVP or carotid bruits, no thyromegaly. Lungs: Clear to auscultation, nonlabored breathing at rest. Cardiac: Regular rate and rhythm, no S3 or significant systolic murmur, no pericardial rub. Abdomen: Soft, nontender, no hepatomegaly, bowel sounds present, no guarding or rebound. Extremities: No pitting edema, distal pulses 2+. Skin: Warm and dry. Musculoskeletal: No kyphosis. Neuropsychiatric: Alert and oriented x3, affect grossly appropriate.  ECG:  NSR and no ST changes  Recent Labwork: 05/23/2022: ALT 26; AST 25 06/05/2022: BUN 12; Creatinine, Ser 1.03; Hemoglobin 16.5; Platelets 148; Potassium 3.9; Sodium 138  No results found for: "CHOL", "TRIG", "HDL", "CHOLHDL", "VLDL", "LDLCALC", "LDLDIRECT"  Other Studies Reviewed Today:   Assessment and Plan: Patient is a  41 year old M known to have HTN was referred to cardiology clinic for management of HTN and atypical chest pain.  # HTN, poorly controlled -Home blood pressures are in the range of 130s SBP on amlodipine 10 mg once daily -Continue amlodipine 10 mg once daily -Start chlorthalidone 25 mg once daily -Obtain 2D echocardiogram -Follow-up with PCP  # Chest pain likely secondary to anxiety/panic attack -Treat underlying etiology, anxiety/panic attack he has upcoming appointment with his PCP on 06/17/2022.  I refilled his hydroxyzine till he sees his PCP.  He will need to be adequately treated with anxiety medications and reevaluate his chest pain symptoms. No further cardiac testing is needed at this time.  # Nicotine abuse -Smoking cessation instruction/counseling given:  counseled patient on the dangers of tobacco use, advised patient to stop smoking, and reviewed strategies to maximize success   # Vertigo -Start p.o. meclizine 25 mg twice daily (no refills provided ) and follow-up with PCP.  I have spent a total of 45 minutes with patient reviewing chart, EKGs, labs and examining patient as well as establishing an assessment and plan that was discussed with the patient.  > 50% of time was spent in direct patient care.     Medication Adjustments/Labs and Tests Ordered: Current medicines are reviewed at length with the patient today.  Concerns regarding medicines are outlined above.   Tests Ordered: No orders of the defined types were placed in this encounter.   Medication Changes: No orders of the defined types were placed in this encounter.   Disposition:  Follow up  1 year  Signed, Yezenia Fredrick Verne Spurr, MD, 06/14/2022 11:03 AM    [facility] at [facility] [address] [place], [place] [address]

## 2022-06-17 ENCOUNTER — Ambulatory Visit: Payer: 59 | Admitting: Family

## 2022-06-20 ENCOUNTER — Encounter: Payer: Self-pay | Admitting: *Deleted

## 2022-06-21 ENCOUNTER — Ambulatory Visit (HOSPITAL_COMMUNITY): Admission: RE | Admit: 2022-06-21 | Payer: 59 | Source: Ambulatory Visit

## 2022-07-03 DIAGNOSIS — R42 Dizziness and giddiness: Secondary | ICD-10-CM | POA: Diagnosis not present

## 2022-07-03 DIAGNOSIS — R079 Chest pain, unspecified: Secondary | ICD-10-CM | POA: Diagnosis not present

## 2022-07-03 DIAGNOSIS — R519 Headache, unspecified: Secondary | ICD-10-CM | POA: Diagnosis not present

## 2022-07-03 DIAGNOSIS — K0889 Other specified disorders of teeth and supporting structures: Secondary | ICD-10-CM | POA: Diagnosis not present

## 2022-07-11 ENCOUNTER — Encounter: Payer: Self-pay | Admitting: Family Medicine

## 2022-07-11 ENCOUNTER — Ambulatory Visit (INDEPENDENT_AMBULATORY_CARE_PROVIDER_SITE_OTHER): Payer: Medicaid Other | Admitting: Family Medicine

## 2022-07-11 VITALS — BP 168/102 | HR 79 | Ht 70.0 in | Wt 217.0 lb

## 2022-07-11 DIAGNOSIS — Z1159 Encounter for screening for other viral diseases: Secondary | ICD-10-CM | POA: Diagnosis not present

## 2022-07-11 DIAGNOSIS — H9312 Tinnitus, left ear: Secondary | ICD-10-CM | POA: Diagnosis not present

## 2022-07-11 DIAGNOSIS — E54 Ascorbic acid deficiency: Secondary | ICD-10-CM | POA: Diagnosis not present

## 2022-07-11 DIAGNOSIS — E785 Hyperlipidemia, unspecified: Secondary | ICD-10-CM

## 2022-07-11 DIAGNOSIS — G4730 Sleep apnea, unspecified: Secondary | ICD-10-CM | POA: Diagnosis not present

## 2022-07-11 DIAGNOSIS — Z114 Encounter for screening for human immunodeficiency virus [HIV]: Secondary | ICD-10-CM | POA: Diagnosis not present

## 2022-07-11 DIAGNOSIS — G4733 Obstructive sleep apnea (adult) (pediatric): Secondary | ICD-10-CM | POA: Insufficient documentation

## 2022-07-11 DIAGNOSIS — I10 Essential (primary) hypertension: Secondary | ICD-10-CM | POA: Diagnosis not present

## 2022-07-11 DIAGNOSIS — R7301 Impaired fasting glucose: Secondary | ICD-10-CM

## 2022-07-11 DIAGNOSIS — E039 Hypothyroidism, unspecified: Secondary | ICD-10-CM

## 2022-07-11 DIAGNOSIS — F419 Anxiety disorder, unspecified: Secondary | ICD-10-CM | POA: Diagnosis not present

## 2022-07-11 MED ORDER — TELMISARTAN 40 MG PO TABS: 40.0000 mg | ORAL_TABLET | Freq: Every day | ORAL | 1 refills | Status: DC

## 2022-07-11 NOTE — Patient Instructions (Signed)
Please Follow up in 4 weeks for HTN management and complete blood pressure reading log before visit  Please take medications as prescribed. Visit the ER if symptoms get worse Follow for the Dash diet for HTN management

## 2022-07-11 NOTE — Assessment & Plan Note (Addendum)
Ringing of left ear for the past couple of weeks occasionally with dizziness and loss of balance. Otoscope Bilateral ears Tympanic membrane normal.  ENT referral placed.

## 2022-07-11 NOTE — Assessment & Plan Note (Signed)
Patient stated has sleep apnea for years and feels out of breath every time he goes to sleep. He's never had a sleep a study before. Referral to sleep study placed

## 2022-07-11 NOTE — Assessment & Plan Note (Signed)
Patient stated does not like taking Norvasc medication because it makes him feel flushed and last time took Norvasc medication about a week ago. Discontinued Norvasc pill and started  patient on telmisartan 40mg . Elaborated patient on the importance medication compliance and lifestyle modifications such as exercise and following a DASH diet Follow up in 4 weeks for HTN management
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
## 2022-07-12 ENCOUNTER — Other Ambulatory Visit: Payer: Self-pay

## 2022-07-12 ENCOUNTER — Emergency Department (HOSPITAL_COMMUNITY): Payer: Medicaid Other

## 2022-07-12 ENCOUNTER — Encounter (HOSPITAL_COMMUNITY): Payer: Self-pay | Admitting: Emergency Medicine

## 2022-07-12 ENCOUNTER — Emergency Department (HOSPITAL_COMMUNITY)
Admission: EM | Admit: 2022-07-12 | Discharge: 2022-07-12 | Disposition: A | Discharge status: Home / Self Care | Payer: Medicaid Other | Attending: Emergency Medicine | Admitting: Emergency Medicine

## 2022-07-12 DIAGNOSIS — R42 Dizziness and giddiness: Secondary | ICD-10-CM

## 2022-07-12 DIAGNOSIS — Z79899 Other long term (current) drug therapy: Secondary | ICD-10-CM | POA: Diagnosis not present

## 2022-07-12 DIAGNOSIS — H9313 Tinnitus, bilateral: Secondary | ICD-10-CM | POA: Insufficient documentation

## 2022-07-12 DIAGNOSIS — I10 Essential (primary) hypertension: Secondary | ICD-10-CM | POA: Diagnosis not present

## 2022-07-12 DIAGNOSIS — F419 Anxiety disorder, unspecified: Secondary | ICD-10-CM | POA: Diagnosis not present

## 2022-07-12 DIAGNOSIS — R002 Palpitations: Secondary | ICD-10-CM

## 2022-07-12 DIAGNOSIS — R202 Paresthesia of skin: Secondary | ICD-10-CM | POA: Diagnosis not present

## 2022-07-12 LAB — BASIC METABOLIC PANEL
Anion gap: 7 (ref 5–15)
BUN: 10 mg/dL (ref 6–20)
CO2: 29 mmol/L (ref 22–32)
Calcium: 9.6 mg/dL (ref 8.9–10.3)
Chloride: 103 mmol/L (ref 98–111)
Creatinine, Ser: 1.11 mg/dL (ref 0.61–1.24)
GFR, Estimated: >60 mL/min (ref >60)
Glucose, Bld: 107 mg/dL — ABNORMAL HIGH (ref 70–99)
Potassium: 4.1 mmol/L (ref 3.5–5.1)
Sodium: 139 mmol/L (ref 135–145)

## 2022-07-12 LAB — CBC
HCT: 46.5 % (ref 39.0–52.0)
Hemoglobin: 15.8 g/dL (ref 13.0–17.0)
MCH: 32.8 pg (ref 26.0–34.0)
MCHC: 34 g/dL (ref 30.0–36.0)
MCV: 96.5 fL (ref 80.0–100.0)
Platelets: 199 10*3/uL (ref 150–400)
RBC: 4.82 MIL/uL (ref 4.22–5.81)
RDW: 12.5 % (ref 11.5–15.5)
WBC: 7.3 10*3/uL (ref 4.0–10.5)
nRBC: 0 % (ref 0.0–0.2)

## 2022-07-12 LAB — TROPONIN I (HIGH SENSITIVITY): Troponin I (High Sensitivity): 3 ng/L (ref <18)

## 2022-07-12 LAB — CBG MONITORING, ED: Glucose-Capillary: 83 mg/dL (ref 70–99)

## 2022-07-12 MED ORDER — LORAZEPAM 1 MG PO TABS
1.0000 mg | ORAL_TABLET | Freq: Once | ORAL | Status: AC
  Administered 2022-07-12: 1 mg via ORAL
  Filled 2022-07-12: qty 1

## 2022-07-12 NOTE — ED Provider Notes (Signed)
[facility] EMERGENCY DEPARTMENT Provider Note   CSN: [id number] Arrival date & time: 07/12/22  1119     History  Chief Complaint  Patient presents with   Dizziness    Gerald Phillips is a 42 y.o. male.  Pt with hx htn, and anxiety, c/o palpitations, sense of heart racing, numbness about mouth and left arm/hand, ringing in ears. Symptoms present for past day. Indicates recent dental extraction  - denies complication and indicates no current pain to area. Indicates bp med recently changed and has taken today. Mild headache earlier, no acute, abrupt or severe head pain. No change in vision or speech. No weakness. No problems w walking, balance, coordination or normal functional ability. No chest pain or discomfort. No sob. No abd pain or nvd. No swelling.   The history is provided by the patient, medical records and a significant other.  Dizziness Associated symptoms: palpitations   Associated symptoms: no chest pain, no diarrhea, no hearing loss, no shortness of breath and no vomiting        Home Medications Prior to Admission medications   Medication Sig Start Date End Date Taking? Authorizing Provider  acetaminophen (TYLENOL) 500 MG tablet Take 2 tablets (1,000 mg total) by mouth every 6 (six) hours as needed for mild pain or fever. 08/31/19   Juliet Rude, PA-C  baclofen (LIORESAL) 10 MG tablet Take 0.5-1 tablets (5-10 mg total) by mouth 3 (three) times daily as needed for muscle spasms. 05/17/20   Hilts, Casimiro Needle, MD  hydrOXYzine (ATARAX) 10 MG tablet Take 1 tablet (10 mg total) by mouth in the morning and at bedtime. 06/14/22   Mallipeddi, Vishnu P, MD  LORazepam (ATIVAN) 1 MG tablet Take 1 tablet (1 mg total) by mouth every 6 (six) hours as needed for anxiety. 05/24/22   Geoffery Lyons, MD  telmisartan (MICARDIS) 40 MG tablet Take 1 tablet (40 mg total) by mouth daily. 07/11/22   Del Nigel Berthold, FNP      Allergies    Penicillins and Penicillins    Review of Systems    Review of Systems  Constitutional:  Negative for chills and fever.  HENT:  Negative for hearing loss and sore throat.   Eyes:  Negative for pain, redness and visual disturbance.  Respiratory:  Negative for cough and shortness of breath.   Cardiovascular:  Positive for palpitations. Negative for chest pain and leg swelling.  Gastrointestinal:  Negative for abdominal pain, diarrhea and vomiting.  Genitourinary:  Negative for dysuria and flank pain.  Musculoskeletal:  Negative for back pain and neck pain.  Skin:  Negative for rash.  Neurological:  Positive for dizziness. Negative for syncope and speech difficulty.  Hematological:  Does not bruise/bleed easily.  Psychiatric/Behavioral:  Negative for confusion.     Physical Exam Updated Vital Signs BP (!) 174/120 (BP Location: Right Arm)   Pulse 71   Temp 98.2 F (36.8 C) (Oral)   Resp 20   Ht 1.778 m (5\' 10" )   Wt 98.4 kg   SpO2 100%   BMI 31.14 kg/m  Physical Exam Vitals and nursing note reviewed.  Constitutional:      Appearance: Normal appearance. He is well-developed.  HENT:     Head: Atraumatic.     Nose: Nose normal.     Mouth/Throat:     Mouth: Mucous membranes are moist.     Pharynx: Oropharynx is clear.  Eyes:     General: No scleral icterus.    Extraocular  Movements: Extraocular movements intact.     Conjunctiva/sclera: Conjunctivae normal.     Pupils: Pupils are equal, round, and reactive to light.  Neck:     Vascular: No carotid bruit.     Trachea: No tracheal deviation.  Cardiovascular:     Rate and Rhythm: Normal rate and regular rhythm.     Pulses: Normal pulses.     Heart sounds: Normal heart sounds. No murmur heard.    No friction rub. No gallop.  Pulmonary:     Effort: Pulmonary effort is normal. No accessory muscle usage or respiratory distress.     Breath sounds: Normal breath sounds.  Abdominal:     General: Bowel sounds are normal. There is no distension.     Palpations: Abdomen is soft.      Tenderness: There is no abdominal tenderness.  Genitourinary:    Comments: No cva tenderness. Musculoskeletal:        General: No swelling or tenderness.     Cervical back: Normal range of motion and neck supple. No rigidity or tenderness.     Right lower leg: No edema.     Left lower leg: No edema.  Skin:    General: Skin is warm and dry.     Findings: No rash.  Neurological:     Mental Status: He is alert.     Comments: Alert, speech clear. Motor/sens grossly intact bil. Strength 5/5. Sens grossly intact. Steady gait, no ataxia.   Psychiatric:     Comments: Anxious appearing, pt notes hx same.      ED Results / Procedures / Treatments   Labs (all labs ordered are listed, but only abnormal results are displayed) Results for orders placed or performed during the hospital encounter of 78/93/81  Basic metabolic panel  Result Value Ref Range   Sodium 139 135 - 145 mmol/L   Potassium 4.1 3.5 - 5.1 mmol/L   Chloride 103 98 - 111 mmol/L   CO2 29 22 - 32 mmol/L   Glucose, Bld 107 (H) 70 - 99 mg/dL   BUN 10 6 - 20 mg/dL   Creatinine, Ser 1.11 0.61 - 1.24 mg/dL   Calcium 9.6 8.9 - 10.3 mg/dL   GFR, Estimated >60 >60 mL/min   Anion gap 7 5 - 15  CBC  Result Value Ref Range   WBC 7.3 4.0 - 10.5 K/uL   RBC 4.82 4.22 - 5.81 MIL/uL   Hemoglobin 15.8 13.0 - 17.0 g/dL   HCT 46.5 39.0 - 52.0 %   MCV 96.5 80.0 - 100.0 fL   MCH 32.8 26.0 - 34.0 pg   MCHC 34.0 30.0 - 36.0 g/dL   RDW 12.5 11.5 - 15.5 %   Platelets 199 150 - 400 K/uL   nRBC 0.0 0.0 - 0.2 %  CBG monitoring, ED  Result Value Ref Range   Glucose-Capillary 83 70 - 99 mg/dL  Troponin I (High Sensitivity)  Result Value Ref Range   Troponin I (High Sensitivity) 3 <18 ng/L   CT HEAD WO CONTRAST (5MM)  Result Date: 07/12/2022 CLINICAL DATA:  Headaches, dizziness EXAM: CT HEAD WITHOUT CONTRAST TECHNIQUE: Contiguous axial images were obtained from the base of the skull through the vertex without intravenous contrast. RADIATION  DOSE REDUCTION: This exam was performed according to the departmental dose-optimization program which includes automated exposure control, adjustment of the mA and/or kV according to patient size and/or use of iterative reconstruction technique. COMPARISON:  07/03/2022 FINDINGS: Brain: No acute intracranial findings are  seen. There are no signs of bleeding within the cranium. Ventricles are not dilated. There is no focal edema or mass effect. Vascular: Unremarkable. Skull: Unremarkable. Sinuses/Orbits: Unremarkable. Other: No significant interval changes are noted. IMPRESSION: No acute intracranial findings are seen in noncontrast CT brain. Electronically Signed   By: Elmer Picker M.D.   On: 07/12/2022 12:36     EKG EKG Interpretation  Date/Time:  Friday July 12 2022 11:49:15 EST Ventricular Rate:  69 PR Interval:  148 QRS Duration: 76 QT Interval:  376 QTC Calculation: 402 R Axis:   3 Text Interpretation: Normal sinus rhythm Normal ECG When compared with ECG of 05-Jun-2022 12:15, No significant change was found Confirmed by Lajean Saver [phone]) on 07/12/2022 11:55:13 AM  Radiology CT HEAD WO CONTRAST (5MM)  Result Date: 07/12/2022 CLINICAL DATA:  Headaches, dizziness EXAM: CT HEAD WITHOUT CONTRAST TECHNIQUE: Contiguous axial images were obtained from the base of the skull through the vertex without intravenous contrast. RADIATION DOSE REDUCTION: This exam was performed according to the departmental dose-optimization program which includes automated exposure control, adjustment of the mA and/or kV according to patient size and/or use of iterative reconstruction technique. COMPARISON:  07/03/2022 FINDINGS: Brain: No acute intracranial findings are seen. There are no signs of bleeding within the cranium. Ventricles are not dilated. There is no focal edema or mass effect. Vascular: Unremarkable. Skull: Unremarkable. Sinuses/Orbits: Unremarkable. Other: No significant interval changes are  noted. IMPRESSION: No acute intracranial findings are seen in noncontrast CT brain. Electronically Signed   By: Elmer Picker M.D.   On: 07/12/2022 12:36    Procedures Procedures    Medications Ordered in ED Medications  LORazepam (ATIVAN) tablet 1 mg (has no administration in time range)    ED Course/ Medical Decision Making/ A&P                           Medical Decision Making Problems Addressed: Anxiety: acute illness or injury with systemic symptoms Dizziness: acute illness or injury with systemic symptoms Essential hypertension: chronic illness or injury with exacerbation, progression, or side effects of treatment that poses a threat to life or bodily functions Palpitations: acute illness or injury with systemic symptoms  Amount and/or Complexity of Data Reviewed Independent Historian:     Details: Family/so, hx External Data Reviewed: notes. Labs: ordered. Decision-making details documented in ED Course. Radiology: ordered and independent interpretation performed. Decision-making details documented in ED Course. ECG/medicine tests: ordered and independent interpretation performed. Decision-making details documented in ED Course.  Risk Prescription drug management. Decision regarding hospitalization.   Iv ns. Continuous pulse ox and cardiac monitoring. Labs ordered/sent. Imaging ordered.   Differential diagnosis includes anxiety/stress reaction, cva, uncontrolled htn, hypokalemia, anemia, aki, etc . Dispo decision including potential need for admission considered - will get labs and imaging and reassess.   Reviewed nursing notes and prior charts for additional history. External reports reviewed. Additional history from: family.   Cardiac monitor: sinus rhythm, rate 70.  Labs reviewed/interpreted by me - wbc and hgb normal. Glucose normal. BUN/CR normal.   Ativan 1 mg po.   CT reviewed/interpreted by me - no hem.   Recheck bp improved from prior. Po fluids.  Ambulate in hall.   Bp is 160/102. Hr 68. Earlier symptoms resolved.   Pt currently appears stable for d/c.   Rec close pcp f/u.  Return precautions provided.          Final Clinical Impression(s) / ED Diagnoses  Final diagnoses:  None    Rx / DC Orders ED Discharge Orders     None         Cathren Laine, MD 07/12/22 1510

## 2022-07-12 NOTE — ED Triage Notes (Signed)
Pt presents with lip and left arm numbness that started around 25 minutes, had wisdom teeth on January 3rd, also having headache and ringing in his ears.

## 2022-07-12 NOTE — Discharge Instructions (Signed)
It was our pleasure to provide your ER care today - we hope that you feel better.  Drink plenty of fluids/stay well hydrated.   Your blood pressure is high today - continue meds, limit salt intake, and follow up closely with your doctor in 1-2 weeks.   For dizziness, follow up closely with ENT doctor as previously referred.  Return to ER if worse, new symptoms, fevers, chest pain, increased trouble breathing, change in speech/vision, one-side of body numbness/weakness, persistent fast heart beating, or other concern.

## 2022-07-13 LAB — CBC WITH DIFFERENTIAL/PLATELET
Basophils Absolute: 0 10*3/uL (ref 0.0–0.2)
Basos: 1 %
EOS (ABSOLUTE): 0.1 10*3/uL (ref 0.0–0.4)
Eos: 1 %
Hematocrit: 47.6 % (ref 37.5–51.0)
Hemoglobin: 16.8 g/dL (ref 13.0–17.7)
Immature Grans (Abs): 0 10*3/uL (ref 0.0–0.1)
Immature Granulocytes: 0 %
Lymphocytes Absolute: 1.7 10*3/uL (ref 0.7–3.1)
Lymphs: 26 %
MCH: 33.5 pg — ABNORMAL HIGH (ref 26.6–33.0)
MCHC: 35.3 g/dL (ref 31.5–35.7)
MCV: 95 fL (ref 79–97)
Monocytes Absolute: 0.7 10*3/uL (ref 0.1–0.9)
Monocytes: 11 %
Neutrophils Absolute: 4 10*3/uL (ref 1.4–7.0)
Neutrophils: 61 %
Platelets: 215 10*3/uL (ref 150–450)
RBC: 5.02 x10E6/uL (ref 4.14–5.80)
RDW: 12.4 % (ref 11.6–15.4)
WBC: 6.5 10*3/uL (ref 3.4–10.8)

## 2022-07-13 LAB — CMP14+EGFR
ALT: 23 IU/L (ref 0–44)
AST: 20 IU/L (ref 0–40)
Albumin/Globulin Ratio: 2.2 (ref 1.2–2.2)
Albumin: 5.3 g/dL — ABNORMAL HIGH (ref 4.1–5.1)
Alkaline Phosphatase: 60 IU/L (ref 44–121)
BUN/Creatinine Ratio: 7 — ABNORMAL LOW (ref 9–20)
BUN: 8 mg/dL (ref 6–24)
Bilirubin Total: 0.5 mg/dL (ref 0.0–1.2)
CO2: 26 mmol/L (ref 20–29)
Calcium: 10.2 mg/dL (ref 8.7–10.2)
Chloride: 99 mmol/L (ref 96–106)
Creatinine, Ser: 1.09 mg/dL (ref 0.76–1.27)
Globulin, Total: 2.4 g/dL (ref 1.5–4.5)
Glucose: 95 mg/dL (ref 70–99)
Potassium: 4.6 mmol/L (ref 3.5–5.2)
Sodium: 139 mmol/L (ref 134–144)
Total Protein: 7.7 g/dL (ref 6.0–8.5)
eGFR: 88 mL/min/{1.73_m2} (ref >59)

## 2022-07-13 LAB — HIV ANTIBODY (ROUTINE TESTING W REFLEX): HIV Screen 4th Generation wRfx: NONREACTIVE

## 2022-07-13 LAB — MICROALBUMIN / CREATININE URINE RATIO
Creatinine, Urine: 276.4 mg/dL
Microalb/Creat Ratio: 97 mg/g creat — ABNORMAL HIGH (ref 0–29)
Microalbumin, Urine: 269 ug/mL

## 2022-07-13 LAB — TSH+FREE T4
Free T4: 1.42 ng/dL (ref 0.82–1.77)
TSH: 1.17 u[IU]/mL (ref 0.450–4.500)

## 2022-07-13 LAB — LIPID PANEL
Chol/HDL Ratio: 2.7 ratio (ref 0.0–5.0)
Cholesterol, Total: 141 mg/dL (ref 100–199)
HDL: 53 mg/dL (ref >39)
LDL Chol Calc (NIH): 71 mg/dL (ref 0–99)
Triglycerides: 93 mg/dL (ref 0–149)
VLDL Cholesterol Cal: 17 mg/dL (ref 5–40)

## 2022-07-13 LAB — HEMOGLOBIN A1C
Est. average glucose Bld gHb Est-mCnc: 126 mg/dL
Hgb A1c MFr Bld: 6 % — ABNORMAL HIGH (ref 4.8–5.6)

## 2022-07-13 LAB — VITAMIN D 25 HYDROXY (VIT D DEFICIENCY, FRACTURES): Vit D, 25-Hydroxy: 18.1 ng/mL — ABNORMAL LOW (ref 30.0–100.0)

## 2022-07-13 LAB — HEPATITIS C ANTIBODY: Hep C Virus Ab: NONREACTIVE

## 2022-07-15 ENCOUNTER — Telehealth: Payer: Medicaid Other | Admitting: Emergency Medicine

## 2022-07-15 ENCOUNTER — Encounter: Payer: Self-pay | Admitting: Nurse Practitioner

## 2022-07-15 DIAGNOSIS — R519 Headache, unspecified: Secondary | ICD-10-CM

## 2022-07-15 MED ORDER — CYCLOBENZAPRINE HCL 10 MG PO TABS: 10.0000 mg | ORAL_TABLET | Freq: Three times a day (TID) | ORAL | 0 refills | Status: DC | PRN

## 2022-07-15 NOTE — Patient Instructions (Signed)
[facility] [place], [place]    [name], thank you for joining Montine Circle, PA-C for today's virtual visit.  While this provider is not your primary care provider (PCP), if your PCP is located in our provider database this encounter information will be shared with them immediately following your visit.   [facility] account gives you access to today's visit and all your visits, tests, and labs performed at [facility] " click here if you don't have a [facility] account or go to [email or web address]  Consent: (Patient) Gerald Phillips provided verbal consent for this virtual visit at the beginning of the encounter.  Current Medications:  Current Outpatient Medications:    cyclobenzaprine (FLEXERIL) 10 MG tablet, Take 1 tablet (10 mg total) by mouth 3 (three) times daily as needed for muscle spasms., Disp: 10 tablet, Rfl: 0   acetaminophen (TYLENOL) 500 MG tablet, Take 2 tablets (1,000 mg total) by mouth every 6 (six) hours as needed for mild pain or fever., Disp: , Rfl:    hydrOXYzine (ATARAX) 10 MG tablet, Take 1 tablet (10 mg total) by mouth in the morning and at bedtime. (Patient not taking: Reported on 07/12/2022), Disp: 18 tablet, Rfl: 0   LORazepam (ATIVAN) 1 MG tablet, Take 1 tablet (1 mg total) by mouth every 6 (six) hours as needed for anxiety. (Patient not taking: Reported on 07/12/2022), Disp: 10 tablet, Rfl: 0   telmisartan (MICARDIS) 40 MG tablet, Take 1 tablet (40 mg total) by mouth daily. (Patient not taking: Reported on 07/12/2022), Disp: 30 tablet, Rfl: 1   Medications ordered in this encounter:  Meds ordered this encounter  Medications   cyclobenzaprine (FLEXERIL) 10 MG tablet    Sig: Take 1 tablet (10 mg total) by mouth 3 (three) times daily as needed for muscle spasms.    Dispense:  10 tablet    Refill:  0    Order Specific Question:   Supervising Provider    Answer:   Chase Picket [id number]     *If you need refills on other medications prior to your next appointment, please contact your pharmacy*  Follow-Up: Call back or seek an in-person evaluation if the symptoms worsen or if the condition fails to improve as anticipated.  [place] [phone]  Other Instructions    If you have been instructed to have an in-person evaluation today at a local Urgent Care facility, please use the link below. It will take you to a list of all of our available [facility], including address, phone number and hours of operation. Please do not delay care.  [facility]  If you or a family member do not have a primary care provider, use the link below to schedule a visit and establish care. When you choose a [facility] primary care physician or advanced practice provider, you gain a long-term partner in health. Find a Primary Care Provider  Learn more about [facility]'s in-office and virtual care options: [facility]

## 2022-07-15 NOTE — Progress Notes (Signed)
Gerald Phillips,  Thank you for submitting an e-visit. It sounds like your symptoms are outside of the spectrum of things we treat over e-visit. We would be happy to evaluate you over a video visit. Please go back to the Mychart Virtual options and select Urgent Care/Virtual Video Visit.   Thank you!    Thank you for the details you included in the comment boxes. Those details are very helpful in determining the best course of treatment for you and help [place] to provide the best care.  If you convert to a video visit, we will bill your insurance (similar to an office visit) and you will not be charged for this e-Visit. You will be able to stay at home and speak with the first available [facility] advanced practice provider. The link to do a video visit is in the drop down Menu tab of your Welcome screen in [place].
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
## 2022-07-22 ENCOUNTER — Ambulatory Visit (HOSPITAL_COMMUNITY)
Admission: RE | Admit: 2022-07-22 | Discharge: 2022-07-22 | Disposition: A | Discharge status: Home / Self Care | Payer: Medicaid Other | Source: Ambulatory Visit | Attending: Internal Medicine | Admitting: Internal Medicine

## 2022-07-22 ENCOUNTER — Telehealth: Payer: Self-pay

## 2022-07-22 ENCOUNTER — Encounter: Payer: Self-pay | Admitting: Family Medicine

## 2022-07-22 DIAGNOSIS — I10 Essential (primary) hypertension: Secondary | ICD-10-CM | POA: Diagnosis not present

## 2022-07-22 LAB — ECHOCARDIOGRAM COMPLETE
Area-P 1/2: 3.31 cm2
S' Lateral: 3.4 cm

## 2022-07-22 NOTE — Telephone Encounter (Signed)
-----  Message from Chalmers Guest, MD sent at 07/22/2022  3:11 PM EST ----- Normal Echo.

## 2022-07-22 NOTE — Progress Notes (Signed)
*  PRELIMINARY RESULTS* Echocardiogram 2D Echocardiogram has been performed.  Gerald Phillips 07/22/2022, 2:03 PM

## 2022-07-22 NOTE — Telephone Encounter (Signed)
Patient notified and verbalized understanding. Patient had no questions or concerns at this time.  

## 2022-07-25 ENCOUNTER — Other Ambulatory Visit: Payer: Self-pay

## 2022-07-25 ENCOUNTER — Emergency Department (HOSPITAL_COMMUNITY): Payer: Self-pay

## 2022-07-25 ENCOUNTER — Encounter (HOSPITAL_COMMUNITY): Payer: Self-pay | Admitting: Emergency Medicine

## 2022-07-25 ENCOUNTER — Emergency Department (HOSPITAL_COMMUNITY)
Admission: EM | Admit: 2022-07-25 | Discharge: 2022-07-25 | Disposition: A | Discharge status: Home / Self Care | Payer: Self-pay | Attending: Emergency Medicine | Admitting: Emergency Medicine

## 2022-07-25 DIAGNOSIS — Z79899 Other long term (current) drug therapy: Secondary | ICD-10-CM | POA: Insufficient documentation

## 2022-07-25 DIAGNOSIS — R072 Precordial pain: Secondary | ICD-10-CM | POA: Insufficient documentation

## 2022-07-25 DIAGNOSIS — R519 Headache, unspecified: Secondary | ICD-10-CM | POA: Insufficient documentation

## 2022-07-25 DIAGNOSIS — I10 Essential (primary) hypertension: Secondary | ICD-10-CM | POA: Insufficient documentation

## 2022-07-25 LAB — BASIC METABOLIC PANEL
Anion gap: 7 (ref 5–15)
BUN: 9 mg/dL (ref 6–20)
CO2: 26 mmol/L (ref 22–32)
Calcium: 9.4 mg/dL (ref 8.9–10.3)
Chloride: 103 mmol/L (ref 98–111)
Creatinine, Ser: 1.05 mg/dL (ref 0.61–1.24)
GFR, Estimated: >60 mL/min (ref >60)
Glucose, Bld: 93 mg/dL (ref 70–99)
Potassium: 3.7 mmol/L (ref 3.5–5.1)
Sodium: 136 mmol/L (ref 135–145)

## 2022-07-25 LAB — TROPONIN I (HIGH SENSITIVITY)
Troponin I (High Sensitivity): 4 ng/L (ref <18)
Troponin I (High Sensitivity): 4 ng/L (ref <18)

## 2022-07-25 LAB — CBC
HCT: 46.4 % (ref 39.0–52.0)
Hemoglobin: 15.8 g/dL (ref 13.0–17.0)
MCH: 32.8 pg (ref 26.0–34.0)
MCHC: 34.1 g/dL (ref 30.0–36.0)
MCV: 96.5 fL (ref 80.0–100.0)
Platelets: 196 10*3/uL (ref 150–400)
RBC: 4.81 MIL/uL (ref 4.22–5.81)
RDW: 12.2 % (ref 11.5–15.5)
WBC: 5 10*3/uL (ref 4.0–10.5)
nRBC: 0 % (ref 0.0–0.2)

## 2022-07-25 MED ORDER — CHLORTHALIDONE 25 MG PO TABS: 25.0000 mg | ORAL_TABLET | Freq: Every day | ORAL | 2 refills | Status: DC

## 2022-07-25 MED ORDER — METOCLOPRAMIDE HCL 10 MG PO TABS
10.0000 mg | ORAL_TABLET | Freq: Once | ORAL | Status: AC
  Administered 2022-07-25: 10 mg via ORAL
  Filled 2022-07-25: qty 1

## 2022-07-25 MED ORDER — CHLORTHALIDONE 25 MG PO TABS
12.5000 mg | ORAL_TABLET | Freq: Once | ORAL | Status: AC
  Administered 2022-07-25: 12.5 mg via ORAL
  Filled 2022-07-25: qty 1

## 2022-07-25 NOTE — Discharge Instructions (Signed)
Please read and follow all provided instructions.  Your diagnoses today include:  1. Primary hypertension   2. Acute nonintractable headache, unspecified headache type   3. Precordial pain     Tests performed today include: An EKG of your heart A chest x-ray Cardiac enzymes - a blood test for heart muscle damage Blood counts and electrolytes Vital signs. See below for your results today.   Medications prescribed:  Chlorthalidone: blood pressure medication, take in morning  Take any prescribed medications only as directed.  Follow-up instructions: Please follow-up with your primary care provider in 1 week for further evaluation of your symptoms.   Return instructions:  SEEK IMMEDIATE MEDICAL ATTENTION IF: You have severe chest pain, especially if the pain is crushing or pressure-like and spreads to the arms, back, neck, or jaw, or if you have sweating, nausea or vomiting, or trouble with breathing. THIS IS AN EMERGENCY. Do not wait to see if the pain will go away. Get medical help at once. Call 911. DO NOT drive yourself to the hospital.  Your chest pain gets worse and does not go away after a few minutes of rest.  You have an attack of chest pain lasting longer than what you usually experience.  You have significant dizziness, if you pass out, or have trouble walking.  You have chest pain not typical of your usual pain for which you originally saw your caregiver.  You have any other emergent concerns regarding your health.  Additional Information: Chest pain comes from many different causes. Your caregiver has diagnosed you as having chest pain that is not specific for one problem, but does not require admission.  You are at low risk for an acute heart condition or other serious illness.   Your vital signs today were: BP (!) 163/110   Pulse (!) 53   Temp 98.9 F (37.2 C) (Oral)   Resp 15   Ht 5\' 10"  (1.778 m)   Wt 99 kg   SpO2 98%   BMI 31.32 kg/m  If your blood pressure  (BP) was elevated above 135/85 this visit, please have this repeated by your doctor within one month. --------------

## 2022-07-25 NOTE — ED Notes (Signed)
Pt ambulatory to room. Pt reports dizziness when walking.

## 2022-07-25 NOTE — ED Triage Notes (Addendum)
Patient c/o hypertension, chest pain, and headache.  Patient spoke to his PCP and was told to come to ED.

## 2022-07-25 NOTE — ED Provider Notes (Signed)
[facility] EMERGENCY DEPARTMENT Provider Note   CSN: [id number] Arrival date & time: 07/25/22  1757     History  Chief Complaint  Patient presents with   Hypertension    Gerald Phillips is a 42 y.o. male.  Patient with history of hypertension, recently on amlodipine but recently changed to telmisartan --presents to the emergency department with elevated blood pressure and chest pain as well as intermittent headaches.  Patient has had intermittent sharp pains that are worse with deep breathing and palpation of the left shoulder area.  He also has headaches that shoot from his neck area up to the side of his head.  Patient states that his symptoms have been ongoing for several months.  He has seen cardiology and had recent echocardiogram.  He has an upcoming sleep study.  He is very anxious about his symptoms reporting having "panic attacks".  He has tried Flexeril for symptoms but does not really like taking medication.  He has cut out tobacco and alcohol and has been eating a lot more vegetables and salads.  Despite this, blood pressures remain high and this makes him very anxious.  States that he talked with his cardiologist today and was sent in due to very high blood pressures and associated chest pain.  He has a family history of MI and a grandparent who died at age 41.       Home Medications Prior to Admission medications   Medication Sig Start Date End Date Taking? Authorizing Provider  acetaminophen (TYLENOL) 500 MG tablet Take 2 tablets (1,000 mg total) by mouth every 6 (six) hours as needed for mild pain or fever. 08/31/19   Juliet Rude, PA-C  cyclobenzaprine (FLEXERIL) 10 MG tablet Take 1 tablet (10 mg total) by mouth 3 (three) times daily as needed for muscle spasms. 07/15/22   Roxy Horseman, PA-C  hydrOXYzine (ATARAX) 10 MG tablet Take 1 tablet (10 mg total) by mouth in the morning and at bedtime. Patient not taking: Reported on 07/12/2022 06/14/22   Mallipeddi, Vishnu P,  MD  LORazepam (ATIVAN) 1 MG tablet Take 1 tablet (1 mg total) by mouth every 6 (six) hours as needed for anxiety. Patient not taking: Reported on 07/12/2022 05/24/22   Geoffery Lyons, MD  telmisartan (MICARDIS) 40 MG tablet Take 1 tablet (40 mg total) by mouth daily. Patient not taking: Reported on 07/12/2022 07/11/22   Del Nigel Berthold, FNP      Allergies    Penicillins and Penicillins    Review of Systems   Review of Systems  Physical Exam Updated Vital Signs BP (!) 188/115   Pulse 66   Temp 98.9 F (37.2 C) (Oral)   Resp 18   Ht 5\' 10"  (1.778 m)   Wt 99 kg   SpO2 99%   BMI 31.32 kg/m  Physical Exam Vitals and nursing note reviewed.  Constitutional:      Appearance: He is well-developed. He is not diaphoretic.  HENT:     Head: Normocephalic and atraumatic.     Right Ear: Tympanic membrane, ear canal and external ear normal.     Left Ear: Tympanic membrane, ear canal and external ear normal.     Nose: Nose normal.     Mouth/Throat:     Mouth: Mucous membranes are moist. Mucous membranes are not dry.     Pharynx: Uvula midline.  Eyes:     General: Lids are normal.     Conjunctiva/sclera: Conjunctivae normal.  Pupils: Pupils are equal, round, and reactive to light.  Neck:     Vascular: Normal carotid pulses. No carotid bruit or JVD.     Trachea: Trachea normal. No tracheal deviation.  Cardiovascular:     Rate and Rhythm: Normal rate and regular rhythm.     Pulses: No decreased pulses.          Radial pulses are 2+ on the right side and 2+ on the left side.     Heart sounds: Normal heart sounds, S1 normal and S2 normal. Heart sounds not distant. No murmur heard. Pulmonary:     Effort: Pulmonary effort is normal. No respiratory distress.     Breath sounds: Normal breath sounds. No wheezing.  Chest:     Chest wall: No tenderness.  Abdominal:     General: Bowel sounds are normal.     Palpations: Abdomen is soft.     Tenderness: There is no abdominal tenderness.  There is no guarding or rebound.  Musculoskeletal:        General: Normal range of motion.     Cervical back: Normal range of motion and neck supple. No tenderness or bony tenderness. No muscular tenderness.     Right lower leg: No edema.     Left lower leg: No edema.  Skin:    General: Skin is warm and dry.     Coloration: Skin is not pale.  Neurological:     Mental Status: He is alert and oriented to person, place, and time. Mental status is at baseline.     GCS: GCS eye subscore is 4. GCS verbal subscore is 5. GCS motor subscore is 6.     Cranial Nerves: No cranial nerve deficit.     Sensory: No sensory deficit.     Motor: No abnormal muscle tone.     Coordination: Coordination normal.  Psychiatric:        Mood and Affect: Mood is anxious.     ED Results / Procedures / Treatments   Labs (all labs ordered are listed, but only abnormal results are displayed) Labs Reviewed  BASIC METABOLIC PANEL  CBC  TROPONIN I (HIGH SENSITIVITY)  TROPONIN I (HIGH SENSITIVITY)    ED ECG REPORT   Date: 07/25/2022  Rate: 69  Rhythm: normal sinus rhythm  QRS Axis: normal  Intervals: normal  ST/T Wave abnormalities: nonspecific T wave changes  Conduction Disutrbances:none  Narrative Interpretation:   Old EKG Reviewed: unchanged  I have personally reviewed the EKG tracing and agree with the computerized printout as noted.   Radiology DG Chest 2 View  Result Date: 07/25/2022 CLINICAL DATA:  Chest pain EXAM: CHEST - 2 VIEW COMPARISON:  Chest x-ray June 05, 2022 FINDINGS: The cardiomediastinal silhouette is unchanged in contour. Low lung volumes with bronchovascular crowding. No focal pulmonary opacity. No pleural effusion or pneumothorax. The visualized upper abdomen is unremarkable. No acute osseous abnormality. IMPRESSION: No acute cardiopulmonary abnormality. Electronically Signed   By: Beryle Flock M.D.   On: 07/25/2022 18:49    Procedures Procedures    Medications Ordered  in ED Medications  metoCLOPramide (REGLAN) tablet 10 mg (has no administration in time range)  chlorthalidone (HYGROTON) tablet 12.5 mg (12.5 mg Oral Given 07/25/22 1943)    ED Course/ Medical Decision Making/ A&P    Patient seen and examined. History obtained directly from patient. Work-up including labs, imaging, EKG ordered in triage, if performed, were reviewed.    Labs/EKG: Independently reviewed and interpreted.  This included:  EKG with inverted T waves in leads III and aVF, otherwise no concerning findings.  Cardiac workup pending.  Imaging: X-ray, agree negative.  Medications/Fluids: Ordered: P.o. chlorthalidone as patient will likely be initiated on this medication in addition to ARB.  Most recent vital signs reviewed and are as follows: BP (!) 188/115   Pulse 66   Temp 98.9 F (37.2 C) (Oral)   Resp 18   Ht 5\' 10"  (1.778 m)   Wt 99 kg   SpO2 99%   BMI 31.32 kg/m   Initial impression: Atypical chest pain, headaches, anxiety.  10:02 PM Reassessment performed. Patient appears stable.  Continues to have a headache.  P.o. Compazine ordered as patient does not want a shot.  Considered Toradol.  Labs personally reviewed and interpreted including: CBC unremarkable, BMP unremarkable, troponin normal, flat trend 4 >> 4.   Reviewed pertinent lab work and imaging with patient at bedside. Questions answered.   Most current vital signs reviewed and are as follows: BP (!) 163/110   Pulse (!) 53   Temp 98.9 F (37.2 C) (Oral)   Resp 15   Ht 5\' 10"  (1.778 m)   Wt 99 kg   SpO2 98%   BMI 31.32 kg/m   Plan: Discharge to home.   Prescriptions written for: Chlorthalidone  Return and follow-up instructions: I encouraged patient to return to ED with severe chest pain, especially if the pain is crushing or pressure-like and spreads to the arms, back, neck, or jaw, or if they have associated sweating, vomiting, or shortness of breath with the pain, or significant pain with  activity. We discussed that the evaluation here today indicates a low-risk of serious cause of chest pain, including heart trouble or a blood clot, but no evaluation is perfect and chest pain can evolve with time. The patient verbalized understanding and agreed.  I encouraged patient to follow-up with their provider in the next 7 days for recheck.                               Medical Decision Making Amount and/or Complexity of Data Reviewed Labs: ordered. Radiology: ordered.  Risk Prescription drug management.   For this patient's complaint of chest pain, the following emergent conditions were considered on the differential diagnosis: acute coronary syndrome, pulmonary embolism, pneumothorax, myocarditis, pericardial tamponade, aortic dissection, thoracic aortic aneurysm complication, esophageal perforation.   Other causes were also considered including: gastroesophageal reflux disease, musculoskeletal pain including costochondritis, pneumonia/pleurisy, herpes zoster, pericarditis.  In regards to possibility of ACS, patient has atypical features of pain, non-ischemic and unchanged EKG and negative troponin(s). Heart score was calculated to be 2.   In regards to possibility of PE, symptoms are atypical for PE and risk profile is low.   In regards to the patient's headache, critical differentials were considered including subarachnoid hemorrhage, intracerebral hemorrhage, epidural/subdural hematoma, pituitary apoplexy, vertebral/carotid artery dissection, giant cell arteritis, central venous thrombosis, reversible cerebral vasoconstriction, acute angle closure glaucoma, idiopathic intracranial hypertension, bacterial meningitis, viral encephalitis, carbon monoxide poisoning, posterior reversible encephalopathy syndrome, pre-eclampsia.   Reg flag symptoms related to these causes were considered including systemic symptoms (fever, weight loss), neurologic symptoms (confusion, mental status  change, vision change, associated seizure), acute or sudden "thunderclap" onset, patient age 29 or older with new or progressive headache, patient of any age with first headache or change in headache pattern, pregnant or postpartum status, history of HIV or other immunocompromise, history  of cancer, headache occurring with exertion, associated neck or shoulder pain, associated traumatic injury, concurrent use of anticoagulation, family history of spontaneous SAH, and concurrent drug use.    Other benign, more common causes of headache were considered including migraine, tension-type headache, cluster headache, referred pain from other cause such as sinus infection, dental pain, trigeminal neuralgia.   On exam, patient has a reassuring neuro exam including baseline mental status, no significant neck pain or meningeal signs, no signs of severe infection or fever.   The patient's vital signs, pertinent lab work and imaging were reviewed and interpreted as discussed in the ED course. Hospitalization was considered for further testing, treatments, or serial exams/observation. However as patient is well-appearing, has a stable exam over the course of their evaluation, and reassuring studies today, I do not feel that they warrant admission at this time. This plan was discussed with the patient who verbalizes agreement and comfort with this plan and seems reliable and able to return to the Emergency Department with worsening or changing symptoms.            Final Clinical Impression(s) / ED Diagnoses Final diagnoses:  Primary hypertension  Acute nonintractable headache, unspecified headache type  Precordial pain    Rx / DC Orders ED Discharge Orders          Ordered    chlorthalidone (HYGROTON) 25 MG tablet  Daily        07/25/22 2201              Carlisle Cater, PA-C 07/25/22 2204    Cristie Hem, MD 07/26/22 [phone]

## 2022-07-26 ENCOUNTER — Telehealth: Payer: Self-pay

## 2022-07-26 ENCOUNTER — Other Ambulatory Visit: Payer: Self-pay

## 2022-07-26 NOTE — Telephone Encounter (Signed)
Hello Gerald Phillips I spoke to patient

## 2022-07-26 NOTE — Telephone Encounter (Signed)
pt called requesting to speak to [place] regarding bp, said he was told to come in today/call today, he just wanted clarification, I dont see anything in the chart saying for him to come in, please advice?

## 2022-07-31 ENCOUNTER — Emergency Department (HOSPITAL_COMMUNITY)
Admission: EM | Admit: 2022-07-31 | Discharge: 2022-07-31 | Disposition: A | Discharge status: Home / Self Care | Payer: Medicaid Other | Attending: Emergency Medicine | Admitting: Emergency Medicine

## 2022-07-31 ENCOUNTER — Emergency Department (HOSPITAL_COMMUNITY): Payer: Medicaid Other

## 2022-07-31 ENCOUNTER — Encounter (HOSPITAL_COMMUNITY): Payer: Self-pay | Admitting: *Deleted

## 2022-07-31 ENCOUNTER — Other Ambulatory Visit: Payer: Self-pay

## 2022-07-31 DIAGNOSIS — R42 Dizziness and giddiness: Secondary | ICD-10-CM | POA: Diagnosis not present

## 2022-07-31 DIAGNOSIS — F419 Anxiety disorder, unspecified: Secondary | ICD-10-CM | POA: Insufficient documentation

## 2022-07-31 DIAGNOSIS — I10 Essential (primary) hypertension: Secondary | ICD-10-CM | POA: Insufficient documentation

## 2022-07-31 DIAGNOSIS — R079 Chest pain, unspecified: Secondary | ICD-10-CM | POA: Diagnosis present

## 2022-07-31 DIAGNOSIS — Z79899 Other long term (current) drug therapy: Secondary | ICD-10-CM | POA: Insufficient documentation

## 2022-07-31 DIAGNOSIS — R519 Headache, unspecified: Secondary | ICD-10-CM | POA: Diagnosis not present

## 2022-07-31 LAB — CBC
HCT: 46.6 % (ref 39.0–52.0)
Hemoglobin: 16.2 g/dL (ref 13.0–17.0)
MCH: 33.1 pg (ref 26.0–34.0)
MCHC: 34.8 g/dL (ref 30.0–36.0)
MCV: 95.1 fL (ref 80.0–100.0)
Platelets: 159 10*3/uL (ref 150–400)
RBC: 4.9 MIL/uL (ref 4.22–5.81)
RDW: 12.1 % (ref 11.5–15.5)
WBC: 5.8 10*3/uL (ref 4.0–10.5)
nRBC: 0 % (ref 0.0–0.2)

## 2022-07-31 LAB — BASIC METABOLIC PANEL
Anion gap: 11 (ref 5–15)
BUN: 13 mg/dL (ref 6–20)
CO2: 30 mmol/L (ref 22–32)
Calcium: 9.7 mg/dL (ref 8.9–10.3)
Chloride: 97 mmol/L — ABNORMAL LOW (ref 98–111)
Creatinine, Ser: 1.11 mg/dL (ref 0.61–1.24)
GFR, Estimated: >60 mL/min (ref >60)
Glucose, Bld: 92 mg/dL (ref 70–99)
Potassium: 3.6 mmol/L (ref 3.5–5.1)
Sodium: 138 mmol/L (ref 135–145)

## 2022-07-31 LAB — TROPONIN I (HIGH SENSITIVITY)
Troponin I (High Sensitivity): 4 ng/L (ref <18)
Troponin I (High Sensitivity): 5 ng/L (ref <18)

## 2022-07-31 LAB — D-DIMER, QUANTITATIVE: D-Dimer, Quant: 0.88 ug/mL-FEU — ABNORMAL HIGH (ref 0.00–0.50)

## 2022-07-31 MED ORDER — LORAZEPAM 1 MG PO TABS
1.0000 mg | ORAL_TABLET | Freq: Once | ORAL | Status: AC
  Administered 2022-07-31: 1 mg via ORAL
  Filled 2022-07-31: qty 1

## 2022-07-31 MED ORDER — LORAZEPAM 1 MG PO TABS: 1.0000 mg | ORAL_TABLET | Freq: Three times a day (TID) | ORAL | 0 refills | Status: DC | PRN

## 2022-07-31 MED ORDER — IOHEXOL 350 MG/ML SOLN
80.0000 mL | Freq: Once | INTRAVENOUS | Status: AC | PRN
  Administered 2022-07-31: 80 mL via INTRAVENOUS

## 2022-07-31 NOTE — ED Triage Notes (Signed)
Pt here for CP, he has been having CP and htn for a while and would like to be evaluated.  Pt also notes stress

## 2022-07-31 NOTE — Discharge Instructions (Signed)
Please follow-up with your primary care provider.  Return to the emergency department for any new or worsening symptoms.  Continue to take your blood pressure medication daily as directed and keep a diary of your blood pressure readings at home and take this with you when you see your primary care provider

## 2022-08-02 ENCOUNTER — Telehealth: Payer: Self-pay

## 2022-08-02 ENCOUNTER — Other Ambulatory Visit: Payer: Self-pay | Admitting: Family Medicine

## 2022-08-02 DIAGNOSIS — I10 Essential (primary) hypertension: Secondary | ICD-10-CM

## 2022-08-02 NOTE — Telephone Encounter (Signed)
Called Gerald Phillips as follow up after ER Visits recently. He had attempted to enroll in [employer] November 2022 but at that time had [employer]. He states he cancelled that insurance. He will have [employer] starting. He has established care with Dr Audie Pinto and states he has his next visit on 08/08/22. He states he does have anxiety and feels his blood pressure and has chest tightness due to anxiety. He also has an upcoming appointment to establish for [facility] upcoming in February also.  He is taking his blood pressure medication as well as medication for anxiety. Encouraged client to also always reach out to his new primary care for any concerns medically and recommendations for his medical care.  Client is agreeable for a check in call again in about 1 week after establishing medical care.  [name]

## 2022-08-03 NOTE — ED Provider Notes (Signed)
[facility] Provider Note   CSN: [id number] Arrival date & time: 07/31/22  1700     History  Chief Complaint  Patient presents with   Chest Pain    Gerald Phillips is a 42 y.o. male.   Chest Pain Associated symptoms: dizziness and headache   Associated symptoms: no back pain, no fever, no nausea, no numbness, no shortness of breath, no vomiting and no weakness        Gerald Phillips is a 42 y.o. male with hx of HTN, anxiety who presents to the Emergency Department complaining of chest pain and elevated blood pressure.  States he has been experiencing intermittent chest pain for some time. And occasionally checks his blood pressure and it has been elevated for for weeks.  He was recently seen by his PCP and started on a new blood pressure medication less than one week ago.  He is concerned the medication is not working properly.  He also notes increased stress lately and believes he may have PTSD.  He is waiting on appt with psychiatry.  He denies cough, shortness of breath   Home Medications Prior to Admission medications   Medication Sig Start Date End Date Taking? Authorizing Provider  chlorthalidone (HYGROTON) 25 MG tablet Take 1 tablet (25 mg total) by mouth daily. 07/25/22  Yes Carlisle Cater, PA-C  COLLAGEN PO Take by mouth.   Yes [provider]  LORazepam (ATIVAN) 1 MG tablet Take 1 tablet (1 mg total) by mouth every 8 (eight) hours as needed for anxiety. 07/31/22  Yes Rony Ratz, PA-C  OVER THE COUNTER MEDICATION Vitamin D3 with K2   Yes [provider]  OVER THE COUNTER MEDICATION Take 1 tablet by mouth daily. Vitamin b6,folic acid,vitamin d and vitamin b12   Yes [provider]  OVER THE COUNTER MEDICATION Take 1 tablet by mouth daily. Scription BP   Yes [provider]  telmisartan (MICARDIS) 40 MG tablet TAKE 1 TABLET BY MOUTH EVERY DAY 08/02/22   Del Eli Hose, FNP       Allergies    Penicillins and Penicillins    Review of Systems   Review of Systems  Constitutional:  Negative for appetite change and fever.  Eyes:  Negative for visual disturbance.  Respiratory:  Negative for shortness of breath.   Cardiovascular:  Positive for chest pain.  Gastrointestinal:  Negative for nausea and vomiting.  Musculoskeletal:  Negative for arthralgias, back pain and neck pain.  Neurological:  Positive for dizziness and headaches. Negative for tremors, syncope, weakness, light-headedness and numbness.    Physical Exam Updated Vital Signs BP (!) 152/91   Pulse 71   Temp 98.8 F (37.1 C)   Resp 16   Ht 5\' 10"  (1.778 m)   Wt 97.5 kg   SpO2 99%   BMI 30.85 kg/m  Physical Exam Vitals and nursing note reviewed.  Constitutional:      General: He is not in acute distress.    Appearance: He is well-developed. He is not ill-appearing or toxic-appearing.     Comments: Pt is very anxious appearing  HENT:     Head: Atraumatic.     Mouth/Throat:     Mouth: Mucous membranes are moist.  Eyes:     Extraocular Movements: Extraocular movements intact.     Conjunctiva/sclera: Conjunctivae normal.     Pupils: Pupils are equal, round, and reactive to light.  Cardiovascular:     Rate  and Rhythm: Normal rate and regular rhythm.     Pulses: Normal pulses.  Pulmonary:     Effort: No respiratory distress.     Breath sounds: Normal breath sounds.  Chest:     Chest wall: No tenderness.  Abdominal:     Palpations: Abdomen is soft.     Tenderness: There is no abdominal tenderness.  Musculoskeletal:        General: Normal range of motion.     Cervical back: Normal range of motion. No tenderness.     Right lower leg: No edema.     Left lower leg: No edema.  Skin:    General: Skin is warm.     Capillary Refill: Capillary refill takes less than 2 seconds.  Neurological:     General: No focal deficit present.     Mental Status: He is alert.     Sensory: No sensory  deficit.     Motor: No weakness.  Psychiatric:        Mood and Affect: Mood is anxious.        Behavior: Behavior normal. Behavior is cooperative.        Thought Content: Thought content is not paranoid.     ED Results / Procedures / Treatments   Labs (all labs ordered are listed, but only abnormal results are displayed) Labs Reviewed  BASIC METABOLIC PANEL - Abnormal; Notable for the following components:      Result Value   Chloride 97 (*)    All other components within normal limits  D-DIMER, QUANTITATIVE - Abnormal; Notable for the following components:   D-Dimer, Quant 0.88 (*)    All other components within normal limits  CBC  TROPONIN I (HIGH SENSITIVITY)  TROPONIN I (HIGH SENSITIVITY)    EKG EKG Interpretation  Date/Time:  Wednesday July 31 2022 17:58:43 EST Ventricular Rate:  68 PR Interval:  155 QRS Duration: 82 QT Interval:  368 QTC Calculation: 392 R Axis:   18 Text Interpretation: Sinus rhythm Low voltage, precordial leads Borderline repolarization abnormality Confirmed by Gloris Manchester ([id number]) on 07/31/2022 8:07:56 PM  Radiology No results found.  Procedures Procedures    Medications Ordered in ED Medications  LORazepam (ATIVAN) tablet 1 mg (1 mg Oral Given 07/31/22 1822)  iohexol (OMNIPAQUE) 350 MG/ML injection 80 mL (80 mLs Intravenous Contrast Given 07/31/22 2055)    ED Course/ Medical Decision Making/ A&P                             Medical Decision Making Pt with chest pain and concerns for elevated blood pressure. Recently began taking different anti hypertensive. Has been trying holistic therapies.    Diff includes ACS, PE,  anxiety, MSK, GERD, hypertensive uregency/crisis  Amount and/or Complexity of Data Reviewed Labs: ordered.    Details: Labs show no evidence of end organ damage, anemia or leukocytosis.  D dimer slightly elevated. Radiology: ordered.    Details: CXR w/o acute CP process  CTA PE study ordered for further  evaluation for possible PE.  No evidence of PE. Possible Cyst of left adrenal gland.    Risk Prescription drug management. Risk Details: Pt given Ativan here, now appears calmer.  Vitals improved, encouraged to take his prescribed BP medications as directed.  Sx's felt to be stress/anxiety related.  He agrees to close out pt f/u.  Pt was informed of CT findings and will have PCP f/u regarding possible adrenal gland cyst.  I will provide short course of Ativan until he can f/u with PCP, database reviewed.               Final Clinical Impression(s) / ED Diagnoses Final diagnoses:  Chest pain, unspecified type  Anxiety    Rx / DC Orders ED Discharge Orders          Ordered    LORazepam (ATIVAN) 1 MG tablet  Every 8 hours PRN        07/31/22 2226              Kem Parkinson, PA-C 08/03/22 0046    Godfrey Pick, MD 08/10/22 [phone]

## 2022-08-08 ENCOUNTER — Ambulatory Visit: Payer: Self-pay | Admitting: Family Medicine

## 2022-08-09 ENCOUNTER — Encounter: Payer: Self-pay | Admitting: Family Medicine

## 2022-08-14 ENCOUNTER — Ambulatory Visit (INDEPENDENT_AMBULATORY_CARE_PROVIDER_SITE_OTHER): Payer: PRIVATE HEALTH INSURANCE | Admitting: Clinical

## 2022-08-14 ENCOUNTER — Encounter (HOSPITAL_COMMUNITY): Payer: Self-pay

## 2022-08-14 DIAGNOSIS — F4310 Post-traumatic stress disorder, unspecified: Secondary | ICD-10-CM | POA: Diagnosis not present

## 2022-08-14 DIAGNOSIS — F411 Generalized anxiety disorder: Secondary | ICD-10-CM | POA: Diagnosis not present
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
## 2022-08-19 ENCOUNTER — Ambulatory Visit: Payer: Self-pay | Admitting: Family Medicine

## 2022-08-19 ENCOUNTER — Encounter: Payer: Self-pay | Admitting: Family Medicine

## 2022-08-20 ENCOUNTER — Emergency Department (HOSPITAL_COMMUNITY): Payer: PRIVATE HEALTH INSURANCE

## 2022-08-20 ENCOUNTER — Encounter (HOSPITAL_COMMUNITY): Payer: Self-pay | Admitting: *Deleted

## 2022-08-20 ENCOUNTER — Other Ambulatory Visit: Payer: Self-pay

## 2022-08-20 ENCOUNTER — Emergency Department (HOSPITAL_COMMUNITY)
Admission: EM | Admit: 2022-08-20 | Discharge: 2022-08-20 | Disposition: A | Discharge status: Home / Self Care | Payer: PRIVATE HEALTH INSURANCE | Attending: Emergency Medicine | Admitting: Emergency Medicine

## 2022-08-20 DIAGNOSIS — R6883 Chills (without fever): Secondary | ICD-10-CM | POA: Insufficient documentation

## 2022-08-20 DIAGNOSIS — R55 Syncope and collapse: Secondary | ICD-10-CM | POA: Diagnosis not present

## 2022-08-20 DIAGNOSIS — M542 Cervicalgia: Secondary | ICD-10-CM | POA: Insufficient documentation

## 2022-08-20 DIAGNOSIS — R0602 Shortness of breath: Secondary | ICD-10-CM | POA: Insufficient documentation

## 2022-08-20 DIAGNOSIS — F419 Anxiety disorder, unspecified: Secondary | ICD-10-CM | POA: Insufficient documentation

## 2022-08-20 DIAGNOSIS — R42 Dizziness and giddiness: Secondary | ICD-10-CM | POA: Insufficient documentation

## 2022-08-20 DIAGNOSIS — Z79899 Other long term (current) drug therapy: Secondary | ICD-10-CM | POA: Insufficient documentation

## 2022-08-20 DIAGNOSIS — Z1152 Encounter for screening for COVID-19: Secondary | ICD-10-CM | POA: Diagnosis not present

## 2022-08-20 LAB — BASIC METABOLIC PANEL
Anion gap: 9 (ref 5–15)
BUN: 14 mg/dL (ref 6–20)
CO2: 29 mmol/L (ref 22–32)
Calcium: 9.8 mg/dL (ref 8.9–10.3)
Chloride: 101 mmol/L (ref 98–111)
Creatinine, Ser: 1.01 mg/dL (ref 0.61–1.24)
GFR, Estimated: >60 mL/min (ref >60)
Glucose, Bld: 97 mg/dL (ref 70–99)
Potassium: 3.8 mmol/L (ref 3.5–5.1)
Sodium: 139 mmol/L (ref 135–145)

## 2022-08-20 LAB — CBC
HCT: 48.3 % (ref 39.0–52.0)
Hemoglobin: 16.5 g/dL (ref 13.0–17.0)
MCH: 32.7 pg (ref 26.0–34.0)
MCHC: 34.2 g/dL (ref 30.0–36.0)
MCV: 95.8 fL (ref 80.0–100.0)
Platelets: 190 10*3/uL (ref 150–400)
RBC: 5.04 MIL/uL (ref 4.22–5.81)
RDW: 12.5 % (ref 11.5–15.5)
WBC: 4.9 10*3/uL (ref 4.0–10.5)
nRBC: 0 % (ref 0.0–0.2)

## 2022-08-20 LAB — URINALYSIS, ROUTINE W REFLEX MICROSCOPIC
Bilirubin Urine: NEGATIVE
Glucose, UA: NEGATIVE mg/dL
Hgb urine dipstick: NEGATIVE
Ketones, ur: NEGATIVE mg/dL
Leukocytes,Ua: NEGATIVE
Nitrite: NEGATIVE
Protein, ur: NEGATIVE mg/dL
Specific Gravity, Urine: 1.004 — ABNORMAL LOW (ref 1.005–1.030)
pH: 7 (ref 5.0–8.0)

## 2022-08-20 LAB — RESP PANEL BY RT-PCR (RSV, FLU A&B, COVID)  RVPGX2
Influenza A by PCR: NEGATIVE
Influenza B by PCR: NEGATIVE
Resp Syncytial Virus by PCR: NEGATIVE
SARS Coronavirus 2 by RT PCR: NEGATIVE

## 2022-08-20 LAB — TROPONIN I (HIGH SENSITIVITY): Troponin I (High Sensitivity): 4 ng/L (ref <18)

## 2022-08-20 LAB — CBG MONITORING, ED: Glucose-Capillary: 87 mg/dL (ref 70–99)

## 2022-08-20 MED ORDER — LORAZEPAM 1 MG PO TABS
2.0000 mg | ORAL_TABLET | ORAL | Status: AC
  Administered 2022-08-20: 2 mg via ORAL
  Filled 2022-08-20: qty 2

## 2022-08-20 MED ORDER — IOHEXOL 350 MG/ML SOLN
75.0000 mL | Freq: Once | INTRAVENOUS | Status: AC | PRN
  Administered 2022-08-20: 75 mL via INTRAVENOUS

## 2022-08-20 MED ORDER — LACTATED RINGERS IV BOLUS
1000.0000 mL | Freq: Once | INTRAVENOUS | Status: AC
  Administered 2022-08-20: 1000 mL via INTRAVENOUS

## 2022-08-20 MED ORDER — LORAZEPAM 1 MG PO TABS: 1.0000 mg | ORAL_TABLET | ORAL | Status: DC

## 2022-08-20 NOTE — ED Provider Notes (Signed)
[facility] Provider Note   CSN: [id number] Arrival date & time: 08/20/22  1122     History {Add pertinent medical, surgical, social history, OB history to HPI:1} Chief Complaint  Patient presents with   Near Syncope    Gerald Phillips is a 42 y.o. male.  42 year old male with history of anxiety and hypertension who presents to the emergency department with neck pain, dizziness, and chills.  Patient reports that today he started experiencing some neck pain and dizziness.  No trauma to his neck.  Says that he has unsure if it felt like the room was spinning or if he was going to pass out.  No exacerbating factors including turning his head or standing.  Thinks it may be worsened by his anxiety which he takes Ativan for.  Does feel clammy at this time and has had chills.  Says he has mild shortness of breath but no cough.  No significant chest pain.  Reports this has happened over the past 4 months several times and that he was in the emergency department in January for similar symptoms and had a head CT and reassuring evaluation and was discharged home.  No history of cardiac disease aside from hypertension.  Denies any recreational substance use or significant alcohol use.  Does smoke cigarettes.       Home Medications Prior to Admission medications   Medication Sig Start Date End Date Taking? Authorizing Provider  amLODipine (NORVASC) 10 MG tablet Take 10 mg by mouth daily. 08/06/22  Yes [provider]  chlorthalidone (HYGROTON) 25 MG tablet Take 1 tablet (25 mg total) by mouth daily. 07/25/22  Yes Carlisle Cater, PA-C  losartan-hydrochlorothiazide (HYZAAR) 50-12.5 MG tablet Take 1 tablet by mouth daily. 08/06/22  Yes [provider]  OVER THE COUNTER MEDICATION Vitamin D3 with K2   Yes [provider]  OVER THE COUNTER MEDICATION Take 1 tablet by mouth daily. Vitamin b6,folic acid,vitamin d and vitamin b12   Yes  [provider]  OVER THE COUNTER MEDICATION Take 1 tablet by mouth daily. Scription BP   Yes [provider]  LORazepam (ATIVAN) 1 MG tablet Take 1 tablet (1 mg total) by mouth every 8 (eight) hours as needed for anxiety. Patient not taking: Reported on 08/20/2022 07/31/22   Triplett, Tammy, PA-C  telmisartan (MICARDIS) 40 MG tablet TAKE 1 TABLET BY MOUTH EVERY DAY Patient not taking: Reported on 08/20/2022 08/02/22   Del Eli Hose, FNP      Allergies    Penicillins and Penicillins    Review of Systems   Review of Systems  Physical Exam Updated Vital Signs BP (!) 157/108   Pulse 72   Temp 98.2 F (36.8 C) (Oral)   Resp 11   SpO2 100%  Physical Exam Vitals and nursing note reviewed.  Constitutional:      General: He is not in acute distress.    Appearance: He is well-developed.     Comments: Anxious appearing.  Wet washcloth on his forehead.  HENT:     Head: Normocephalic and atraumatic.     Right Ear: External ear normal.     Left Ear: External ear normal.     Nose: Nose normal.  Eyes:     Extraocular Movements: Extraocular movements intact.     Conjunctiva/sclera: Conjunctivae normal.     Pupils: Pupils are equal, round, and reactive to light.  Cardiovascular:     Rate and Rhythm: Normal  rate and regular rhythm.     Heart sounds: Normal heart sounds.  Pulmonary:     Effort: Pulmonary effort is normal. No respiratory distress.     Breath sounds: Normal breath sounds.  Abdominal:     General: There is no distension.     Palpations: Abdomen is soft. There is no mass.     Tenderness: There is no abdominal tenderness. There is no guarding.  Musculoskeletal:     Cervical back: Normal range of motion and neck supple.     Right lower leg: No edema.     Left lower leg: No edema.  Skin:    General: Skin is warm and dry.  Neurological:     Mental Status: He is alert and oriented to person, place, and time. Mental status is at baseline.      Cranial Nerves: No cranial nerve deficit.     Sensory: No sensory deficit.     Motor: No weakness.     Gait: Gait normal.  Psychiatric:        Behavior: Behavior normal.     Comments: Anxious.     ED Results / Procedures / Treatments   Labs (all labs ordered are listed, but only abnormal results are displayed) Labs Reviewed  CBC  BASIC METABOLIC PANEL  URINALYSIS, ROUTINE W REFLEX MICROSCOPIC  CBG MONITORING, ED    EKG None  Radiology No results found.  Procedures Procedures  {Document cardiac monitor, telemetry assessment procedure when appropriate:1}  Medications Ordered in ED Medications - No data to display  ED Course/ Medical Decision Making/ A&P   {   Click here for ABCD2, HEART and other calculatorsREFRESH Note before signing :1}                          Medical Decision Making Amount and/or Complexity of Data Reviewed Labs: ordered. Radiology: ordered.  Risk Prescription drug management.   ***  {Document critical care time when appropriate:1} {Document review of labs and clinical decision tools ie heart score, Chads2Vasc2 etc:1}  {Document your independent review of radiology images, and any outside records:1} {Document your discussion with family members, caretakers, and with consultants:1} {Document social determinants of health affecting pt's care:1} {Document your decision making why or why not admission, treatments were needed:1} Final Clinical Impression(s) / ED Diagnoses Final diagnoses:  None    Rx / DC Orders ED Discharge Orders     None

## 2022-08-20 NOTE — ED Triage Notes (Signed)
Pt is here due to episode of near syncope.  Pt states that he was fixing breakfast when he became weak and shaky and sweaty and had to lower himself to the ground.  Pt is wet with sweat.  No CP.  Pt reports some some tightness in neck which has been going on for 4 months.  No CP

## 2022-08-20 NOTE — Discharge Instructions (Addendum)
You were seen for your dizziness in the emergency department.  You had a reassuring evaluation.  At home, please continue to take your medications for anxiety and be sure to stay well-hydrated.    Follow-up with your primary doctor in 2-3 days regarding your visit.    Return immediately to the emergency department if you experience any of the following: Fainting, difficulty breathing, or any other concerning symptoms.    Thank you for visiting our Emergency Department. It was a pleasure taking care of you today.

## 2022-08-21 ENCOUNTER — Telehealth: Payer: PRIVATE HEALTH INSURANCE | Admitting: Family Medicine

## 2022-08-21 DIAGNOSIS — F419 Anxiety disorder, unspecified: Secondary | ICD-10-CM

## 2022-08-21 DIAGNOSIS — M62838 Other muscle spasm: Secondary | ICD-10-CM | POA: Diagnosis not present

## 2022-08-21 MED ORDER — CYCLOBENZAPRINE HCL 10 MG PO TABS: 10.0000 mg | ORAL_TABLET | Freq: Three times a day (TID) | ORAL | 0 refills | Status: AC | PRN

## 2022-08-21 NOTE — Patient Instructions (Signed)
Generalized Anxiety Disorder, Adult ?Generalized anxiety disorder (GAD) is a mental health condition. Unlike normal worries, anxiety related to GAD is not triggered by a specific event. These worries do not fade or get better with time. GAD interferes with relationships, work, and school. ?GAD symptoms can vary from mild to severe. People with severe GAD can have intense waves of anxiety with physical symptoms that are similar to panic attacks. ?What are the causes? ?The exact cause of GAD is not known, but the following are believed to have an impact: ?Differences in natural brain chemicals. ?Genes passed down from parents to children. ?Differences in the way threats are perceived. ?Development and stress during childhood. ?Personality. ?What increases the risk? ?The following factors may make you more likely to develop this condition: ?Being male. ?Having a family history of anxiety disorders. ?Being very shy. ?Experiencing very stressful life events, such as the death of a loved one. ?Having a very stressful family environment. ?What are the signs or symptoms? ?People with GAD often worry excessively about many things in their lives, such as their health and family. Symptoms may also include: ?Mental and emotional symptoms: ?Worrying excessively about natural disasters. ?Fear of being late. ?Difficulty concentrating. ?Fears that others are judging your performance. ?Physical symptoms: ?Fatigue. ?Headaches, muscle tension, muscle twitches, trembling, or feeling shaky. ?Feeling like your heart is pounding or beating very fast. ?Feeling out of breath or like you cannot take a deep breath. ?Having trouble falling asleep or staying asleep, or experiencing restlessness. ?Sweating. ?Nausea, diarrhea, or irritable bowel syndrome (IBS). ?Behavioral symptoms: ?Experiencing erratic moods or irritability. ?Avoidance of new situations. ?Avoidance of people. ?Extreme difficulty making decisions. ?How is this diagnosed? ?This  condition is diagnosed based on your symptoms and medical history. You will also have a physical exam. Your health care provider may perform tests to rule out other possible causes of your symptoms. ?To be diagnosed with GAD, a person must have anxiety that: ?Is out of his or her control. ?Affects several different aspects of his or her life, such as work and relationships. ?Causes distress that makes him or her unable to take part in normal activities. ?Includes at least three symptoms of GAD, such as restlessness, fatigue, trouble concentrating, irritability, muscle tension, or sleep problems. ?Before your health care provider can confirm a diagnosis of GAD, these symptoms must be present more days than they are not, and they must last for 6 months or longer. ?How is this treated? ?This condition may be treated with: ?Medicine. Antidepressant medicine is usually prescribed for long-term daily control. Anti-anxiety medicines may be added in severe cases, especially when panic attacks occur. ?Talk therapy (psychotherapy). Certain types of talk therapy can be helpful in treating GAD by providing support, education, and guidance. Options include: ?Cognitive behavioral therapy (CBT). People learn coping skills and self-calming techniques to ease their physical symptoms. They learn to identify unrealistic thoughts and behaviors and to replace them with more appropriate thoughts and behaviors. ?Acceptance and commitment therapy (ACT). This treatment teaches people how to be mindful as a way to cope with unwanted thoughts and feelings. ?Biofeedback. This process trains you to manage your body's response (physiological response) through breathing techniques and relaxation methods. You will work with a therapist while machines are used to monitor your physical symptoms. ?Stress management techniques. These include yoga, meditation, and exercise. ?A mental health specialist can help determine which treatment is best for you.  Some people see improvement with one type of therapy. However, other people require   a combination of therapies. Follow these instructions at home: Lifestyle Maintain a consistent routine and schedule. Anticipate stressful situations. Create a plan and allow extra time to work with your plan. Practice stress management or self-calming techniques that you have learned from your therapist or your health care provider. Exercise regularly and spend time outdoors. Eat a healthy diet that includes plenty of vegetables, fruits, whole grains, low-fat dairy products, and lean protein. Do not eat a lot of foods that are high in fat, added sugar, or salt (sodium). Drink plenty of water. Avoid alcohol. Alcohol can increase anxiety. Avoid caffeine and certain over-the-counter cold medicines. These may make you feel worse. Ask your pharmacist which medicines to avoid. General instructions Take over-the-counter and prescription medicines only as told by your health care provider. Understand that you are likely to have setbacks. Accept this and be kind to yourself as you persist to take better care of yourself. Anticipate stressful situations. Create a plan and allow extra time to work with your plan. Recognize and accept your accomplishments, even if you judge them as small. Spend time with people who care about you. Keep all follow-up visits. This is important. Where to find more information [place]: https://carter.com/ Substance Abuse and Mental Health Services: ktimeonline.com Contact a health care provider if: Your symptoms do not get better. Your symptoms get worse. You have signs of depression, such as: A persistently sad or irritable mood. Loss of enjoyment in activities that used to bring you joy. Change in weight or eating. Changes in sleeping habits. Get help right away if: You have thoughts about hurting yourself or others. If you ever feel like you may hurt  yourself or others, or have thoughts about taking your own life, get help right away. Go to your nearest emergency department or: Call your local emergency services (911 in the [place] Call a suicide crisis helpline, such as the [facility] at 405-716-5600 or 988 in the [place]. This is open 24 hours a day in the [place] Text the Crisis Text Line at 807-731-3564 (in the [place].). Summary Generalized anxiety disorder (GAD) is a mental health condition that involves worry that is not triggered by a specific event. People with GAD often worry excessively about many things in their lives, such as their health and family. GAD may cause symptoms such as restlessness, trouble concentrating, sleep problems, frequent sweating, nausea, diarrhea, headaches, and trembling or muscle twitching. A mental health specialist can help determine which treatment is best for you. Some people see improvement with one type of therapy. However, other people require a combination of therapies. This information is not intended to replace advice given to you by your health care provider. Make sure you discuss any questions you have with your health care provider. Document Revised: 01/17/2021 Document Reviewed: 10/15/2020 Elsevier Patient Education  [place]. Muscle Cramps and Spasms Muscle cramps and spasms occur when a muscle or muscles tighten and you have no control over this tightening (involuntary muscle contraction). They are a common problem and can develop in any muscle. The most common place is in the calf muscles of the leg. Muscle cramps and muscle spasms are both involuntary muscle contractions, but there are some differences between the two: Muscle cramps are painful. They come and go and may last for a few seconds or up to 15 minutes. Muscle cramps are often more forceful and last longer than muscle spasms. Muscle spasms may or may not be painful. They  may also last just a few seconds or much  longer. Certain medical conditions, such as diabetes or Parkinson's disease, can make it more likely to develop cramps or spasms. However, cramps or spasms are usually not caused by a serious underlying problem. Common causes include: Doing more physical work or exercise than your body is ready for (overexertion). Overuse from repeating certain movements too many times. Remaining in a certain position for a long period of time. Improper preparation, form, or technique while playing a sport or doing an activity. Dehydration. Injury. Side effects of some medicines. Abnormally low levels of the salts and minerals in your blood (electrolytes), especially potassium and calcium. This could happen if you are taking water pills (diuretics) or if you are pregnant. In many cases, the cause of muscle cramps or spasms is not known. Follow these instructions at home: Managing pain and stiffness     Try massaging, stretching, and relaxing the affected muscle. Do this for several minutes at a time. If directed, apply heat to tight or tense muscles as often as told by your health care provider. Use the heat source that your health care provider recommends, such as a moist heat pack or a heating pad. Place a towel between your skin and the heat source. Leave the heat on for 20-30 minutes. Remove the heat if your skin turns bright red. This is especially important if you are unable to feel pain, heat, or cold. You may have a greater risk of getting burned. If directed, put ice on the affected area. This may help if you are sore or have pain after a cramp or spasm. Put ice in a plastic bag. Place a towel between your skin and the bag. Leave the ice on for 20 minutes, 2-3 times a day. Try taking hot showers or baths to help relax tight muscles. Eating and drinking Drink enough fluid to keep your urine pale yellow. Staying well hydrated may help prevent cramps or spasms. Eat a healthy diet that includes  plenty of nutrients to help your muscles function. A healthy diet includes fruits and vegetables, lean protein, whole grains, and low-fat or nonfat dairy products. General instructions If you are having frequent cramps, avoid intense exercise for several days. Take over-the-counter and prescription medicines only as told by your health care provider. Pay attention to any changes in your symptoms. Keep all follow-up visits as told by your health care provider. This is important. Contact a health care provider if: Your cramps or spasms get more severe or happen more often. Your cramps or spasms do not improve over time. Summary Muscle cramps and spasms occur when a muscle or muscles tighten and you have no control over this tightening (involuntary muscle contraction). The most common place for cramps or spasms to occur is in the calf muscles of the leg. Massaging, stretching, and relaxing the affected muscle may relieve the cramp or spasm. Drink enough fluid to keep your urine pale yellow. Staying well hydrated may help prevent cramps or spasms. This information is not intended to replace advice given to you by your health care provider. Make sure you discuss any questions you have with your health care provider. Document Revised: 01/12/2021 Document Reviewed: 01/12/2021 Elsevier Patient Education  [place].
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
## 2022-08-26 ENCOUNTER — Encounter: Payer: Self-pay | Admitting: Family Medicine

## 2022-08-26 ENCOUNTER — Ambulatory Visit: Payer: PRIVATE HEALTH INSURANCE | Admitting: Family Medicine

## 2022-08-26 VITALS — BP 158/118 | HR 99 | Ht 71.0 in | Wt 224.0 lb

## 2022-08-26 DIAGNOSIS — F419 Anxiety disorder, unspecified: Secondary | ICD-10-CM

## 2022-08-26 DIAGNOSIS — E278 Other specified disorders of adrenal gland: Secondary | ICD-10-CM

## 2022-08-26 DIAGNOSIS — I10 Essential (primary) hypertension: Secondary | ICD-10-CM

## 2022-08-26 MED ORDER — AMLODIPINE BESYLATE 10 MG PO TABS: 10.0000 mg | ORAL_TABLET | Freq: Every day | ORAL | 2 refills | Status: DC

## 2022-08-26 MED ORDER — HYDROXYZINE HCL 10 MG PO TABS: 10.0000 mg | ORAL_TABLET | Freq: Three times a day (TID) | ORAL | 1 refills | Status: DC | PRN

## 2022-08-26 NOTE — Progress Notes (Unsigned)
  Patient Office Visit   Subjective   Patient ID: Gerald Phillips, male    DOB: Nov 11, 1980  Age: 42 y.o. MRN: [id number]  CC:  Chief Complaint  Patient presents with   Anxiety    Patient complains of dizziness, anxiety and tension in his spine that he feels is all related.    Dizziness    HPI [name] presents to establish care. He  has a past medical history of Anxiety, GERD (gastroesophageal reflux disease), Gout, and Hypertension.  Dizziness This is a recurrent problem. The current episode started more than 1 month ago. The problem occurs daily. The problem has been gradually worsening. Associated symptoms include fatigue, headaches, neck pain and weakness. The symptoms are aggravated by walking and stress. The treatment provided mild relief.      Outpatient Encounter Medications as of 08/26/2022  Medication Sig   amLODipine (NORVASC) 10 MG tablet Take 10 mg by mouth daily.   chlorthalidone (HYGROTON) 25 MG tablet Take 1 tablet (25 mg total) by mouth daily.   cyclobenzaprine (FLEXERIL) 10 MG tablet Take 1 tablet (10 mg total) by mouth 3 (three) times daily as needed for muscle spasms.   LORazepam (ATIVAN) 1 MG tablet Take 1 tablet (1 mg total) by mouth every 8 (eight) hours as needed for anxiety.   OVER THE COUNTER MEDICATION Vitamin D3 with K2   OVER THE COUNTER MEDICATION Take 1 tablet by mouth daily. Vitamin b6,folic acid,vitamin d and vitamin b12   OVER THE COUNTER MEDICATION Take 1 tablet by mouth daily. Scription BP   telmisartan (MICARDIS) 40 MG tablet TAKE 1 TABLET BY MOUTH EVERY DAY   losartan-hydrochlorothiazide (HYZAAR) 50-12.5 MG tablet Take 1 tablet by mouth daily. (Patient not taking: Reported on 08/26/2022)   No facility-administered encounter medications on file as of 08/26/2022.    Past Surgical History:  Procedure Laterality Date   LAPAROTOMY N/A 08/25/2019   Procedure: EXPLORATORY LAPAROTOMY, RENAL EXPLORATION CLOSURE OF RETROPERITONEUM;  Surgeon: Donnie Mesa, MD;  Location: [facility];  Service: General;  Laterality: N/A;    Review of Systems  Constitutional:  Positive for fatigue.  Musculoskeletal:  Positive for neck pain.  Neurological:  Positive for dizziness, weakness and headaches.      Objective    BP (!) 158/118   Pulse 99   Ht 5' 11"$  (1.803 m)   Wt 224 lb (101.6 kg)   SpO2 97%   BMI 31.24 kg/m   Physical Exam    Assessment & Plan:  There are no diagnoses linked to this encounter.  No follow-ups on file.   Renard Hamper Ria Comment, FNP

## 2022-08-26 NOTE — Patient Instructions (Signed)
It was pleasure meeting with you today. Please follow up with Endocrinology referral for further evaluation.  Please take medications as prescribed. Follow up with your primary health provider if any health concerns arises. If symptoms worsen please contact your primary care provider and/or visit the emergency department. Follow up in 2 weeks

## 2022-08-27 ENCOUNTER — Ambulatory Visit: Payer: PRIVATE HEALTH INSURANCE | Admitting: Family Medicine

## 2022-08-27 DIAGNOSIS — E278 Other specified disorders of adrenal gland: Secondary | ICD-10-CM | POA: Insufficient documentation

## 2022-08-27 NOTE — Assessment & Plan Note (Addendum)
Vitals:   08/26/22 1607 08/26/22 1611  BP: (!) 156/116 (!) 158/118  As per our las visit, patient stated not taking telmisartan 40 mg due to "making him feel worse"  Currently taking chlorthalidone 25 mg and amlodipine 10 mg even though as per our last visit it "made him feel flushed" I explained to patient blood pressure still not controlled and advise to start him on amlodipine-benazepril combination pill for blood pressure management, patient refused medication intervention and reported he will manage his anxiety better and incorporate DASH diet and exercise routine. I explained the importance of medication compliance. Discussed signs and symptoms of major cardiovascular event. Patient verbalizes understanding regarding plan of care and all questions answered. Follow up in 2 weeks or sooner if needed.

## 2022-08-27 NOTE — Assessment & Plan Note (Addendum)
    08/26/2022    4:10 PM 08/14/2022   10:30 AM 08/14/2022   10:29 AM 07/11/2022   10:20 AM  GAD 7 : Generalized Anxiety Score  Nervous, Anxious, on Edge 3   1  Control/stop worrying 2   1  Worry too much - different things 2   1  Trouble relaxing 3   1  Restless 0   1  Easily annoyed or irritable 0   1  Afraid - awful might happen 1   1  Total GAD 7 Score 11   7  Anxiety Difficulty Somewhat difficult   Somewhat difficult     Information is confidential and restricted. Go to Review Flowsheets to unlock data.   Discussed non pharmacological interventions such as continue speaking with his therapist, support system, diet, exercise. sleep. Prescribed hydroxyzine 10 mg PRN as needed. Patient verbalizes understanding regarding plan of care and all questions answered.

## 2022-08-27 NOTE — Assessment & Plan Note (Addendum)
Patient CT on 1/24 showed left adrenal gland which was only partially imaged measures 7 cm. Patient is consistent with symptoms of dizziness nausea, sweating, anxiety, headaches, tachycardia, and high blood pressure. Referral to endocrinology for further evaluation possible rule out pheochromocytoma

## 2022-08-31 ENCOUNTER — Encounter (HOSPITAL_COMMUNITY): Payer: Self-pay | Admitting: *Deleted

## 2022-08-31 ENCOUNTER — Emergency Department (HOSPITAL_COMMUNITY)
Admission: EM | Admit: 2022-08-31 | Discharge: 2022-08-31 | Disposition: A | Discharge status: Home / Self Care | Payer: PRIVATE HEALTH INSURANCE | Attending: Emergency Medicine | Admitting: Emergency Medicine

## 2022-08-31 ENCOUNTER — Emergency Department (HOSPITAL_COMMUNITY): Payer: PRIVATE HEALTH INSURANCE

## 2022-08-31 ENCOUNTER — Other Ambulatory Visit: Payer: Self-pay

## 2022-08-31 DIAGNOSIS — Z79899 Other long term (current) drug therapy: Secondary | ICD-10-CM | POA: Insufficient documentation

## 2022-08-31 DIAGNOSIS — R079 Chest pain, unspecified: Secondary | ICD-10-CM | POA: Diagnosis not present

## 2022-08-31 DIAGNOSIS — F419 Anxiety disorder, unspecified: Secondary | ICD-10-CM | POA: Diagnosis not present

## 2022-08-31 DIAGNOSIS — R42 Dizziness and giddiness: Secondary | ICD-10-CM | POA: Insufficient documentation

## 2022-08-31 DIAGNOSIS — I10 Essential (primary) hypertension: Secondary | ICD-10-CM | POA: Diagnosis not present

## 2022-08-31 DIAGNOSIS — R0789 Other chest pain: Secondary | ICD-10-CM | POA: Diagnosis not present

## 2022-08-31 LAB — TROPONIN I (HIGH SENSITIVITY)
Troponin I (High Sensitivity): 2 ng/L (ref <18)
Troponin I (High Sensitivity): 3 ng/L (ref <18)

## 2022-08-31 LAB — CBC WITH DIFFERENTIAL/PLATELET
Abs Immature Granulocytes: 0.02 10*3/uL (ref 0.00–0.07)
Basophils Absolute: 0 10*3/uL (ref 0.0–0.1)
Basophils Relative: 0 %
Eosinophils Absolute: 0 10*3/uL (ref 0.0–0.5)
Eosinophils Relative: 1 %
HCT: 46 % (ref 39.0–52.0)
Hemoglobin: 15.8 g/dL (ref 13.0–17.0)
Immature Granulocytes: 0 %
Lymphocytes Relative: 25 %
Lymphs Abs: 1.2 10*3/uL (ref 0.7–4.0)
MCH: 33.1 pg (ref 26.0–34.0)
MCHC: 34.3 g/dL (ref 30.0–36.0)
MCV: 96.4 fL (ref 80.0–100.0)
Monocytes Absolute: 0.4 10*3/uL (ref 0.1–1.0)
Monocytes Relative: 9 %
Neutro Abs: 3.1 10*3/uL (ref 1.7–7.7)
Neutrophils Relative %: 65 %
Platelets: 172 10*3/uL (ref 150–400)
RBC: 4.77 MIL/uL (ref 4.22–5.81)
RDW: 12.7 % (ref 11.5–15.5)
WBC: 4.8 10*3/uL (ref 4.0–10.5)
nRBC: 0 % (ref 0.0–0.2)

## 2022-08-31 LAB — COMPREHENSIVE METABOLIC PANEL
ALT: 30 U/L (ref 0–44)
AST: 25 U/L (ref 15–41)
Albumin: 4.7 g/dL (ref 3.5–5.0)
Alkaline Phosphatase: 51 U/L (ref 38–126)
Anion gap: 10 (ref 5–15)
BUN: 13 mg/dL (ref 6–20)
CO2: 26 mmol/L (ref 22–32)
Calcium: 9.4 mg/dL (ref 8.9–10.3)
Chloride: 102 mmol/L (ref 98–111)
Creatinine, Ser: 0.95 mg/dL (ref 0.61–1.24)
GFR, Estimated: >60 mL/min (ref >60)
Glucose, Bld: 99 mg/dL (ref 70–99)
Potassium: 4.2 mmol/L (ref 3.5–5.1)
Sodium: 138 mmol/L (ref 135–145)
Total Bilirubin: 0.7 mg/dL (ref 0.3–1.2)
Total Protein: 7.7 g/dL (ref 6.5–8.1)

## 2022-08-31 LAB — URINALYSIS, ROUTINE W REFLEX MICROSCOPIC
Bilirubin Urine: NEGATIVE
Glucose, UA: NEGATIVE mg/dL
Hgb urine dipstick: NEGATIVE
Ketones, ur: NEGATIVE mg/dL
Leukocytes,Ua: NEGATIVE
Nitrite: NEGATIVE
Protein, ur: NEGATIVE mg/dL
Specific Gravity, Urine: 1.002 — ABNORMAL LOW (ref 1.005–1.030)
pH: 7 (ref 5.0–8.0)

## 2022-08-31 LAB — MAGNESIUM: Magnesium: 2.1 mg/dL (ref 1.7–2.4)

## 2022-08-31 MED ORDER — LORAZEPAM 0.5 MG PO TABS
0.5000 mg | ORAL_TABLET | Freq: Once | ORAL | Status: AC
  Administered 2022-08-31: 0.5 mg via ORAL
  Filled 2022-08-31: qty 1

## 2022-08-31 MED ORDER — HYDROXYZINE HCL 25 MG PO TABS: 25.0000 mg | ORAL_TABLET | Freq: Once | ORAL | Status: DC

## 2022-08-31 NOTE — ED Triage Notes (Addendum)
Pt c/o multiple symptoms - headache, foggy headed, cold and hot chills, dizziness, ears ringing, chest pain, and neck pain. Pt reports these symptoms have been intermittent over the last few months, but constant since night before last. LKW 08/29/22.

## 2022-08-31 NOTE — Discharge Instructions (Signed)
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  I believe that your symptoms may be likely secondary to anxiety, but you need further evaluation from your primary care doctor.  Today showed no life-threatening illnesses.  Return to the ER if you have severe chest pain, shortness of breath, dizziness, nausea or vomiting.

## 2022-08-31 NOTE — ED Provider Notes (Signed)
[facility] Provider Note   CSN: [id number] Arrival date & time: 08/31/22  [id number]     History  No chief complaint on file.   Gerald Phillips is a 42 y.o. male, history of hypertension, anxiety, who presents to the ED secondary to hot and cold spells, presyncopal episodes, and feelings of chest tightness.  He states he has been having the symptoms over the last 4 to 5 months, and that he was good for about 4 days, but now they are back.  He notes that he has had multiple CT scans, that showed that his head was fine, and that his belly is fine, the only thing he states that they have found, is that he has a cyst on his adrenal gland.  He states he has an endocrinology referral, that is pending at this time.  States he is having the hot and cold spells again, and he feels dizzy at times, like he is the only thing moving in the room and that he needs to hold onto a shopping cart or his wife.  He notes he has also been under a lot of stress, and is currently seeing a therapist, has only seen them 1 times, and if he states that he feels like he needs to go more.  He notes that the chest tightness is in the middle of his chest, and episodic, states that he has been stressed out because his wife may leave him as she is tired of dealing with his health complaints.  Denies any shortness of breath nausea, vomiting.  Just feels like something is not right.  Also complains of a intermittent headache/lightheaded feeling, no weakness numbness and arms or legs.     Home Medications Prior to Admission medications   Medication Sig Start Date End Date Taking? Authorizing Provider  amLODipine (NORVASC) 10 MG tablet Take 1 tablet (10 mg total) by mouth daily. 08/26/22   Del Eli Hose, FNP  chlorthalidone (HYGROTON) 25 MG tablet Take 1 tablet (25 mg total) by mouth daily. 07/25/22   Carlisle Cater, PA-C  cyclobenzaprine (FLEXERIL) 10 MG tablet Take 1 tablet (10 mg  total) by mouth 3 (three) times daily as needed for muscle spasms. 08/21/22   Tempie Hoist, FNP  hydrOXYzine (ATARAX) 10 MG tablet Take 1 tablet (10 mg total) by mouth 3 (three) times daily as needed for anxiety. 08/26/22   Del Eli Hose, FNP  LORazepam (ATIVAN) 1 MG tablet Take 1 tablet (1 mg total) by mouth every 8 (eight) hours as needed for anxiety. 07/31/22   Triplett, Tammy, PA-C  OVER THE COUNTER MEDICATION Vitamin D3 with K2    [provider]  OVER THE COUNTER MEDICATION Take 1 tablet by mouth daily. Vitamin b6,folic acid,vitamin d and vitamin b12    [provider]  OVER THE COUNTER MEDICATION Take 1 tablet by mouth daily. Scription BP    [provider]      Allergies    Penicillins and Penicillins    Review of Systems   Review of Systems  Respiratory:  Positive for chest tightness. Negative for shortness of breath.     Physical Exam Updated Vital Signs BP (!) 161/101 (BP Location: Left Arm)   Pulse 77   Temp 98.3 F (36.8 C) (Oral)   Resp 15   Ht '5\' 11"'$  (1.803 m)   Wt 98.4 kg   SpO2 99%   BMI 30.27 kg/m  Physical Exam  Vitals and nursing note reviewed.  Constitutional:      General: He is not in acute distress.    Appearance: He is well-developed.  HENT:     Head: Normocephalic and atraumatic.  Eyes:     Conjunctiva/sclera: Conjunctivae normal.  Cardiovascular:     Rate and Rhythm: Normal rate and regular rhythm.     Heart sounds: No murmur heard. Pulmonary:     Effort: Pulmonary effort is normal. No respiratory distress.     Breath sounds: Normal breath sounds.  Abdominal:     Palpations: Abdomen is soft.     Tenderness: There is no abdominal tenderness.  Musculoskeletal:        General: No swelling.     Cervical back: Neck supple.  Skin:    General: Skin is warm and dry.     Capillary Refill: Capillary refill takes less than 2 seconds.  Neurological:     General: No focal deficit present.     Mental Status: He is  alert and oriented to person, place, and time.     Cranial Nerves: No cranial nerve deficit.     Sensory: No sensory deficit.     Motor: No weakness.  Psychiatric:        Mood and Affect: Mood normal.     ED Results / Procedures / Treatments   Labs (all labs ordered are listed, but only abnormal results are displayed) Labs Reviewed  URINALYSIS, ROUTINE W REFLEX MICROSCOPIC - Abnormal; Notable for the following components:      Result Value   Color, Urine COLORLESS (*)    Specific Gravity, Urine 1.002 (*)    All other components within normal limits  COMPREHENSIVE METABOLIC PANEL  CBC WITH DIFFERENTIAL/PLATELET  MAGNESIUM  TROPONIN I (HIGH SENSITIVITY)  TROPONIN I (HIGH SENSITIVITY)    EKG None  Radiology DG Chest Port 1 View  Result Date: 08/31/2022 CLINICAL DATA:  [id number] Chest pain [id number] EXAM: PORTABLE CHEST - 1 VIEW COMPARISON:  08/20/2022 FINDINGS: Cardiac silhouette is unremarkable. No pneumothorax or pleural effusion. The lungs are clear. The visualized skeletal structures are unremarkable. IMPRESSION: No acute cardiopulmonary process. Electronically Signed   By: Sammie Bench M.D.   On: 08/31/2022 11:05    Procedures Procedures    Medications Ordered in ED Medications  LORazepam (ATIVAN) tablet 0.5 mg (has no administration in time range)    ED Course/ Medical Decision Making/ A&P           HEART Score: 1                Medical Decision Making Patient is a 42 year old male, here for hot and cold spells spells, presyncopal episodes, and feelings of chest tightness.  This has been a chronic issue going on for the last 4 to 5 months, he has been seen 4 times over the past couple months, in the ER for the same issues.  He has had a CT abdomen pelvis, CT head, and CTA head and neck performed, all which were unremarkable except for a cyst on his adrenal gland.  He has a follow-up with an endocrinologist.  His ED has same symptoms today, including chest pain,  dizziness, ears ringing, and cold and hot chills.  He is currently not dizzy at this time.  Complains of slight chest tightness.  Reports that he is under a lot of stress since his wife may leave him as he has been having medical issues.  Has an appointment with a therapist as  well.  Will take obtain basic labs including chest x-ray EKG, electrolytes for further evaluation.  He has no focal deficits on my exam, and a head CT recently was unremarkable, he states his symptoms are unchanged.  He has no abdominal tenderness.  And is overall well-appearing at this time.  Amount and/or Complexity of Data Reviewed Labs: ordered.    Details: Unremarkable Radiology: ordered.    Details: Chest x-ray unremarkable ECG/medicine tests:  Decision-making details documented in ED Course. Discussion of management or test interpretation with external provider(s): Discussed with patient, heart score of 1, he states he thinks he is having a panic attack, and took his hydroxyzine earlier, and is not helpful, we will give him 1 dose of Ativan.  His labs are unremarkable, and he is well-appearing, the symptoms have been chronic and ongoing for the last 4 to 5 months, his blood pressure is elevated, but he states it is only elevated when he is at the doctor.  I discussed that is findings were within normal limits, and encouraged him to follow-up with his PCP, and try to get into his endocrinologist earlier.  Return precautions emphasized.  Risk Prescription drug management.   Final Clinical Impression(s) / ED Diagnoses Final diagnoses:  Anxiety  Chest tightness  Dizziness    Rx / DC Orders ED Discharge Orders     None         Diamantina Monks, Si Gaul, PA 08/31/22 1235    Milton Ferguson, MD 09/03/22 1550

## 2022-09-02 ENCOUNTER — Telehealth: Payer: PRIVATE HEALTH INSURANCE | Admitting: Family Medicine

## 2022-09-02 DIAGNOSIS — M542 Cervicalgia: Secondary | ICD-10-CM | POA: Diagnosis not present

## 2022-09-02 DIAGNOSIS — E559 Vitamin D deficiency, unspecified: Secondary | ICD-10-CM | POA: Diagnosis not present

## 2022-09-02 DIAGNOSIS — F419 Anxiety disorder, unspecified: Secondary | ICD-10-CM | POA: Diagnosis not present

## 2022-09-02 DIAGNOSIS — I10 Essential (primary) hypertension: Secondary | ICD-10-CM | POA: Diagnosis not present

## 2022-09-02 NOTE — Patient Instructions (Signed)
  [name], thank you for joining Perlie Mayo, NP for today's virtual visit.  While this provider is not your primary care provider (PCP), if your PCP is located in our provider database this encounter information will be shared with them immediately following your visit.   [facility] account gives you access to today's visit and all your visits, tests, and labs performed at [facility] " click here if you don't have a [facility] account or go to [email or web address]  Consent: (Patient) Gerald Phillips provided verbal consent for this virtual visit at the beginning of the encounter.  Current Medications:  Current Outpatient Medications:    amLODipine (NORVASC) 10 MG tablet, Take 1 tablet (10 mg total) by mouth daily., Disp: 30 tablet, Rfl: 2   chlorthalidone (HYGROTON) 25 MG tablet, Take 1 tablet (25 mg total) by mouth daily., Disp: 30 tablet, Rfl: 2   cyclobenzaprine (FLEXERIL) 10 MG tablet, Take 1 tablet (10 mg total) by mouth 3 (three) times daily as needed for muscle spasms., Disp: 30 tablet, Rfl: 0   hydrOXYzine (ATARAX) 10 MG tablet, Take 1 tablet (10 mg total) by mouth 3 (three) times daily as needed for anxiety., Disp: 30 tablet, Rfl: 1   LORazepam (ATIVAN) 1 MG tablet, Take 1 tablet (1 mg total) by mouth every 8 (eight) hours as needed for anxiety., Disp: 10 tablet, Rfl: 0   OVER THE COUNTER MEDICATION, Vitamin D3 with K2, Disp: , Rfl:    OVER THE COUNTER MEDICATION, Take 1 tablet by mouth daily. Vitamin b6,folic acid,vitamin d and vitamin b12, Disp: , Rfl:    OVER THE COUNTER MEDICATION, Take 1 tablet by mouth daily. Scription BP, Disp: , Rfl:    Medications ordered in this encounter:  No orders of the defined types were placed in this encounter.    *If you need refills on other medications prior to your next appointment, please contact your pharmacy*  Follow-Up: Call back or seek an in-person evaluation if the symptoms worsen or if  the condition fails to improve as anticipated.  [facility] [phone]  Other Instructions  Follow up with your PCP about your Vitamin D levels  If you have been instructed to have an in-person evaluation today at a local Urgent Care facility, please use the link below. It will take you to a list of all of our available [facility], including address, phone number and hours of operation. Please do not delay care.  [facility]  If you or a family member do not have a primary care provider, use the link below to schedule a visit and establish care. When you choose a [facility] primary care physician or advanced practice provider, you gain a long-term partner in health. Find a Primary Care Provider  Learn more about [facility] in-office and virtual care options: [facility]
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
## 2022-09-03 ENCOUNTER — Emergency Department (HOSPITAL_COMMUNITY): Payer: PRIVATE HEALTH INSURANCE

## 2022-09-03 ENCOUNTER — Other Ambulatory Visit: Payer: Self-pay

## 2022-09-03 ENCOUNTER — Encounter (HOSPITAL_COMMUNITY): Payer: Self-pay | Admitting: Emergency Medicine

## 2022-09-03 ENCOUNTER — Emergency Department (HOSPITAL_COMMUNITY)
Admission: EM | Admit: 2022-09-03 | Discharge: 2022-09-04 | Disposition: A | Discharge status: Home / Self Care | Payer: PRIVATE HEALTH INSURANCE | Attending: Emergency Medicine | Admitting: Emergency Medicine

## 2022-09-03 DIAGNOSIS — R079 Chest pain, unspecified: Secondary | ICD-10-CM | POA: Diagnosis present

## 2022-09-03 DIAGNOSIS — F419 Anxiety disorder, unspecified: Secondary | ICD-10-CM

## 2022-09-03 LAB — COMPREHENSIVE METABOLIC PANEL
ALT: 24 U/L (ref 0–44)
AST: 20 U/L (ref 15–41)
Albumin: 4.4 g/dL (ref 3.5–5.0)
Alkaline Phosphatase: 58 U/L (ref 38–126)
Anion gap: 7 (ref 5–15)
BUN: 10 mg/dL (ref 6–20)
CO2: 25 mmol/L (ref 22–32)
Calcium: 9.2 mg/dL (ref 8.9–10.3)
Chloride: 105 mmol/L (ref 98–111)
Creatinine, Ser: 0.94 mg/dL (ref 0.61–1.24)
GFR, Estimated: >60 mL/min (ref >60)
Glucose, Bld: 93 mg/dL (ref 70–99)
Potassium: 3.5 mmol/L (ref 3.5–5.1)
Sodium: 137 mmol/L (ref 135–145)
Total Bilirubin: 0.3 mg/dL (ref 0.3–1.2)
Total Protein: 7.2 g/dL (ref 6.5–8.1)

## 2022-09-03 LAB — URINALYSIS, ROUTINE W REFLEX MICROSCOPIC
Bilirubin Urine: NEGATIVE
Glucose, UA: NEGATIVE mg/dL
Ketones, ur: NEGATIVE mg/dL
Leukocytes,Ua: NEGATIVE
Nitrite: NEGATIVE
Protein, ur: NEGATIVE mg/dL
Specific Gravity, Urine: 1.009 (ref 1.005–1.030)
pH: 6 (ref 5.0–8.0)

## 2022-09-03 LAB — CBC WITH DIFFERENTIAL/PLATELET
Abs Immature Granulocytes: 0 10*3/uL (ref 0.00–0.07)
Band Neutrophils: 1 %
Basophils Absolute: 0.1 10*3/uL (ref 0.0–0.1)
Basophils Relative: 1 %
Eosinophils Absolute: 0.1 10*3/uL (ref 0.0–0.5)
Eosinophils Relative: 2 %
HCT: 41.4 % (ref 39.0–52.0)
Hemoglobin: 14.4 g/dL (ref 13.0–17.0)
Lymphocytes Relative: 46 %
Lymphs Abs: 2.6 10*3/uL (ref 0.7–4.0)
MCH: 33 pg (ref 26.0–34.0)
MCHC: 34.8 g/dL (ref 30.0–36.0)
MCV: 95 fL (ref 80.0–100.0)
Monocytes Absolute: 0.6 10*3/uL (ref 0.1–1.0)
Monocytes Relative: 10 %
Neutro Abs: 2.3 10*3/uL (ref 1.7–7.7)
Neutrophils Relative %: 40 %
Platelets: 169 10*3/uL (ref 150–400)
RBC: 4.36 MIL/uL (ref 4.22–5.81)
RDW: 12.5 % (ref 11.5–15.5)
WBC: 5.7 10*3/uL (ref 4.0–10.5)
nRBC: 0 % (ref 0.0–0.2)

## 2022-09-03 LAB — MAGNESIUM: Magnesium: 2 mg/dL (ref 1.7–2.4)

## 2022-09-03 LAB — TROPONIN I (HIGH SENSITIVITY)
Troponin I (High Sensitivity): 4 ng/L (ref <18)
Troponin I (High Sensitivity): 5 ng/L (ref <18)

## 2022-09-03 LAB — RAPID URINE DRUG SCREEN, HOSP PERFORMED
Amphetamines: NOT DETECTED
Barbiturates: NOT DETECTED
Benzodiazepines: NOT DETECTED
Cocaine: NOT DETECTED
Opiates: NOT DETECTED
Tetrahydrocannabinol: NOT DETECTED

## 2022-09-03 MED ORDER — LORAZEPAM 1 MG PO TABS
1.0000 mg | ORAL_TABLET | Freq: Once | ORAL | Status: AC
  Administered 2022-09-03: 1 mg via ORAL
  Filled 2022-09-03: qty 1

## 2022-09-03 NOTE — ED Notes (Signed)
Pt to XR

## 2022-09-03 NOTE — ED Provider Notes (Signed)
[facility] Provider Note   CSN: [id number] Arrival date & time: 09/03/22  1927     History Chief Complaint  Patient presents with   Tremors    Gerald Phillips is a 41 y.o. male with history of anxiety and hypertension who presents to the emergency department with chest pain that has been off and on for the past 4 months. Chest pain feels like it has in the past. He reports he has already had an ECHO that was normal. He reports that whenever his chest pain comes, he "feels like his life is in danger". He still complains of anxiety and headaches. Today he reports he started to have full body twitching that he has never had before.  He is awake and is able to talk through the twitching.  No postictal state.  He denies any SOB, but reports he has had palpitations for the past few months as well too.  He reports that he has been referred to an endocrinologist, ear nose and throat doctor, and psych.  He is unsure if he is been referred to a neurologist for as well.  He has been seen multiple times over the past 2 months for similar presentation.  He reports that the Ativan does make him feel better but his primary care doctor would not send in a prescription for this.  He has had multiple head CTs and reassuring evaluations in the past.  He does not have any cardiac disease aside from hypertension but reports he does take his medication as prescribed.  Reports tobacco use but denies any EtOH, or illicit drug use. Denies any SI or HI.   HPI     Home Medications Prior to Admission medications   Medication Sig Start Date End Date Taking? Authorizing Provider  amLODipine (NORVASC) 10 MG tablet Take 1 tablet (10 mg total) by mouth daily. 08/26/22  Yes Del Ria Comment, Lamar Benes, FNP  cyclobenzaprine (FLEXERIL) 10 MG tablet Take 1 tablet (10 mg total) by mouth 3 (three) times daily as needed for muscle spasms. 08/21/22  Yes Tempie Hoist, FNP  hydrOXYzine  (ATARAX) 10 MG tablet Take 1 tablet (10 mg total) by mouth 3 (three) times daily as needed for anxiety. 08/26/22  Yes Del Ria Comment, Iliana, FNP  KRILL OIL PO Take by mouth.   Yes [provider]  LORazepam (ATIVAN) 1 MG tablet Take 1 tablet (1 mg total) by mouth daily. 09/03/22  Yes Sherrell Puller, PA-C  Magnesium 400 MG CAPS Take 1 capsule by mouth daily.   Yes [provider]  OVER THE COUNTER MEDICATION Vitamin D3 with K2   Yes [provider]  OVER THE COUNTER MEDICATION Take 1 tablet by mouth daily. Vitamin b6,folic acid,vitamin d and vitamin b12   Yes [provider]  OVER THE COUNTER MEDICATION Take 1 tablet by mouth daily. Scription BP   Yes [provider]  chlorthalidone (HYGROTON) 25 MG tablet Take 1 tablet (25 mg total) by mouth daily. Patient not taking: Reported on 09/03/2022 07/25/22   Carlisle Cater, PA-C      Allergies    Penicillins and Penicillins    Review of Systems   Review of Systems  Constitutional:  Negative for chills and fever.  HENT:  Negative for congestion and rhinorrhea.   Respiratory:  Negative for cough and shortness of breath.   Cardiovascular:  Positive for chest pain and palpitations.  Gastrointestinal:  Negative for abdominal pain, constipation,  diarrhea, nausea and vomiting.  Genitourinary:  Negative for dysuria and hematuria.  Musculoskeletal:        Reports twitching  Neurological:  Positive for headaches. Negative for syncope and speech difficulty.  Psychiatric/Behavioral:  The patient is nervous/anxious.     Physical Exam Updated Vital Signs BP 131/85   Pulse (!) 59   Temp 98.8 F (37.1 C) (Oral)   Resp 17   Ht '5\' 11"'$  (1.803 m)   Wt 99.8 kg   SpO2 97%   BMI 30.68 kg/m  Physical Exam Constitutional:      Appearance: He is not toxic-appearing.     Comments: Anxious appearing  HENT:     Head: Normocephalic and atraumatic.     Mouth/Throat:     Mouth: Mucous membranes are moist.  Eyes:      General: No scleral icterus.    Extraocular Movements: Extraocular movements intact.     Pupils: Pupils are equal, round, and reactive to light.  Cardiovascular:     Rate and Rhythm: Normal rate.  Pulmonary:     Effort: Pulmonary effort is normal. No respiratory distress.     Breath sounds: Normal breath sounds.  Abdominal:     Palpations: Abdomen is soft.     Tenderness: There is no abdominal tenderness. There is no guarding or rebound.  Musculoskeletal:     Cervical back: Normal range of motion. No rigidity or tenderness.  Skin:    General: Skin is warm and dry.  Neurological:     General: No focal deficit present.     Mental Status: He is alert.     GCS: GCS eye subscore is 4. GCS verbal subscore is 5. GCS motor subscore is 6.     Cranial Nerves: No cranial nerve deficit, dysarthria or facial asymmetry.     Sensory: No sensory deficit.     Motor: No weakness.     Comments: Patient is alert.  GCS 15.  Cranial nerves II through XII intact.  No facial asymmetry noted.  Patient is answering questions appropriately with appropriate speech.  Sensation intact throughout.  Strength is equal in patient's upper and lower bilateral extremities.  No pronator drift.  These twitches are not timing consistent.  They are almost like a full body shiver that last around 1 second but patient is able to talk during them.     ED Results / Procedures / Treatments   Labs (all labs ordered are listed, but only abnormal results are displayed) Labs Reviewed  URINALYSIS, ROUTINE W REFLEX MICROSCOPIC - Abnormal; Notable for the following components:      Result Value   Color, Urine STRAW (*)    Hgb urine dipstick SMALL (*)    Bacteria, UA RARE (*)    All other components within normal limits  URINE CULTURE  MAGNESIUM  COMPREHENSIVE METABOLIC PANEL  CBC WITH DIFFERENTIAL/PLATELET  RAPID URINE DRUG SCREEN, HOSP PERFORMED  TROPONIN I (HIGH SENSITIVITY)  TROPONIN I (HIGH SENSITIVITY)    EKG EKG  Interpretation  Date/Time:  Tuesday September 03 2022 20:50:25 EST Ventricular Rate:  71 PR Interval:  157 QRS Duration: 84 QT Interval:  379 QTC Calculation: 412 R Axis:   -7 Text Interpretation: Sinus rhythm Low voltage, precordial leads Borderline T abnormalities, inferior leads Baseline wander in lead(s) V1 Confirmed by Noemi Chapel [phone]) on 09/04/2022 8:30:44 AM  Radiology DG Chest 2 View  Result Date: 09/03/2022 CLINICAL DATA:  Chest pain, tremors, and dizziness for several months. EXAM: CHEST -  2 VIEW COMPARISON:  08/31/2022. FINDINGS: The heart size and mediastinal contours are within normal limits. No consolidation, effusion, or pneumothorax. Degenerative changes are present in the thoracic spine. No acute osseous abnormality. IMPRESSION: No active cardiopulmonary disease. Electronically Signed   By: Brett Fairy M.D.   On: 09/03/2022 21:09    Procedures Procedures   Medications Ordered in ED Medications  LORazepam (ATIVAN) tablet 1 mg (1 mg Oral Given 09/03/22 2050)    ED Course/ Medical Decision Making/ A&P                          Medical Decision Making Amount and/or Complexity of Data Reviewed Labs: ordered. Radiology: ordered.  Risk Prescription drug management.   42 y.o. male presents to the ER for evaluation of full body twitching as well as his chest pain. Differential diagnosis includes but is not limited to ACS, pericarditis, myocarditis, aortic dissection, PE, pneumothorax, esophageal spasm or rupture, chronic angina, pneumonia, bronchitis, GERD, reflux/PUD, biliary disease, pancreatitis, costochondritis, anxiety . Vital signs show initial tachycardia and hypertension that has improved. Physical exam as noted above.   The patient has been seen on 2/24, 2/13, 1/24, 1/18, 1/05 with similar presentations.   I independently reviewed and interpreted the patient's labs.  CBC without leukocytosis or anemia.  CMP without electrolyte or LFT abnormality.  Magnesium  within normal limits.  Urine shows dilute urine with rare bacteria but no white blood cells nitrates or leukocytes present.  There is some white blood cell clumps present.  I did call lab about this, she thinks it may have been a machine error.  Patient does not have any symptoms, but will add on a urine culture.  UDS is negative.  Troponin initial of 4 with repeat of 5.  Around patient's previous baseline.  EKG read and interpreted as Sinus rhythm, Low voltage, precordial leads Borderline T abnormalities, inferior leads, Baseline wander in lead(s) V1.  CXR is unremarkable.   On re-evaluation, the patient is feeling much better after the ativan. He reports that his chest pain had resolved.   After consideration of the diagnostic results and the patients response to treatment, I feel that the patient is safe for discharge home with follow up with his PCP and specialists.  . Emergency department workup does not suggest an emergent condition requiring admission or immediate intervention beyond what has been performed at this time.  Low suspicion for any dissection or aneurysm. Patient was initially tachycardic to 101 but was extremely anxious, he improved upon calming down. Otherwise low Wells criteria. Doubt any PE. He has had extensive workup for this in the past. I likely think this is his anxiety. For the twitching, I spent at least 5-10 minutes in the room reviewing the lab and imaging results as well as the plan and did not see a shiver/tremor. I likely think this is some component to his anxiety. I doubt any seizures as he is able to talk through them and does not have any post ictal state.   We discussed the results of the labs/imaging. The plan is follow up with psych as well as his PCP and other specialist. I was able to send him home with a few ativan to help with his anxiety. We discussed strict return precautions and red flag symptoms. The patient verbalized their understanding and agrees to the  plan. The patient is stable and being discharged home in good condition.  I discussed this case with  my attending physician who cosigned this note including patient's presenting symptoms, physical exam, and planned diagnostics and interventions. Attending physician stated agreement with plan or made changes to plan which were implemented.   Portions of this report may have been transcribed using voice recognition software. Every effort was made to ensure accuracy; however, inadvertent computerized transcription errors may be present.   Final Clinical Impression(s) / ED Diagnoses Final diagnoses:  Anxiety  Nonspecific chest pain    Rx / DC Orders ED Discharge Orders          Ordered    LORazepam (ATIVAN) 1 MG tablet  Daily        09/04/22 0000              Sherrell Puller, PA-C 09/04/22 1627    Sherwood Gambler, MD 09/06/22 1109

## 2022-09-03 NOTE — ED Notes (Signed)
Lab in room.

## 2022-09-03 NOTE — ED Triage Notes (Signed)
Pt c/o tremors started today.

## 2022-09-03 NOTE — Discharge Instructions (Addendum)
You were seen in the ER today for evaluation of your chest pain and anxiety.  I am glad you are feeling better.  I encouraged you to follow-up with your psychiatrist that you are seeing.  Additionally, recommend following up with your primary care doctor again expressing to her your anxiety and symptoms, including the muscle jerking.  I am referring you to a neurologist for you to follow-up for your headaches that you have been having.  Please make sure you are also follow-up with endocrinologist as well. I did send you in a few ativan. Please take one daily. If you have any concerns, new or worsening symptoms, please return to the nearest emergency department for evaluation.  [facility] Primary Care Doctor List Tula Nakayama, MD. Specialty: [facility] Medicine Contact information: [address] [place] [address]  [phone]   Sallee Lange, MD. Specialty: [facility] Medicine Contact information: [place]  [place] [address]  [phone]   Rosita Fire, MD Specialty: Internal Medicine Contact information: [place] [address]  [phone]   Delphina Cahill, MD. Specialty: Internal Medicine Contact information: [place] [address]  [phone]    [facility] (Dr. Maudie Mercury) Specialty: Family Medicine Contact information: [place] [address]  [phone]   Leslie Andrea, MD. Specialty: [facility] Medicine Contact information: [place] [address]  [phone]   Asencion Noble, MD. Specialty: Internal Medicine Contact information: [address]  [place] [address]  [place]  [address] [place], [place] [address] [phone]  Services [facility] offers a variety of basic health services.  Services include but are not limited to: Blood pressure  checks  Heart rate checks  Blood sugar checks  Urine analysis  Rapid strep tests  Pregnancy tests.  Health education and referrals  People needing more complex services will be directed to a physician online. Using these virtual visits, doctors can evaluate and prescribe medicine and treatments. There will be no medication on-site, though [facility] will help patients fill their prescriptions at little to no cost.   For More information please go to: [email or web address]

## 2022-09-04 ENCOUNTER — Telehealth: Payer: Self-pay | Admitting: Family Medicine

## 2022-09-04 ENCOUNTER — Ambulatory Visit (HOSPITAL_COMMUNITY)
Admission: EM | Admit: 2022-09-04 | Discharge: 2022-09-04 | Disposition: A | Discharge status: Home / Self Care | Payer: PRIVATE HEALTH INSURANCE

## 2022-09-04 DIAGNOSIS — F419 Anxiety disorder, unspecified: Secondary | ICD-10-CM

## 2022-09-04 MED ORDER — LORAZEPAM 1 MG PO TABS: 1.0000 mg | ORAL_TABLET | Freq: Every day | ORAL | 0 refills | Status: AC
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
## 2022-09-04 NOTE — ED Provider Notes (Signed)
Behavioral Health Urgent Care Medical Screening Exam  Patient Name: Gerald Phillips MRN: [id number] Date of Evaluation: 09/04/22 Chief Complaint: "Can she tell you?" Diagnosis:  Final diagnoses:  Anxiety disorder, unspecified type   History of Present illness: Gerald Phillips is a 42 y.o. male. Pt presents voluntarily to [facility] behavioral health for walk-in assessment.  Pt is accompanied by his mother, Gerald Phillips, who remains with pt throughout the assessment, as per pt verbal consent/request. Pt is assessed face-to-face by nurse practitioner.   Gerald Phillips, 42 y.o., male patient seen face to face by this provider; and chart reviewed on 09/04/22. Per chart review, pt with medical history of hypertension, gout, gerd; psychiatric history of anxiety.  On evaluation, when asked reason for presenting today, Gerald Phillips asks "Can she [mother] tell you?"   Per Gerald Phillips, for the past 4 to 5 months, pt has been "dizzy, headache, chest pain, palpitations". She states pt has been to the emergency department for evaluation about 10 to 12 times. She states he has been medically cleared each time. Per pt, only thing of note has been a possible cyst on his adrenal gland. Reviewed chart and on 07/31/22 CT Angio Chest with Contrast done. Findings indicated: No PE, aneurysm or dissection. 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   Pt reports anxious mood. He reports feeling like he is in "fight or flight" mode constantly. When asked about appetite, he reports he "can eat" although does skip meals. He reports sleep is not good. He denies suicidal, homicidal or violent ideations. He denies auditory visual hallucinations or paranoia. He denies non suicidal self injurious behavior, suicide attempt or inpatient psychiatric hospitalization. He denies alcohol, marijuana, crack/cocaine, nicotine,  methamphetamine, opioid use.   Discussed possibility that symptoms may be related to lesion noted on left adrenal gland per imaging available from 07/31/22. Discussed follow up with pcp and/or endocrinology. Pt states he is trying to talk to his pcp to get endocrinology referral. He reports he is taking zoloft '25mg'$ , prescribed by a doctor in [place], who he is considering transferring primary care services to. He states he is supposed to start counseling on 09/19/22 at [facility] in [place]. He agrees to follow up with pcp about referral to endocrinology, to follow up with outpatient psychiatry and counseling.   [place] ED from 09/04/2022 in [facility] ED from 09/03/2022 in [facility] Emergency Department at [facility] ED from 08/31/2022 in [facility] Emergency Department at [place] No Risk No Risk No Risk       Psychiatric Specialty Exam  Presentation  General Appearance:Appropriate for Environment; Casual; Fairly Groomed  Eye Contact:Fair  Speech:Clear and Coherent; Normal Rate  Speech Volume:Normal  Handedness:Right   Mood and Affect  Mood:Anxious  Affect:Blunt   Thought Process  Thought Processes:Coherent; Goal Directed; Linear  Descriptions of Associations:Intact  Orientation:Full (Time, Place and Person)  Thought Content:Logical  Diagnosis of Schizophrenia or Schizoaffective disorder in past: No   Hallucinations:None  Ideas of Reference:None  Suicidal Thoughts:No  Homicidal Thoughts:No   Sensorium  Memory:Immediate Good  Judgment:Good  Insight:Good   Executive Functions  Concentration:Good  Attention Span:Good  [place] of Knowledge:Good  Language:Good   Psychomotor Activity  Psychomotor Activity:Normal   Assets  Assets:Communication Skills; Desire for Improvement; Financial Resources/Insurance; Housing; Resilience; Social Support;  Vocational/Educational; Intimacy; Talents/Skills  Sleep  Sleep:Poor  Number of hours: No data recorded  Physical Exam: Physical Exam Constitutional:      General: He is not in acute distress.    Appearance: He is not ill-appearing, toxic-appearing or diaphoretic.  Eyes:     General: No scleral icterus. Cardiovascular:     Rate and Rhythm: Normal rate.  Pulmonary:     Effort: Pulmonary effort is normal. No respiratory distress.  Skin:    General: Skin is warm and dry.  Neurological:     Mental Status: He is alert and oriented to person, place, and time.  Psychiatric:        Attention and Perception: Attention and perception normal.        Mood and Affect: Mood is anxious. Affect is blunt.        Speech: Speech normal.        Behavior: Behavior normal. Behavior is cooperative.        Thought Content: Thought content normal.        Cognition and Memory: Cognition and memory normal.        Judgment: Judgment normal.    Review of Systems  Constitutional:  Negative for chills and fever.  Respiratory:  Negative for shortness of breath.   Cardiovascular:  Positive for palpitations. Negative for chest pain.  Gastrointestinal:  Negative for abdominal pain.  Neurological:  Positive for dizziness and tremors.  Psychiatric/Behavioral:  The patient is nervous/anxious.    Blood pressure (!) 147/117, pulse 83, temperature 99 F (37.2 C), temperature source Oral, resp. rate 19, SpO2 100 %. There is no height or weight on file to calculate BMI.  Musculoskeletal: Strength & Muscle Tone: within normal limits Gait & Station: normal Patient leans: N/A   [place] MSE Discharge Disposition for Follow up and Recommendations: Based on my evaluation the patient does not appear to have an emergency medical condition and can be discharged with resources and follow up care in outpatient services   Tharon Aquas, NP 09/04/2022, 1:24 PM

## 2022-09-04 NOTE — ED Notes (Signed)
Patient discharged to home with written and verbal instructions by Elvin So, NP

## 2022-09-04 NOTE — Discharge Instructions (Addendum)
Patient is instructed prior to discharge to: Take all medications as prescribed by his/her mental healthcare provider. Report any adverse effects and or reactions from the medicines to his/her outpatient provider promptly. Keep all scheduled appointments, to ensure that you are getting refills on time and to avoid any interruption in your medication.  If you are unable to keep an appointment call to reschedule.  Be sure to follow-up with resources and follow-up appointments provided.  Patient has been instructed & cautioned: To not engage in alcohol and or illegal drug use while on prescription medicines. In the event of worsening symptoms, patient is instructed to call the crisis hotline, 911 and or go to the nearest ED for appropriate evaluation and treatment of symptoms. To follow-up with his/her primary care provider for your other medical issues, concerns and or health care needs.  Information: -National Suicide Prevention Lifeline 1-800-SUICIDE or 951-807-7258.  -988 offers 24/7 access to trained crisis counselors who can help people experiencing mental health-related distress. People can call or text 988 or chat 988lifeline.org for themselves or if they are worried about a loved one who may need crisis support.

## 2022-09-04 NOTE — ED Notes (Signed)
Pt feeling better. Went over [employer]. All questions answered.

## 2022-09-04 NOTE — Telephone Encounter (Signed)
Pt wants to know if referral can be sent to [facility] in [place] [facility]? He is not able to wait till April to be seen in [place]. Was in ER again last night for this issue. Can you please send referrals?    hydrOXYzine (ATARAX) 10 MG tablet [ Is causing tremors & chest pains, making him feel faint. Wants to know what to do?

## 2022-09-05 ENCOUNTER — Other Ambulatory Visit: Payer: Self-pay | Admitting: Internal Medicine

## 2022-09-05 ENCOUNTER — Telehealth: Payer: Self-pay

## 2022-09-05 DIAGNOSIS — E278 Other specified disorders of adrenal gland: Secondary | ICD-10-CM | POA: Diagnosis not present

## 2022-09-05 LAB — URINE CULTURE: Culture: NO GROWTH

## 2022-09-05 NOTE — Transitions of Care (Post Inpatient/ED Visit) (Signed)
   09/05/2022  Name: Gerald Phillips MRN: [id number] DOB: 18-Jan-1981  Today's TOC FU Call Status: Today's TOC FU Call Status:: Successful TOC FU Call Competed TOC FU Call Complete Date: 09/05/22  Transition Care Management Follow-up Telephone Call Date of Discharge: 09/04/22 Dis[facility] Type of Discharge: Emergency Department Reason for ED Visit: Mental or Mood Disorder Mental or Mood Disorder Diagnosis: Anxiety How have you been since you were released from the hospital?: Same Any questions or concerns?: Yes Patient Questions/Concerns:: patient wants referral to Endocrinology  Items Reviewed: Did you receive and understand the discharge instructions provided?: Yes Medications obtained and verified?: Yes (Medications Reviewed) Any new allergies since your discharge?: No Dietary orders reviewed?: Yes Do you have support at home?: Yes  Home Care and Equipment/Supplies: [place] Ordered?: NA Any new equipment or medical supplies ordered?: NA  Functional Questionnaire: Do you need assistance with bathing/showering or dressing?: No Do you need assistance with meal preparation?: No Do you need assistance with eating?: No Do you have difficulty maintaining continence: No Do you need assistance with getting out of bed/getting out of a chair/moving?: No Do you have difficulty managing or taking your medications?: No  Folllow up appointments reviewed: PCP Follow-up appointment confirmed?: No (refused) MD Provider Line Number:[phone] Given: Yes [facility] Follow-up appointment confirmed?: NA Do you need transportation to your follow-up appointment?: No Do you understand care options if your condition(s) worsen?: Yes-patient verbalized understanding    [place] LPN [place] Direct Dial [phone]

## 2022-09-11 LAB — TIQ-NTM

## 2022-09-11 LAB — EXTRA LAV TOP TUBE

## 2022-09-11 LAB — CORTISOL: Cortisol, Plasma: 4.5 ug/dL

## 2022-09-13 LAB — TIQ-MISC

## 2022-09-19 ENCOUNTER — Ambulatory Visit (INDEPENDENT_AMBULATORY_CARE_PROVIDER_SITE_OTHER): Payer: PRIVATE HEALTH INSURANCE | Admitting: Clinical

## 2022-09-19 DIAGNOSIS — F411 Generalized anxiety disorder: Secondary | ICD-10-CM

## 2022-09-19 DIAGNOSIS — F4310 Post-traumatic stress disorder, unspecified: Secondary | ICD-10-CM
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
## 2022-09-26 ENCOUNTER — Other Ambulatory Visit: Payer: Self-pay | Admitting: Family Medicine

## 2022-10-01 ENCOUNTER — Encounter: Payer: Self-pay | Admitting: "Endocrinology

## 2022-10-01 LAB — CATECHOLAMINES, FRACTIONATED, PLASMA

## 2022-10-01 LAB — METANEPHRINES, PLASMA
Metanephrine, Free: 33 pg/mL (ref <=57)
Normetanephrine, Free: 117 pg/mL (ref <=148)
Total Metanephrines-Plasma: 150 pg/mL (ref <=205)

## 2022-10-03 ENCOUNTER — Ambulatory Visit (INDEPENDENT_AMBULATORY_CARE_PROVIDER_SITE_OTHER): Payer: PRIVATE HEALTH INSURANCE | Admitting: Clinical

## 2022-10-03 DIAGNOSIS — F4310 Post-traumatic stress disorder, unspecified: Secondary | ICD-10-CM

## 2022-10-03 DIAGNOSIS — F411 Generalized anxiety disorder: Secondary | ICD-10-CM | POA: Diagnosis not present

## 2022-10-03 NOTE — Progress Notes (Signed)
IN PERSON   I connected with Gerald Phillips on 10/03/22 at 10:00 AM EDT in person and verified that I am speaking with the correct person using two identifiers.   Location: Patient: Office Provider: Office   I discussed the limitations of evaluation and management by telemedicine and the availability of in person appointments. The patient expressed understanding and agreed to proceed. ( IN PERSON)        THERAPIST PROGRESS NOTE   Session Time: 10:00 AM-10:30 AM   Participation Level: Active   Behavioral Response: CasualAlertAnxious   Type of Therapy: Individual Therapy   Treatment Goals addressed: Coping Anger Management for PTSD/GAD   Interventions: CBT, Motivational Interviewing, Solution Focused and Supportive   Summary: Gerald Kapple. Fels is a 42 y.o. male who presents with GAD and PTSD. The OPT therapist worked with the patient for his scheduled session. The OPT therapist utilized Motivational Interviewing to assist in creating therapeutic repore. The patient spoke about his wife being home from  [place] post her recently to visit with her sister. The patient spoke about a lot of positive change since his last session including his medication working well currently for him, more dedication to his music/faith/and journaling. The patient spoke about his ongoing involvement with his church and getting back into exercising . The OPT therapist utilized Cognitive Behavioral Therapy through cognitive restructuring as well as worked with the patient on  leveraging coping strategies to assist in management of PTSD/GAD. The patient continues to utlize his existing support network going out on a regular basis with his sister in the community. The patient spoke on his progressive work towards his current goal of being able to go into stores in the community shop and leave without having to cut it short due to his Anxiety..The OPT therapist over-viewed with the patient upcoming appointments as  listed in his [place].    Suicidal/Homicidal: Nowithout intent/plan   Therapist Response: The OPT therapist worked with the patient for the patients scheduled session. The patient was engaged in his session and gave feedback in relation to triggers, symptoms, and behavior responses over the past few weeks. The OPT therapist worked with the patient utilizing an in session Cognitive Behavioral Therapy exercise. The patient was responsive in the session and verbalized," Things are better things have been going really good my medication is working for one thing and I did some spring cleaning and that helped and I have just been more active and social," The OPT therapist worked with the patient overviewing his support symptoms and  implementation of coping strategies including leveraging seasonal coping strategies connected to hobbies of cooking and fishing. The OPT therapist worked with the patient overviewing appointments listed in the patients [place].  The OPT therapist will continue treatment work with the patient in his next scheduled session.     Plan: Return again in 2/3 weeks.   Diagnosis:      Axis I: GAD /PTSD                           Axis II: No diagnosis     Collaboration of Care: No additional collaboration of care.    Patient/Guardian was advised Release of Information must be obtained prior to any record release in order to collaborate their care with an outside provider. Patient/Guardian was advised if they have not already done so to contact the registration department to sign all necessary forms in order for [place] to release  information regarding their care.    Consent: Patient/Guardian gives verbal consent for treatment and assignment of benefits for services provided during this visit. Patient/Guardian expressed understanding and agreed to proceed.    I discussed the assessment and treatment plan with the patient. The patient was provided an opportunity to ask questions and all were  answered. The patient agreed with the plan and demonstrated an understanding of the instructions.   The patient was advised to call back or seek an in-person evaluation if the symptoms worsen or if the condition fails to improve as anticipated.   I provided 30 minutes of face-to-face time during this encounter.   Lennox Grumbles, LCSW    10/03/2022

## 2022-10-15 ENCOUNTER — Encounter (HOSPITAL_COMMUNITY): Payer: Self-pay

## 2022-10-15 ENCOUNTER — Emergency Department (HOSPITAL_COMMUNITY)
Admission: EM | Admit: 2022-10-15 | Discharge: 2022-10-15 | Disposition: A | Discharge status: Home / Self Care | Payer: PRIVATE HEALTH INSURANCE | Attending: Emergency Medicine | Admitting: Emergency Medicine

## 2022-10-15 ENCOUNTER — Emergency Department (HOSPITAL_COMMUNITY): Payer: PRIVATE HEALTH INSURANCE

## 2022-10-15 ENCOUNTER — Other Ambulatory Visit: Payer: Self-pay

## 2022-10-15 DIAGNOSIS — R079 Chest pain, unspecified: Secondary | ICD-10-CM

## 2022-10-15 DIAGNOSIS — I10 Essential (primary) hypertension: Secondary | ICD-10-CM | POA: Diagnosis not present

## 2022-10-15 DIAGNOSIS — R0789 Other chest pain: Secondary | ICD-10-CM | POA: Insufficient documentation

## 2022-10-15 DIAGNOSIS — Z79899 Other long term (current) drug therapy: Secondary | ICD-10-CM | POA: Insufficient documentation

## 2022-10-15 LAB — TROPONIN I (HIGH SENSITIVITY)
Troponin I (High Sensitivity): 4 ng/L (ref <18)
Troponin I (High Sensitivity): 4 ng/L (ref <18)

## 2022-10-15 LAB — BASIC METABOLIC PANEL
Anion gap: 9 (ref 5–15)
BUN: 9 mg/dL (ref 6–20)
CO2: 28 mmol/L (ref 22–32)
Calcium: 9.5 mg/dL (ref 8.9–10.3)
Chloride: 100 mmol/L (ref 98–111)
Creatinine, Ser: 1.05 mg/dL (ref 0.61–1.24)
GFR, Estimated: >60 mL/min (ref >60)
Glucose, Bld: 98 mg/dL (ref 70–99)
Potassium: 3.3 mmol/L — ABNORMAL LOW (ref 3.5–5.1)
Sodium: 137 mmol/L (ref 135–145)

## 2022-10-15 LAB — CBC
HCT: 48.7 % (ref 39.0–52.0)
Hemoglobin: 16.6 g/dL (ref 13.0–17.0)
MCH: 33.1 pg (ref 26.0–34.0)
MCHC: 34.1 g/dL (ref 30.0–36.0)
MCV: 97 fL (ref 80.0–100.0)
Platelets: 181 10*3/uL (ref 150–400)
RBC: 5.02 MIL/uL (ref 4.22–5.81)
RDW: 12.8 % (ref 11.5–15.5)
WBC: 5.6 10*3/uL (ref 4.0–10.5)
nRBC: 0 % (ref 0.0–0.2)

## 2022-10-15 NOTE — Discharge Instructions (Signed)
Evaluation for chest pain was overall reassuring.  EKG and cardiac enzymes were within normal limits.  Recommend you follow-up with your PCP for ongoing chest pain and shoulder pain.  If you have worsening chest pain, shortness of breath, calf tenderness or any other concerning symptom please return emergency department for evaluation.

## 2022-10-15 NOTE — ED Triage Notes (Signed)
Patient reports chest pain that started this morning.  Reports different than his anxiety chest pain.  Reports worse with eating and it radiates into left shoulder.  Denies n/v.  Worse with taking a deep breath.

## 2022-10-15 NOTE — ED Provider Notes (Signed)
[employer] EMERGENCY DEPARTMENT AT [facility] Provider Note   CSN: [id number] Arrival date & time: 10/15/22  1516     History  Chief Complaint  Patient presents with   Chest Pain   HPI DECKLIN POINTER is a 42 y.o. male with hypertension and anxiety presenting for chest pain.  Started this morning.  Located in the left lower chest and feels sharp.  At times radiates to his left arm.  States his chest pain has been intermittent since this morning but seems to be getting more intense.  It is nonreproducible and nonexertional.  Denies shortness of breath. States his left arm has been hurting for the last 3 to 4 days.  Primarily in the upper left arm near the shoulder.  States he just started working out again last week.  States he can get chest pain with his anxiety at times but this "feels different".  Denies calf tenderness, recent immobilization, and history of malignancy.   Chest Pain      Home Medications Prior to Admission medications   Medication Sig Start Date End Date Taking? Authorizing Provider  amLODipine (NORVASC) 10 MG tablet Take 1 tablet (10 mg total) by mouth daily. 08/26/22   Del Nigel Berthold, FNP  chlorthalidone (HYGROTON) 25 MG tablet Take 1 tablet (25 mg total) by mouth daily. Patient not taking: Reported on 09/05/2022 07/25/22   Renne Crigler, PA-C  cyclobenzaprine (FLEXERIL) 10 MG tablet Take 1 tablet (10 mg total) by mouth 3 (three) times daily as needed for muscle spasms. 08/21/22   Delorse Lek, FNP  hydrOXYzine (ATARAX) 10 MG tablet Take 1 tablet (10 mg total) by mouth 3 (three) times daily as needed for anxiety. Patient not taking: Reported on 09/05/2022 08/26/22   Del Nigel Berthold, FNP  KRILL OIL PO Take by mouth.    [provider]  LORazepam (ATIVAN) 1 MG tablet Take 1 tablet (1 mg total) by mouth daily. 09/03/22   Achille Rich, PA-C  Magnesium 400 MG CAPS Take 1 capsule by mouth daily.    [provider]  OVER THE  COUNTER MEDICATION Vitamin D3 with K2    [provider]  OVER THE COUNTER MEDICATION Take 1 tablet by mouth daily. Vitamin b6,folic acid,vitamin d and vitamin b12    [provider]  OVER THE COUNTER MEDICATION Take 1 tablet by mouth daily. Scription BP    [provider]      Allergies    Penicillins and Penicillins    Review of Systems   Review of Systems  Cardiovascular:  Positive for chest pain.    Physical Exam   Vitals:   10/15/22 1537  BP: (!) 148/102  Pulse: 73  Resp: 20  Temp: 98.2 F (36.8 C)  SpO2: 100%    CONSTITUTIONAL:  well-appearing, NAD NEURO:  Alert and oriented x 3, CN 3-12 grossly intact EYES:  eyes equal and reactive ENT/NECK:  Supple, no stridor  CARDIO:  regular rate and rhythm, appears well-perfused  PULM:  No respiratory distress, CTAB GI/GU:  non-distended, soft MSK/SPINE:  No gross deformities, no edema, moves all extremities  SKIN:  no rash, atraumatic  *Additional and/or pertinent findings included in MDM below  ED Results / Procedures / Treatments   Labs (all labs ordered are listed, but only abnormal results are displayed) Labs Reviewed  BASIC METABOLIC PANEL - Abnormal; Notable for the following components:      Result Value   Potassium 3.3 (*)  All other components within normal limits  CBC  TROPONIN I (HIGH SENSITIVITY)  TROPONIN I (HIGH SENSITIVITY)    EKG Normal sinus rhythm  Radiology DG Chest 2 View  Result Date: 10/15/2022 CLINICAL DATA:  Chest pain EXAM: CHEST - 2 VIEW COMPARISON:  Chest radiograph 09/03/2022 FINDINGS: No pleural effusion. No pneumothorax. No focal airspace opacity. Normal cardiac and mediastinal contours. No radiographically apparent displaced rib fractures. Visualized upper abdomen is unremarkable. Vertebral body heights are maintained. IMPRESSION: No focal airspace opacity. Electronically Signed   By: Lorenza Cambridge M.D.   On: 10/15/2022 16:08    Procedures Procedures     Medications Ordered in ED Medications - No data to display  ED Course/ Medical Decision Making/ A&P                             Medical Decision Making Amount and/or Complexity of Data Reviewed Labs: ordered. Radiology: ordered.   42 year old male who is well-appearing presenting for chest pain.  Exam is unremarkable.  DDx includes ACS, PE, pneumonia, CHF exacerbation, pneumothorax.  I personally reviewed and interpreted x-ray which did not reveal any acute cardiopulmonary process.  Labs did not reveal any acute derangement.  Serial troponins were 4.  Overall ACS workup was unremarkable.  Considered PE but unlikely given patient's is PERC negative and chest pain is nonpleuritic.  Symptoms are consistent with CHF and pneumonia.  Symptoms could be related to the fact that he started working out.  Could have sustained a recent soft tissue injury to his chest and shoulder.  Advised to follow-up with his PCP.  Discharged with stable vitals.  Discussed return precautions.        Final Clinical Impression(s) / ED Diagnoses Final diagnoses:  Chest pain, unspecified type    Rx / DC Orders ED Discharge Orders     None         Gareth Eagle, PA-C 10/15/22 1903    Gloris [place], MD 10/16/22 [phone]

## 2022-10-16 ENCOUNTER — Ambulatory Visit (INDEPENDENT_AMBULATORY_CARE_PROVIDER_SITE_OTHER): Payer: Medicaid Other | Admitting: Family Medicine

## 2022-10-16 ENCOUNTER — Telehealth: Payer: Self-pay

## 2022-10-16 ENCOUNTER — Encounter: Payer: Self-pay | Admitting: Family Medicine

## 2022-10-16 VITALS — BP 138/98 | HR 76 | Ht 71.0 in | Wt 223.0 lb

## 2022-10-16 DIAGNOSIS — R12 Heartburn: Secondary | ICD-10-CM | POA: Diagnosis not present

## 2022-10-16 DIAGNOSIS — R0789 Other chest pain: Secondary | ICD-10-CM | POA: Insufficient documentation

## 2022-10-16 DIAGNOSIS — R079 Chest pain, unspecified: Secondary | ICD-10-CM

## 2022-10-16 MED ORDER — PANTOPRAZOLE SODIUM 20 MG PO TBEC: 20.0000 mg | DELAYED_RELEASE_TABLET | Freq: Every day | ORAL | 1 refills | Status: DC

## 2022-10-16 MED ORDER — NITROGLYCERIN 0.4 MG SL SUBL: 0.4000 mg | SUBLINGUAL_TABLET | SUBLINGUAL | 3 refills | Status: AC | PRN

## 2022-10-16 NOTE — Progress Notes (Signed)
Patient Office Visit   Subjective   Patient ID: Gerald Phillips, male    DOB: July 07, 1981  Age: 42 y.o. MRN: [id number]  CC:  Chief Complaint  Patient presents with   Follow-up    Patient was in the ED yesterday for L side chest and shoulder pain, with L arm numbness.     HPI Gerald Phillips 42 year male, presents to the clinic for chest pain was seen in the ER for this yesterday. He  has a past medical history of Anxiety, GERD (gastroesophageal reflux disease), Gout, and Hypertension.  Chest Pain  This is a chronic problem. The problem occurs intermittently. The problem has been waxing and waning. The pain is present in the substernal region. The pain is at a severity of 7/10. The quality of the pain is described as tightness, stabbing and sharp. The pain radiates to the left shoulder. Associated symptoms include back pain and dizziness. Pertinent negatives include no exertional chest pressure, lower extremity edema, malaise/fatigue, nausea, near-syncope, numbness or syncope. The pain is aggravated by movement, emotional upset and exertion. He has tried rest for the symptoms. The treatment provided no relief. Risk factors include male gender, obesity and smoking/tobacco exposure.  His family medical history is significant for hypertension. Prior diagnostic workup includes chest x-ray.      Outpatient Encounter Medications as of 10/16/2022  Medication Sig   amLODipine (NORVASC) 10 MG tablet Take 1 tablet (10 mg total) by mouth daily.   chlorthalidone (HYGROTON) 25 MG tablet Take 1 tablet (25 mg total) by mouth daily.   cyclobenzaprine (FLEXERIL) 10 MG tablet Take 1 tablet (10 mg total) by mouth 3 (three) times daily as needed for muscle spasms.   KRILL OIL PO Take by mouth.   LORazepam (ATIVAN) 1 MG tablet Take 1 tablet (1 mg total) by mouth daily.   losartan (COZAAR) 25 MG tablet Take 25 mg by mouth daily.   Magnesium 400 MG CAPS Take 1 capsule by mouth daily.   nitroGLYCERIN  (NITROSTAT) 0.4 MG SL tablet Place 1 tablet (0.4 mg total) under the tongue every 5 (five) minutes as needed for chest pain.   OVER THE COUNTER MEDICATION Vitamin D3 with K2   OVER THE COUNTER MEDICATION Take 1 tablet by mouth daily. Vitamin b6,folic acid,vitamin d and vitamin b12   OVER THE COUNTER MEDICATION Take 1 tablet by mouth daily. Scription BP   pantoprazole (PROTONIX) 20 MG tablet Take 1 tablet (20 mg total) by mouth daily.   hydrOXYzine (ATARAX) 10 MG tablet Take 1 tablet (10 mg total) by mouth 3 (three) times daily as needed for anxiety.   sertraline (ZOLOFT) 100 MG tablet Take 100 mg by mouth daily.   No facility-administered encounter medications on file as of 10/16/2022.    Past Surgical History:  Procedure Laterality Date   LAPAROTOMY N/A 08/25/2019   Procedure: EXPLORATORY LAPAROTOMY, RENAL EXPLORATION CLOSURE OF RETROPERITONEUM;  Surgeon: Manus Rudd, MD;  Location: [facility] OR;  Service: General;  Laterality: N/A;    Review of Systems  Constitutional:  Negative for malaise/fatigue.  Cardiovascular:  Positive for chest pain. Negative for syncope and near-syncope.  Gastrointestinal:  Negative for nausea.  Musculoskeletal:  Positive for back pain.  Neurological:  Positive for dizziness. Negative for numbness.      Objective    BP (!) 138/98   Pulse 76   Ht 5\' 11"  (1.803 m)   Wt 223 lb (101.2 kg)   SpO2 99%   BMI  31.10 kg/m   Physical Exam Vitals reviewed.  Constitutional:      General: He is not in acute distress.    Appearance: He is not ill-appearing, toxic-appearing or diaphoretic.  Eyes:     General:        Right eye: No discharge.        Left eye: No discharge.     Conjunctiva/sclera: Conjunctivae normal.  Cardiovascular:     Rate and Rhythm: Normal rate and regular rhythm.     Pulses: Normal pulses.     Heart sounds: Normal heart sounds.  Pulmonary:     Effort: Pulmonary effort is normal. No respiratory distress.     Breath sounds: Normal breath  sounds.  Musculoskeletal:        General: Normal range of motion.     Cervical back: Normal range of motion.  Skin:    General: Skin is warm and dry.     Capillary Refill: Capillary refill takes less than 2 seconds.  Neurological:     General: No focal deficit present.     Mental Status: He is alert and oriented to person, place, and time. Mental status is at baseline.  Psychiatric:        Mood and Affect: Mood normal.        Behavior: Behavior normal.        Thought Content: Thought content normal.        Judgment: Judgment normal.       Assessment & Plan:  Chest pain, unspecified type Assessment & Plan: Prescribed Nitroglycerin 0.4 mg SL , Advise Asprin 81 mg daily. BNP lab ordered. Chest xray and EKG and tropin levels done at ER  yesterday were all normal. Blood pressure not at goal- patient does not want increased dosage in his Blood pressure medications. Discussed signs and symptoms of major cardiovascular event and need to present to the ED if symptoms persist.  Orders: -     Brain natriuretic peptide -     Nitroglycerin; Place 1 tablet (0.4 mg total) under the tongue every 5 (five) minutes as needed for chest pain.  Dispense: 50 tablet; Refill: 3  Heartburn -     Pantoprazole Sodium; Take 1 tablet (20 mg total) by mouth daily.  Dispense: 30 tablet; Refill: 1    No follow-ups on file.   Cruzita Lederer Newman Nip, FNP

## 2022-10-16 NOTE — Assessment & Plan Note (Addendum)
Prescribed Nitroglycerin 0.4 mg SL , Advise Asprin 81 mg daily. BNP lab ordered. Chest xray and EKG and tropin levels done at ER  yesterday were all normal. Blood pressure not at goal- patient does not want increased dosage in his Blood pressure medications. Discussed signs and symptoms of major cardiovascular event and need to present to the ED if symptoms persist.

## 2022-10-16 NOTE — Transitions of Care (Post Inpatient/ED Visit) (Signed)
   10/16/2022  Name: Gerald Phillips MRN: [id number] DOB: 10/01/1980  Today's TOC FU Call Status: Today's TOC FU Call Status:: Successful TOC FU Call Competed TOC FU Call Complete Date: 10/16/22  Transition Care Management Follow-up Telephone Call Date of Discharge: 10/15/22 [facility] Type of Discharge: Emergency Department Reason for ED Visit: Other: (Chest pain, unspecified type) How have you been since you were released from the hospital?: Better Any questions or concerns?: No  Items Reviewed: Did you receive and understand the discharge instructions provided?: Yes Medications obtained and verified?: Yes (Medications Reviewed) Any new allergies since your discharge?: No Dietary orders reviewed?: NA Do you have support at home?: Yes  Home Care and Equipment/Supplies: Were Home Health Services Ordered?: NA Any new equipment or medical supplies ordered?: NA  Functional Questionnaire: Do you need assistance with bathing/showering or dressing?: No Do you need assistance with meal preparation?: No Do you need assistance with eating?: No Do you have difficulty maintaining continence: No Do you need assistance with getting out of bed/getting out of a chair/moving?: No Do you have difficulty managing or taking your medications?: No  Follow up appointments reviewed: PCP Follow-up appointment confirmed?: Yes MD Provider Line Number:[phone] Given: Yes Date of PCP follow-up appointment?: 10/16/22 Follow-up Provider: [facility] Records, FNP [facility] Follow-up appointment confirmed?: NA Do you need transportation to your follow-up appointment?: No Do you understand care options if your condition(s) worsen?: Yes-patient verbalized understanding    Agnes Lawrence, CMA (AAMA)  [facility] [phone]

## 2022-10-16 NOTE — Patient Instructions (Signed)
It was pleasure meeting with you today. Please take medications as prescribed. Follow up with your primary health provider if any health concerns arises. If symptoms worsen please contact your primary care provider and/or visit the emergency department.  

## 2022-10-19 LAB — BRAIN NATRIURETIC PEPTIDE: BNP: <2.5 pg/mL (ref 0.0–100.0)

## 2022-10-30 ENCOUNTER — Ambulatory Visit (INDEPENDENT_AMBULATORY_CARE_PROVIDER_SITE_OTHER): Payer: Medicaid Other | Admitting: Clinical

## 2022-10-30 DIAGNOSIS — F4310 Post-traumatic stress disorder, unspecified: Secondary | ICD-10-CM

## 2022-10-30 DIAGNOSIS — F411 Generalized anxiety disorder: Secondary | ICD-10-CM

## 2022-10-30 NOTE — Progress Notes (Signed)
IN PERSON   I connected with Gerald Phillips on 10/30/22 at 11:00 AM EDT in person and verified that I am speaking with the correct person using two identifiers.   Location: Patient: Office Provider: Office   I discussed the limitations of evaluation and management by telemedicine and the availability of in person appointments. The patient expressed understanding and agreed to proceed. ( IN PERSON)        THERAPIST PROGRESS NOTE   Session Time: 11:00 AM-11:45 AM   Participation Level: Active   Behavioral Response: CasualAlertAnxious   Type of Therapy: Individual Therapy   Treatment Goals addressed: Coping Anger Management for PTSD/GAD   Interventions: CBT, Motivational Interviewing, Solution Focused and Supportive   Summary: Gerald Phillips is a 42 y.o. male who presents with GAD and PTSD. The OPT therapist worked with the patient for his scheduled session. The OPT therapist utilized Motivational Interviewing to assist in creating therapeutic repore. The patient spoke about his recent ED visit for chest pain, however, after being evaluated and having bllod work done there was not a conclusive answer as to why he was having chest pain. The physicians concluded he was having strong Anxiety. The patient spoke about having no further episodes over the course of the past few weeks and has been implementing coping including music/faith/and journaling. The patient spoke about his ongoing involvement with his church and getting back into exercising . The OPT therapist utilized Cognitive Behavioral Therapy through cognitive restructuring as well as worked with the patient on  leveraging coping strategies to assist in management of PTSD/GAD. The patient continues to utlize his existing support network going out on a regular basis with his sister in the community. 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 The patient overviewed his upcoming work related events and workshoped using time management tool in this session. The OPT therapist over-viewed with the patient upcoming appointments as listed in his MyChart.    Suicidal/Homicidal: Nowithout intent/plan   Therapist Response: The OPT therapist worked with the patient for the patients scheduled session. The patient was engaged in his session and gave feedback in relation to triggers, symptoms, and behavior responses over the past few weeks. The OPT therapist worked with the patient utilizing an in session Cognitive Behavioral Therapy exercise. The patient was responsive in the session and verbalized," Things are constantly getting better things but I feel like its a back and forth with my emotions and feelings it gets better then worse and better and worse," The OPT therapist worked with the patient overviewing his support symptoms and  implementation of coping strategies including leveraging seasonal coping strategies connected to hobbies of cooking and fishing. The OPT therapist worked with the patient overviewing appointments listed in the patients MyChart.  The OPT therapist will continue treatment work with the patient in his next scheduled session.     Plan: Return again in 2/3 weeks.   Diagnosis:      Axis I: GAD /PTSD                           Axis II: No diagnosis     Collaboration of Care: No additional collaboration of care.    Patient/Guardian was advised Release of Information must be obtained prior to any record release in order to  collaborate their care with an outside provider. Patient/Guardian was advised if they have not already done so to contact the registration department to sign all necessary forms in order for [place] to release information regarding their care.    Consent: Patient/Guardian gives verbal consent for treatment and assignment of benefits for services provided during this  visit. Patient/Guardian expressed understanding and agreed to proceed.    I discussed the assessment and treatment plan with the patient. The patient was provided an opportunity to ask questions and all were answered. The patient agreed with the plan and demonstrated an understanding of the instructions.   The patient was advised to call back or seek an in-person evaluation if the symptoms worsen or if the condition fails to improve as anticipated.   I provided 30 minutes of face-to-face time during this encounter.   Winfred Burn, LCSW    10/30/2022

## 2022-11-01 ENCOUNTER — Ambulatory Visit (INDEPENDENT_AMBULATORY_CARE_PROVIDER_SITE_OTHER): Payer: PRIVATE HEALTH INSURANCE | Admitting: "Endocrinology

## 2022-11-01 ENCOUNTER — Encounter: Payer: Self-pay | Admitting: "Endocrinology

## 2022-11-01 VITALS — BP 122/90 | HR 72 | Ht 71.0 in | Wt 224.2 lb

## 2022-11-01 DIAGNOSIS — F17200 Nicotine dependence, unspecified, uncomplicated: Secondary | ICD-10-CM | POA: Diagnosis not present

## 2022-11-01 DIAGNOSIS — E278 Other specified disorders of adrenal gland: Secondary | ICD-10-CM | POA: Diagnosis not present

## 2022-11-01 DIAGNOSIS — E349 Endocrine disorder, unspecified: Secondary | ICD-10-CM | POA: Insufficient documentation

## 2022-11-01 DIAGNOSIS — I10 Essential (primary) hypertension: Secondary | ICD-10-CM

## 2022-11-01 NOTE — Progress Notes (Signed)
Endocrinology Consult Note                                            11/01/2022, 5:00 PM   Subjective:    Patient ID: Gerald Phillips, male    DOB: [birth year], PCP Del Newman Nip, Tenna Child, FNP   Past Medical History:  Diagnosis Date   Anxiety    GERD (gastroesophageal reflux disease)    Gout    Hypertension    Past Surgical History:  Procedure Laterality Date   LAPAROTOMY N/A 08/25/2019   Procedure: EXPLORATORY LAPAROTOMY, RENAL EXPLORATION CLOSURE OF RETROPERITONEUM;  Surgeon: Manus Rudd, MD;  Location: [facility] OR;  Service: General;  Laterality: N/A;   Social History   Socioeconomic History   Marital status: Married    Spouse name: Not on file   Number of children: Not on file   Years of education: Not on file   Highest education level: Not on file  Occupational History   Not on file  Tobacco Use   Smoking status: Every Day    Packs/day: .5    Types: Cigars, Cigarettes   Smokeless tobacco: Never  Vaping Use   Vaping Use: Never used  Substance and Sexual Activity   Alcohol use: Not Currently    Comment: occasionally   Drug use: Not Currently   Sexual activity: Not on file  Other Topics Concern   Not on file  Social History Narrative   ** Merged History Encounter **       Social Determinants of Health   Financial Resource Strain: Not on file  Food Insecurity: Not on file  Transportation Needs: Not on file  Physical Activity: Not on file  Stress: Not on file  Social Connections: Not on file   Family History  Problem Relation Age of Onset   Cancer Maternal Grandmother    Heart attack Maternal Grandfather    Outpatient Encounter Medications as of 11/01/2022  Medication Sig   amLODipine (NORVASC) 10 MG tablet Take 1 tablet (10 mg total) by mouth daily.   chlorthalidone (HYGROTON) 25 MG tablet Take 1 tablet (25 mg total) by mouth daily. (Patient not taking: Reported on 11/01/2022)   cyclobenzaprine (FLEXERIL) 10 MG tablet Take 1 tablet (10 mg  total) by mouth 3 (three) times daily as needed for muscle spasms.   KRILL OIL PO Take by mouth.   LORazepam (ATIVAN) 1 MG tablet Take 1 tablet (1 mg total) by mouth daily.   losartan (COZAAR) 25 MG tablet Take 25 mg by mouth daily.   Magnesium 400 MG CAPS Take 1 capsule by mouth daily.   nitroGLYCERIN (NITROSTAT) 0.4 MG SL tablet Place 1 tablet (0.4 mg total) under the tongue every 5 (five) minutes as needed for chest pain.   OVER THE COUNTER MEDICATION Vitamin D3 with K2 (Patient not taking: Reported on 11/01/2022)   OVER THE COUNTER MEDICATION Take 1 tablet by mouth daily. Vitamin b6,folic acid,vitamin d and vitamin b12 (Patient not taking: Reported on 11/01/2022)   OVER THE COUNTER MEDICATION Take 1 tablet by mouth daily. Scription BP   pantoprazole (PROTONIX) 20 MG tablet Take 1 tablet (20 mg total) by mouth daily. (Patient not taking: Reported on 11/01/2022)   sertraline (ZOLOFT) 100 MG tablet Take 100 mg by mouth daily. (Patient not taking: Reported on 11/01/2022)   [DISCONTINUED] hydrOXYzine (ATARAX) 10  MG tablet Take 1 tablet (10 mg total) by mouth 3 (three) times daily as needed for anxiety.   No facility-administered encounter medications on file as of 11/01/2022.   ALLERGIES: Allergies  Allergen Reactions   Atarax [Hydroxyzine]     tremors   Penicillins     Reaction is unknown: Son has a reaction of hives and rash   Penicillins Hives    Did it involve swelling of the face/tongue/throat, SOB, or low BP? No Did it involve sudden or severe rash/hives, skin peeling, or any reaction on the inside of your mouth or nose? N/A Did you need to seek medical attention at a hospital or doctor's office? N/A When did it last happen? Child        If all above answers are "NO", may proceed with cephalosporin use.    VACCINATION STATUS: Immunization History  Administered Date(s) Administered   PFIZER(Purple Top)SARS-COV-2 Vaccination 03/09/2020, 03/30/2020    HPI Gerald Phillips is 42 y.o.  male who presents today with a medical history as above. he is being seen in consultation for adrenal cyst requested by Del Nigel Berthold, FNP. History is obtained directly from the patient as well as chart review.  He is not an optimal historian.  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  He does not recall if any kind of treatment was offered to him.  A more recent 2020 abdominal CT showed 6.4 cm left renal cyst.  A more recent August 20, 2022 CT angio chest for PE protocol showed  1. No PE, aneurysm or dissection. 2. Left upper quadrant cystic lesion with nodular calcification  presumably arising from the left adrenal gland that was only  partially imaged on this examination.  Patient describes some vague pain behind his left kidney. His other medical problems include hypertension on 2 medications including Norvasc 10 mg p.o. daily and losartan 25 mg p.o. once a day. He denies any prior history of adrenal dysfunction.  He is a smoker. He reports intermittent palpitations, as well as diaphoresis.  He denies any family history of Adrena, parathyroid, pituitary dysfunctions. He denies any paroxysms of headaches, fainting spells.  He reports progressive weight gain.   Review of Systems  Constitutional: +  fluctuating body weight , +no fatigue, no subjective hyperthermia, no subjective hypothermia Eyes: no blurry vision, no xerophthalmia ENT: no sore throat, no nodules palpated in throat, no dysphagia/odynophagia, no hoarseness Cardiovascular: no Chest Pain, no Shortness of Breath, no palpitations, no leg swelling Respiratory: no cough, no shortness of breath Gastrointestinal: no Nausea/Vomiting/Diarhhea Musculoskeletal: + Diffuse body aches  Skin: no rashes Neurological: no tremors, no numbness, no tingling, no dizziness Psychiatric: no depression, no anxiety  Objective:       11/01/2022    9:24 AM 10/16/2022     4:15 PM 10/16/2022    4:11 PM  Vitals with BMI  Height 5\' 11"   5\' 11"   Weight 224 lbs 3 oz  223 lbs  BMI 31.28  31.12  Systolic 122 138 782  Diastolic 90 98 92  Pulse 72  76    BP (!) 122/90   Pulse 72   Ht 5\' 11"  (1.803 m)   Wt 224 lb 3.2 oz (101.7 kg)   BMI 31.27 kg/m   Wt Readings from Last 3 Encounters:  11/01/22 224 lb 3.2 oz (101.7 kg)  10/16/22 223 lb (101.2 kg)  10/15/22 220 lb (99.8 kg)  Physical Exam  Constitutional:  Body mass index is 31.27 kg/m.,  not in acute distress, normal state of mind Eyes: PERRLA, EOMI, no exophthalmos ENT: moist mucous membranes, no gross thyromegaly, no gross cervical lymphadenopathy Cardiovascular: normal precordial activity, Regular Rate and Rhythm, no Murmur/Rubs/Gallops Respiratory:  adequate breathing efforts, no gross chest deformity, Clear to auscultation bilaterally Gastrointestinal: abdomen soft, Non -tender, No distension, Bowel Sounds present, no gross organomegaly Musculoskeletal: no gross deformities, strength intact in all four extremities, no peripheral edema Skin: moist, warm, no rashes Neurological: no tremor with outstretched hands, Deep tendon reflexes normal in bilateral lower extremities.  CMP ( most recent) CMP     Component Value Date/Time   NA 137 10/15/2022 1559   NA 139 07/11/2022 1136   K 3.3 (L) 10/15/2022 1559   CL 100 10/15/2022 1559   CO2 28 10/15/2022 1559   GLUCOSE 98 10/15/2022 1559   BUN 9 10/15/2022 1559   BUN 8 07/11/2022 1136   CREATININE 1.05 10/15/2022 1559   CALCIUM 9.5 10/15/2022 1559   PROT 7.2 09/03/2022 2042   PROT 7.7 07/11/2022 1136   ALBUMIN 4.4 09/03/2022 2042   ALBUMIN 5.3 (H) 07/11/2022 1136   AST 20 09/03/2022 2042   ALT 24 09/03/2022 2042   ALKPHOS 58 09/03/2022 2042   BILITOT 0.3 09/03/2022 2042   BILITOT 0.5 07/11/2022 1136   GFRNONAA >60 10/15/2022 1559   GFRAA >60 11/03/2019 1237     Diabetic Labs (most recent): Lab Results  Component Value Date   HGBA1C  6.0 (H) 07/11/2022     Lipid Panel ( most recent) Lipid Panel     Component Value Date/Time   CHOL 141 07/11/2022 1136   TRIG 93 07/11/2022 1136   HDL 53 07/11/2022 1136   CHOLHDL 2.7 07/11/2022 1136   LDLCALC 71 07/11/2022 1136   LABVLDL 17 07/11/2022 1136      Lab Results  Component Value Date   TSH 1.170 07/11/2022   FREET4 1.42 07/11/2022    Febrile 13 2024 CT angio chest for PE showed IMPRESSION: 1. No PE, aneurysm or dissection. 2. 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  Assessment & Plan:   1.  Left adrenal cystic mass lesion  2.  Hypertension   3.  Smoker  - REILY ILIC  is being seen at a kind request of Del Newman Nip, Tenna Child, FNP. - I have reviewed his available endocrine records and clinically evaluated the patient. - Based on these reviews, he has nonspecific, and left adrenal cystic lesion,  however,  there is not sufficient information to proceed with definitive treatment plan.  Size specification was not given during this imaging.  - he will need a complete functional adrenal assessment with 24-hour urine collection for measurement of catecholamines, metanephrines, aldosterone, cortisol along with creatinine.    -He may need repeat adrenal protocol CT scan especially if he returns with functional mass lesion. He is advised to continue his current blood pressure medications.  - I did not initiate any new prescriptions today. The patient was counseled on the dangers of tobacco use, and was advised to quit.  Reviewed strategies to maximize success, including removing cigarettes and smoking materials from environment.  - he is advised to maintain close follow up with Del Nigel Berthold, FNP for primary care needs.   - Time spent with the patient: 45 minutes, of which >50% was  spent in  counseling him about his left adrenal  cystic mass and the rest in obtaining information about his symptoms, reviewing his previous labs/studies ( including abstractions from other facilities),  evaluations, and treatments,  and developing a plan to confirm diagnosis and long term treatment based on the latest standards of care/guidelines; and documenting his care.  Martie Lee participated in the discussions, expressed understanding, and voiced agreement with the above plans.  All questions were answered to his satisfaction. he is encouraged to contact clinic should he have any questions or concerns prior to his return visit.  Follow up plan: Return in about 2 weeks (around 11/15/2022) for F/U with Pre-visit Labs.   Marquis Lunch, MD [facility] [address] [place], [place] [address] Phone: [phone]  Fax: [phone]     11/01/2022, 5:00 PM  This note was partially dictated with voice recognition software. Similar sounding words can be transcribed inadequately or may not  be corrected upon review.

## 2022-11-06 DIAGNOSIS — E349 Endocrine disorder, unspecified: Secondary | ICD-10-CM | POA: Diagnosis not present

## 2022-11-07 LAB — CREATININE, URINE, 24 HOUR
Creatinine, 24H Ur: 2395 mg/24 hr — ABNORMAL HIGH (ref 1000–2000)
Creatinine, Urine: 88.7 mg/dL

## 2022-11-08 DIAGNOSIS — E349 Endocrine disorder, unspecified: Secondary | ICD-10-CM | POA: Diagnosis not present

## 2022-11-11 ENCOUNTER — Ambulatory Visit (HOSPITAL_COMMUNITY): Payer: Medicaid Other | Admitting: Clinical

## 2022-11-11 ENCOUNTER — Telehealth (HOSPITAL_COMMUNITY): Payer: Self-pay | Admitting: Clinical

## 2022-11-11 NOTE — Telephone Encounter (Signed)
The patient did not respond to contact attempts.  

## 2022-11-12 LAB — METANEPHRINES, URINE, 24 HOUR
Metaneph Total, Ur: 60 ug/L
Metanephrines, 24H Ur: 162 ug/24 hr (ref 58–276)
Normetanephrine, 24H Ur: 473 ug/24 hr (ref 156–729)
Normetanephrine, Ur: 175 ug/L

## 2022-11-13 DIAGNOSIS — G4733 Obstructive sleep apnea (adult) (pediatric): Secondary | ICD-10-CM | POA: Diagnosis not present

## 2022-11-15 LAB — CATECHOLAMINES, FRACTIONATED, URINE, 24 HOUR
Dopamine , 24H Ur: 234 ug/24 hr (ref 0–510)
Dopamine, Rand Ur: 130 ug/L
Epinephrine, 24H Ur: 7 ug/24 hr (ref 0–20)
Epinephrine, Rand Ur: 4 ug/L
Norepinephrine, 24H Ur: 49 ug/24 hr (ref 0–135)
Norepinephrine, Rand Ur: 27 ug/L

## 2022-11-18 LAB — ALDOSTERONE, URINE
Aldosterone U,Random: 3.55 ug/L
Aldosterone, 24H Ur: 9.59 ug/24 hr (ref 0.00–19.00)

## 2022-11-20 ENCOUNTER — Ambulatory Visit: Payer: PRIVATE HEALTH INSURANCE | Attending: Internal Medicine

## 2022-11-20 ENCOUNTER — Ambulatory Visit (HOSPITAL_COMMUNITY): Payer: Medicaid Other | Admitting: Clinical

## 2022-11-20 ENCOUNTER — Encounter: Payer: Self-pay | Admitting: Internal Medicine

## 2022-11-20 ENCOUNTER — Ambulatory Visit: Payer: Medicaid Other | Attending: Internal Medicine | Admitting: Internal Medicine

## 2022-11-20 VITALS — BP 130/70 | HR 74 | Ht 70.0 in | Wt 225.0 lb

## 2022-11-20 DIAGNOSIS — R002 Palpitations: Secondary | ICD-10-CM

## 2022-11-20 DIAGNOSIS — R079 Chest pain, unspecified: Secondary | ICD-10-CM | POA: Diagnosis not present

## 2022-11-20 MED ORDER — METOPROLOL TARTRATE 100 MG PO TABS: 100.0000 mg | ORAL_TABLET | ORAL | 0 refills | Status: DC

## 2022-11-20 NOTE — Progress Notes (Signed)
Cardiology Office Note  Date: 11/20/2022   ID: Gerald Phillips, DOB Feb 09, 1981, MRN [id number]  PCP:  Rica Records, FNP  Cardiologist:  Marjo Bicker, MD Electrophysiologist:  None   Reason for Office Visit: Follow-up of chest pains   History of Present Illness: Gerald Phillips is a 42 y.o. male known to have HTN, gout, GERD is here for follow-up of chest pain.  Patient was initially referred to cardiology clinic in 06/2022 for 20-month history of chest pains especially occurring with anxiety/panic attack. Anxiety management was recommended to treat the chest pains. However he returned back to the clinic today and continues to complain of chest pains happening a few times per week especially at rest and sometimes occurs with exertion (when he walks around his house, in the yard).  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 He denies having any chest pains during that time.  He also reported having fluttering of his heart frequently, few times per week.  Feels like his heart is skipping a beat.  Denies other symptoms of SOB, dizziness, leg swelling. Has vertigo for which she is seeking medical attention. Current smoker, smokes occasionally, denied alcohol use or illicit drug abuse. No family history premature ASCVD.  He checks his blood pressure at home and ranges in 110-120s SBP.   Past Medical History:  Diagnosis Date   Anxiety    GERD (gastroesophageal reflux disease)    Gout    Hypertension     Past Surgical History:  Procedure Laterality Date   LAPAROTOMY N/A 08/25/2019   Procedure: EXPLORATORY LAPAROTOMY, RENAL EXPLORATION CLOSURE OF RETROPERITONEUM;  Surgeon: Manus Rudd, MD;  Location: [facility] OR;  Service: General;  Laterality: N/A;    Current Outpatient Medications  Medication Sig Dispense Refill   amLODipine (NORVASC) 10 MG tablet Take 1 tablet (10 mg total) by mouth daily. 30 tablet 2   chlorthalidone (HYGROTON) 25 MG  tablet Take 1 tablet (25 mg total) by mouth daily. 30 tablet 2   cyclobenzaprine (FLEXERIL) 10 MG tablet Take 1 tablet (10 mg total) by mouth 3 (three) times daily as needed for muscle spasms. 30 tablet 0   KRILL OIL PO Take by mouth.     LORazepam (ATIVAN) 1 MG tablet Take 1 tablet (1 mg total) by mouth daily. 7 tablet 0   Magnesium 400 MG CAPS Take 1 capsule by mouth daily.     nitroGLYCERIN (NITROSTAT) 0.4 MG SL tablet Place 1 tablet (0.4 mg total) under the tongue every 5 (five) minutes as needed for chest pain. 50 tablet 3   losartan (COZAAR) 25 MG tablet Take 25 mg by mouth daily. (Patient not taking: Reported on 11/20/2022)     OVER THE COUNTER MEDICATION Vitamin D3 with K2 (Patient not taking: Reported on 11/01/2022)     OVER THE COUNTER MEDICATION Take 1 tablet by mouth daily. Vitamin b6,folic acid,vitamin d and vitamin b12 (Patient not taking: Reported on 11/01/2022)     OVER THE COUNTER MEDICATION Take 1 tablet by mouth daily. Scription BP (Patient not taking: Reported on 11/20/2022)     pantoprazole (PROTONIX) 20 MG tablet Take 1 tablet (20 mg total) by mouth daily. (Patient not taking: Reported on 11/01/2022) 30 tablet 1   sertraline (ZOLOFT) 100 MG tablet Take 100 mg by mouth daily. (Patient not taking: Reported on 11/01/2022)     No current facility-administered medications for this visit.   Allergies:  Atarax [hydroxyzine],  Penicillins, and Penicillins   Social History: The patient  reports that he has been smoking cigars and cigarettes. He has been smoking an average of .5 packs per day. He has never used smokeless tobacco. He reports that he does not currently use alcohol. He reports that he does not currently use drugs.   Family History: The patient's family history includes Cancer in his maternal grandmother; Heart attack in his maternal grandfather.   ROS:  Please see the history of present illness. Otherwise, complete review of systems is positive for none.  All other systems are  reviewed and negative.   Physical Exam: VS:  BP 130/70   Pulse 74   Ht 5\' 10"  (1.778 m)   Wt 225 lb (102.1 kg)   SpO2 96%   BMI 32.28 kg/m , BMI Body mass index is 32.28 kg/m.  Wt Readings from Last 3 Encounters:  11/20/22 225 lb (102.1 kg)  11/01/22 224 lb 3.2 oz (101.7 kg)  10/16/22 223 lb (101.2 kg)    General: Patient appears comfortable at rest. HEENT: Conjunctiva and lids normal, oropharynx clear with moist mucosa. Neck: Supple, no elevated JVP or carotid bruits, no thyromegaly. Lungs: Clear to auscultation, nonlabored breathing at rest. Cardiac: Regular rate and rhythm, no S3 or significant systolic murmur, no pericardial rub. Abdomen: Soft, nontender, no hepatomegaly, bowel sounds present, no guarding or rebound. Extremities: No pitting edema, distal pulses 2+. Skin: Warm and dry. Musculoskeletal: No kyphosis. Neuropsychiatric: Alert and oriented x3, affect grossly appropriate.  ECG:  NSR and no ST changes  Recent Labwork: 07/11/2022: TSH 1.170 09/03/2022: ALT 24; AST 20; Magnesium 2.0 10/15/2022: BUN 9; Creatinine, Ser 1.05; Hemoglobin 16.6; Platelets 181; Potassium 3.3; Sodium 137 10/17/2022: BNP <2.5     Component Value Date/Time   CHOL 141 07/11/2022 1136   TRIG 93 07/11/2022 1136   HDL 53 07/11/2022 1136   CHOLHDL 2.7 07/11/2022 1136   LDLCALC 71 07/11/2022 1136    Other Studies Reviewed Today:   Assessment and Plan: Patient is a 42 year old M known to have HTN was referred to cardiology clinic for management of HTN and atypical chest pain.  # Atypical chest pain -Mostly occurs at rest and sometimes with exertion. Obtain CTA cardiac to rule out any anomalous coronaries and myocardial bridging. Suspicion for CAD is very low.  # Palpitations -Patient has palpitations (feeling like his heart is skipping a beat), few times per week.  Obtain 2-week event monitor.  # HTN, controlled -Continue amlodipine 10 mg once daily and chlorthalidone 20 mg once  daily. -Home blood pressures ranged between 110 to 120 mg mercury SBP. -Echocardiogram showed normal LVEF and no valve abnormalities. -Currently on CPAP therapy for the last 2 weeks.  # Nicotine abuse -Smoking cessation instruction/counseling given:  counseled patient on the dangers of tobacco use, advised patient to stop smoking, and reviewed strategies to maximize success   # OSA on CPAP -Continue CPAP therapy, started to use it for the last 2 weeks.  I have spent a total of 30 minutes with patient reviewing chart, EKGs, labs and examining patient as well as establishing an assessment and plan that was discussed with the patient.  > 50% of time was spent in direct patient care.     Medication Adjustments/Labs and Tests Ordered: Current medicines are reviewed at length with the patient today.  Concerns regarding medicines are outlined above.   Tests Ordered: No orders of the defined types were placed in this encounter.   Medication Changes:  No orders of the defined types were placed in this encounter.   Disposition:  Follow up  pending results  Signed, Julyana Woolverton Verne Spurr, MD, 11/20/2022 11:28 AM    [facility] [address], [place], [place] [address]

## 2022-11-20 NOTE — Patient Instructions (Addendum)
Medication Instructions:  Your physician recommends that you continue on your current medications as directed. Please refer to the Current Medication list given to you today.  Take 100 mg of Lopressor 2 hours prior to your CT scan    *If you need a refill on your cardiac medications before your next appointment, please call your pharmacy*   Lab Work: Your physician recommends that you return for lab work in: BMET   If you have labs (blood work) drawn today and your tests are completely normal, you will receive your results only by: [employer] (if you have MyChart) OR A paper copy in the mail If you have any lab test that is abnormal or we need to change your treatment, we will call you to review the results.   Testing/Procedures:   Your cardiac CT will be scheduled at one of the below locations:   [facility] [address] [place], [place] [address] [phone]  OR  [facility] [address] [place], [place] [address] [phone]  OR   [facility] [address] [place], [place] [address] [phone]  If scheduled at [facility], please arrive at the [facility] and Children's Entrance (Entrance C2) of [facility] 30 minutes prior to test start time. You can use the FREE valet parking offered at entrance C (encouraged to control the heart rate for the test)  Proceed to the [facility] Radiology Department (first floor) to check-in and test prep.  All radiology patients and guests should use entrance C2 at [facility], accessed from [facility], even though the hospital's physical address listed is [address].    If scheduled at [facility] or [facility], please arrive 15 mins early for check-in and test prep.   Please follow these instructions carefully (unless otherwise  directed):  Hold all erectile dysfunction medications at least 3 days (72 hrs) prior to test. (Ie viagra, cialis, sildenafil, tadalafil, etc) We will administer nitroglycerin during this exam.   On the Night Before the Test: Be sure to Drink plenty of water. Do not consume any caffeinated/decaffeinated beverages or chocolate 12 hours prior to your test. Do not take any antihistamines 12 hours prior to your test.  On the Day of the Test: Drink plenty of water until 1 hour prior to the test. Do not eat any food 1 hour prior to test. You may take your regular medications prior to the test.  Take metoprolol (Lopressor) two hours prior to test. If you take Furosemide/Hydrochlorothiazide/Spironolactone, please HOLD on the morning of the test.   *For Clinical Staff only. Please instruct patient the following:* Heart Rate Medication Recommendations for Cardiac CT  Resting HR < 50 bpm  No medication  Resting HR 50-60 bpm and BP >110/50 mmHG   Consider Metoprolol tartrate 25 mg PO 90-120 min prior to scan  Resting HR 60-65 bpm and BP >110/50 mmHG  Metoprolol tartrate 50 mg PO 90-120 minutes prior to scan   Resting HR > 65 bpm and BP >110/50 mmHG  Metoprolol tartrate 100 mg PO 90-120 minutes prior to scan  Consider Ivabradine 10-15 mg PO or a calcium channel blocker for resting HR >60 bpm and contraindication to metoprolol tartrate  Consider Ivabradine 10-15 mg PO in combination with metoprolol tartrate for HR >80 bpm         After the Test: Drink plenty of water. After  receiving IV contrast, you may experience a mild flushed feeling. This is normal. On occasion, you may experience a mild rash up to 24 hours after the test. This is not dangerous. If this occurs, you can take Benadryl 25 mg and increase your fluid intake. If you experience trouble breathing, this can be serious. If it is severe call 911 IMMEDIATELY. If it is mild, please call our office. If you take any of these medications:  Glipizide/Metformin, Avandament, Glucavance, please do not take 48 hours after completing test unless otherwise instructed.  We will call to schedule your test 2-4 weeks out understanding that some insurance companies will need an authorization prior to the service being performed.   For non-scheduling related questions, please contact the cardiac imaging nurse navigator should you have any questions/concerns: Rockwell Alexandria, Cardiac Imaging Nurse Navigator Larey Brick, Cardiac Imaging Nurse Navigator [facility] Direct Office Dial: [phone]   For scheduling needs, including cancellations and rescheduling, please call [place], [phone].    Follow-Up: At [facility], you and your health needs are our priority.  As part of our continuing mission to provide you with exceptional heart care, we have created designated Provider Care Teams.  These Care Teams include your primary Cardiologist (physician) and Advanced Practice Providers (APPs -  Physician Assistants and Nurse Practitioners) who all work together to provide you with the care you need, when you need it.  We recommend signing up for the patient portal called "MyChart".  Sign up information is provided on this After Visit Summary.  MyChart is used to connect with patients for Virtual Visits (Telemedicine).  Patients are able to view lab/test results, encounter notes, upcoming appointments, etc.  Non-urgent messages can be sent to your provider as well.   To learn more about what you can do with MyChart, go to [email or web address].    Your next appointment:    Pending test results   Provider:   Luane School, MD    Other Instructions Thank you for choosing [facility]!

## 2022-11-21 ENCOUNTER — Encounter: Payer: Self-pay | Admitting: "Endocrinology

## 2022-11-21 ENCOUNTER — Ambulatory Visit (INDEPENDENT_AMBULATORY_CARE_PROVIDER_SITE_OTHER): Payer: PRIVATE HEALTH INSURANCE | Admitting: "Endocrinology

## 2022-11-21 VITALS — BP 128/72 | HR 64 | Ht 70.0 in | Wt 224.2 lb

## 2022-11-21 DIAGNOSIS — R002 Palpitations: Secondary | ICD-10-CM | POA: Insufficient documentation

## 2022-11-21 DIAGNOSIS — I10 Essential (primary) hypertension: Secondary | ICD-10-CM

## 2022-11-21 DIAGNOSIS — F17200 Nicotine dependence, unspecified, uncomplicated: Secondary | ICD-10-CM

## 2022-11-21 DIAGNOSIS — E278 Other specified disorders of adrenal gland: Secondary | ICD-10-CM | POA: Diagnosis not present

## 2022-11-21 NOTE — Progress Notes (Signed)
11/21/2022, 11:25 AM  Endocrinology follow-up note   Subjective:    Patient ID: Gerald Phillips, male    DOB: [birth year], PCP Del Newman Nip, Tenna Child, FNP   Past Medical History:  Diagnosis Date   Anxiety    GERD (gastroesophageal reflux disease)    Gout    Hypertension    Past Surgical History:  Procedure Laterality Date   LAPAROTOMY N/A 08/25/2019   Procedure: EXPLORATORY LAPAROTOMY, RENAL EXPLORATION CLOSURE OF RETROPERITONEUM;  Surgeon: Manus Rudd, MD;  Location: [facility] OR;  Service: General;  Laterality: N/A;   Social History   Socioeconomic History   Marital status: Married    Spouse name: Not on file   Number of children: Not on file   Years of education: Not on file   Highest education level: Not on file  Occupational History   Not on file  Tobacco Use   Smoking status: Every Day    Packs/day: .5    Types: Cigars, Cigarettes   Smokeless tobacco: Never  Vaping Use   Vaping Use: Never used  Substance and Sexual Activity   Alcohol use: Not Currently    Comment: occasionally   Drug use: Not Currently   Sexual activity: Not on file  Other Topics Concern   Not on file  Social History Narrative   ** Merged History Encounter **       Social Determinants of Health   Financial Resource Strain: Not on file  Food Insecurity: Not on file  Transportation Needs: Not on file  Physical Activity: Not on file  Stress: Not on file  Social Connections: Not on file   Family History  Problem Relation Age of Onset   Cancer Maternal Grandmother    Heart attack Maternal Grandfather    Outpatient Encounter Medications as of 11/21/2022  Medication Sig   amLODipine (NORVASC) 10 MG tablet Take 1 tablet (10 mg total) by mouth daily.   chlorthalidone (HYGROTON) 25 MG tablet Take 1 tablet (25 mg total) by mouth daily.   cyclobenzaprine (FLEXERIL) 10 MG tablet Take 1 tablet (10 mg total) by mouth 3 (three) times daily as  needed for muscle spasms.   KRILL OIL PO Take by mouth.   LORazepam (ATIVAN) 1 MG tablet Take 1 tablet (1 mg total) by mouth daily.   losartan (COZAAR) 25 MG tablet Take 25 mg by mouth daily. (Patient not taking: Reported on 11/20/2022)   Magnesium 400 MG CAPS Take 1 capsule by mouth daily.   metoprolol tartrate (LOPRESSOR) 100 MG tablet Take 1 tablet (100 mg total) by mouth as directed. Two Hours Prior to CT Scan   nitroGLYCERIN (NITROSTAT) 0.4 MG SL tablet Place 1 tablet (0.4 mg total) under the tongue every 5 (five) minutes as needed for chest pain.   OVER THE COUNTER MEDICATION Vitamin D3 with K2 (Patient not taking: Reported on 11/01/2022)   OVER THE COUNTER MEDICATION Take 1 tablet by mouth daily. Vitamin b6,folic acid,vitamin d and vitamin b12 (Patient not taking: Reported on 11/01/2022)   OVER THE COUNTER MEDICATION Take 1 tablet by mouth daily. Scription BP (Patient not taking: Reported on 11/20/2022)   pantoprazole (PROTONIX) 20 MG tablet Take 1 tablet (20 mg total) by mouth daily. (Patient  not taking: Reported on 11/01/2022)   sertraline (ZOLOFT) 100 MG tablet Take 100 mg by mouth daily. (Patient not taking: Reported on 11/01/2022)   No facility-administered encounter medications on file as of 11/21/2022.   ALLERGIES: Allergies  Allergen Reactions   Atarax [Hydroxyzine]     tremors   Penicillins     Reaction is unknown: Son has a reaction of hives and rash   Penicillins Hives    Did it involve swelling of the face/tongue/throat, SOB, or low BP? No Did it involve sudden or severe rash/hives, skin peeling, or any reaction on the inside of your mouth or nose? N/A Did you need to seek medical attention at a hospital or doctor's office? N/A When did it last happen? Child        If all above answers are "NO", may proceed with cephalosporin use.    VACCINATION STATUS: Immunization History  Administered Date(s) Administered   PFIZER(Purple Top)SARS-COV-2 Vaccination 03/09/2020,  03/30/2020    HPI Gerald Phillips is 42 y.o. male who presents today with a medical history as above. he is being seen in follow-up after he was seen in consultation for adrenal cyst requested by Del Nigel Berthold, FNP. History is obtained directly from the patient as well as chart review.  He is not an optimal historian.  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  He does not recall if any kind of treatment was offered to him.   A more recent, January 2024 abdominal CT revealed 7 cm left adrenal lesion :  Upper Abdomen: Fluid attenuation lesion with mural nodular calcification noted in the region of the left adrenal gland which was only partially imaged on this examination. The portion that was imaged measures 7 cm.  A more recent August 20, 2022 CT angio chest for PE protocol showed  1. No PE, aneurysm or dissection. 2. Left upper quadrant cystic lesion with nodular calcification  presumably arising from the left adrenal gland that was only  partially imaged on this examination.  Patient describes some vague pain behind his left kidney.  His other medical problems include hypertension on 2 medications including Norvasc 10 mg p.o. daily and losartan 25 mg p.o. once a day. He denies any prior history of adrenal dysfunction.  He is a smoker. He reports intermittent palpitations, as well as diaphoresis.  He denies any family history of Adrena, parathyroid, pituitary dysfunctions. He denies any paroxysms of headaches, fainting spells.  He reports progressive weight gain. He does not have any new complaints today.   Review of Systems  Constitutional: +  fluctuating body weight , +no fatigue, no subjective hyperthermia, no subjective hypothermia  Objective:       11/21/2022    9:34 AM 11/20/2022   11:20 AM 11/01/2022    9:24 AM  Vitals with BMI  Height 5\' 10"  5\' 10"  5\' 11"   Weight 224 lbs 3 oz 225 lbs 224 lbs 3  oz  BMI 32.17 32.28 31.28  Systolic 128 130 409  Diastolic 72 70 90  Pulse 64 74 72    BP 128/72   Pulse 64   Ht 5\' 10"  (1.778 m)   Wt 224 lb 3.2 oz (101.7 kg)   BMI 32.17 kg/m   Wt Readings from Last 3 Encounters:  11/21/22 224 lb 3.2 oz (101.7 kg)  11/20/22 225 lb (102.1 kg)  11/01/22 224 lb 3.2 oz (101.7 kg)  Physical Exam  Constitutional:  Body mass index is 32.17 kg/m.,  not in acute distress, normal state of mind Eyes: PERRLA, EOMI, no exophthalmos ENT: moist mucous membranes, no gross thyromegaly, no gross cervical lymphadenopathy Cardiovascular: normal precordial activity, Regular Rate and Rhythm, no Murmur/Rubs/Gallops   CMP ( most recent) CMP     Component Value Date/Time   NA 137 10/15/2022 1559   NA 139 07/11/2022 1136   K 3.3 (L) 10/15/2022 1559   CL 100 10/15/2022 1559   CO2 28 10/15/2022 1559   GLUCOSE 98 10/15/2022 1559   BUN 9 10/15/2022 1559   BUN 8 07/11/2022 1136   CREATININE 1.05 10/15/2022 1559   CALCIUM 9.5 10/15/2022 1559   PROT 7.2 09/03/2022 2042   PROT 7.7 07/11/2022 1136   ALBUMIN 4.4 09/03/2022 2042   ALBUMIN 5.3 (H) 07/11/2022 1136   AST 20 09/03/2022 2042   ALT 24 09/03/2022 2042   ALKPHOS 58 09/03/2022 2042   BILITOT 0.3 09/03/2022 2042   BILITOT 0.5 07/11/2022 1136   GFRNONAA >60 10/15/2022 1559   GFRAA >60 11/03/2019 1237     Diabetic Labs (most recent): Lab Results  Component Value Date   HGBA1C 6.0 (H) 07/11/2022     Lipid Panel ( most recent) Lipid Panel     Component Value Date/Time   CHOL 141 07/11/2022 1136   TRIG 93 07/11/2022 1136   HDL 53 07/11/2022 1136   CHOLHDL 2.7 07/11/2022 1136   LDLCALC 71 07/11/2022 1136   LABVLDL 17 07/11/2022 1136      Lab Results  Component Value Date   TSH 1.170 07/11/2022   FREET4 1.42 07/11/2022    Febrile 13 2024 CT angio chest for PE showed IMPRESSION: 1. No PE, aneurysm or dissection. 2. Left upper quadrant cystic lesion with nodular  calcification presumably arising from the left adrenal gland that was only partially imaged on this examination.   July 31, 2022 CT angio chest PE Upper Abdomen: Fluid attenuation lesion with mural nodular calcification noted in the region of the left adrenal gland which was only partially imaged on this examination. The portion that was imaged measures 7 cm.  Assessment & Plan:   1.  Left adrenal cystic mass lesion  2.  Hypertension   3.  Smoker  - Gerald Phillips  is being seen at a kind request of Del Newman Nip, [place], FNP. - I have reviewed his available adrenal imaging studies as well as his recent baseline endocrine workup.  It appears that this adrenal mass lesion observed since 2008 is not functioning.   However, due to the sheer size of the lesion, he may benefit from surgical exploration.  I discussed this option with him and that he is willing to consult with Dr. Darnell Level.   I will arrange for the referral and patient will be back after his surgery with repeat plasma metanephrines, a.m. cortisol, and CMP. His 24-hour urine metanephrines and catecholamines were normal.  His a.m. cortisol was abnormal as well as his 24-hour urine aldosterone.  He is advised to continue his current blood pressure medications. The patient was counseled on the dangers of tobacco use, and was advised to quit.  Reviewed strategies to maximize success, including removing cigarettes and smoking materials from environment.  - I did not initiate any new prescriptions today.  - he is advised to maintain close follow up with Del Nigel Berthold, FNP for primary care needs.   I spent  22  minutes  in the care of the patient today including review of labs from Thyroid Function, CMP, and other relevant labs ; imaging/biopsy records (current and previous including abstractions from other facilities); face-to-face time discussing  his lab results and symptoms, medications doses, his options of  short and long term treatment based on the latest standards of care / guidelines;   and documenting the encounter.  Martie Lee  participated in the discussions, expressed understanding, and voiced agreement with the above plans.  All questions were answered to his satisfaction. he is encouraged to contact clinic should he have any questions or concerns prior to his return visit.   Follow up plan: Return in about 8 weeks (around 01/16/2023), or After his surgery - Dr. Gerrit Friends, for F/U with Pre-visit Labs, F/U with Labs after Surgery.   Marquis Lunch, MD [facility] [address] [place], [place] [address] Phone: [phone]  Fax: [phone]     11/21/2022, 11:25 AM  This note was partially dictated with voice recognition software. Similar sounding words can be transcribed inadequately or may not  be corrected upon review.

## 2022-11-26 ENCOUNTER — Telehealth: Payer: PRIVATE HEALTH INSURANCE | Admitting: Physician Assistant

## 2022-11-26 ENCOUNTER — Ambulatory Visit (INDEPENDENT_AMBULATORY_CARE_PROVIDER_SITE_OTHER): Payer: PRIVATE HEALTH INSURANCE | Admitting: Clinical

## 2022-11-26 DIAGNOSIS — M546 Pain in thoracic spine: Secondary | ICD-10-CM | POA: Diagnosis not present

## 2022-11-26 DIAGNOSIS — F411 Generalized anxiety disorder: Secondary | ICD-10-CM | POA: Diagnosis not present

## 2022-11-26 DIAGNOSIS — F4310 Post-traumatic stress disorder, unspecified: Secondary | ICD-10-CM | POA: Diagnosis not present

## 2022-11-26 MED ORDER — METHYLPREDNISOLONE 4 MG PO TBPK
ORAL_TABLET | ORAL | 0 refills | Status: DC
Start: 2022-11-26 — End: 2023-02-07

## 2022-11-26 NOTE — Progress Notes (Signed)
Virtual Visit via Video Note  I connected with Gerald Phillips on 11/26/22 at 10:00 AM EDT by a video enabled telemedicine application and verified that I am speaking with the correct person using two identifiers.  Location: Patient: Home Provider: Office   I discussed the limitations of evaluation and management by telemedicine and the availability of in person appointments. The patient expressed understanding and agreed to proceed.    THERAPIST PROGRESS NOTE   Session Time: 10:00 AM-10:20  AM   Participation Level: Active   Behavioral Response: CasualAlertAnxious   Type of Therapy: Individual Therapy   Treatment Goals addressed: Coping Anger Management for PTSD/GAD   Interventions: CBT, Motivational Interviewing, Solution Focused and Supportive   Summary: Gerald Phillips. Hacking is a 42 y.o. male who presents with GAD and PTSD. The OPT therapist worked with the patient for his scheduled session. The OPT therapist utilized Motivational Interviewing to assist in creating therapeutic repore. The patient spoke about having a more limited session for today as he was at the ER with his partner who a few days ago had got bitten by a snake and the patient noted unfortunately the wound over the course of the past week has gotten more severe leading them this morning to ED/Urgent Care for her. The patient elected not to reschedule but did note need for a shorter session due to the current circumstances. The patient spoke about his MH symptoms over the course of the past few weeks and noted his episodes have been connected to his current health health problems. The patient is currently wearing a heart monitor and will be following up with his Cardiologist to review result information from wearing the monitor. The patient additionally will be potentially needing surgery to remove a cyst that is sitting on his adrenal glad and spoke about his willingness based on the size to have it surgically removed and  will be following up with his health professional within the next week for the next step in this process. The patient spoke about  implementing coping including music/faith/and journaling. The patient spoke about his ongoing involvement with his church and exercising . The OPT therapist utilized Cognitive Behavioral Therapy through cognitive restructuring as well as worked with the patient on  leveraging coping strategies to assist in management of PTSD/GAD. The patient continues to utlize his existing support network going out on a regular basis with his sister in the community. The patient spoke on his progressive work towards his current goal of being able to go into stores in the community shop and leave without having to cut it short due to his Anxiety. The patient overviewed his partner current medical situation as a stressor, however, verbalized feeling confident she would be released on a antibiotic.    Suicidal/Homicidal: Nowithout intent/plan   Therapist Response: The OPT therapist worked with the patient for the patients scheduled session. The patient was engaged in his session and gave feedback in relation to triggers, symptoms, and behavior responses over the past few weeks. The OPT therapist worked with the patient utilizing an in session Cognitive Behavioral Therapy exercise. The patient was responsive in the session and verbalized," Things overall have been going better the episodes I have had have been connected to my physical health, but I am working with my doctor and my cardiologist and I am wearing this heart monitor I just have started that 4 days ago and I have to wear it for a few weeks , and if my doctor says  we need to remove the cyst I am ok with that," The OPT therapist worked with the patient overviewing his support symptoms and  implementation of coping strategies including leveraging seasonal coping strategies connected to hobbies of cooking, fishing and exercise within his  physical health limits. The OPT therapist worked with the patient overviewing appointments listed in the patients MyChart.  The OPT therapist will continue treatment work with the patient in his next scheduled session.     Plan: Return again in 2/3 weeks.   Diagnosis:      Axis I: GAD /PTSD                           Axis II: No diagnosis     Collaboration of Care: No additional collaboration of care.    Patient/Guardian was advised Release of Information must be obtained prior to any record release in order to collaborate their care with an outside provider. Patient/Guardian was advised if they have not already done so to contact the registration department to sign all necessary forms in order for [place] to release information regarding their care.    Consent: Patient/Guardian gives verbal consent for treatment and assignment of benefits for services provided during this visit. Patient/Guardian expressed understanding and agreed to proceed.    I discussed the assessment and treatment plan with the patient. The patient was provided an opportunity to ask questions and all were answered. The patient agreed with the plan and demonstrated an understanding of the instructions.   The patient was advised to call back or seek an in-person evaluation if the symptoms worsen or if the condition fails to improve as anticipated.   I provided 20 minutes of non-face-to-face time during this encounter.   Winfred Burn, LCSW    11/26/2022

## 2022-11-26 NOTE — Patient Instructions (Signed)
Gerald Phillips, thank you for joining Margaretann Loveless, PA-C for today's virtual visit.  While this provider is not your primary care provider (PCP), if your PCP is located in our provider database this encounter information will be shared with them immediately following your visit.   A [facility] MyChart account gives you access to today's visit and all your visits, tests, and labs performed at [facility] " click here if you don't have a [facility] MyChart account or go to [email or web address]  Consent: (Patient) ZIERE KUTI provided verbal consent for this virtual visit at the beginning of the encounter.  Current Medications:  Current Outpatient Medications:    methylPREDNISolone (MEDROL DOSEPAK) 4 MG TBPK tablet, 6 day taper; take as directed on package instructions, Disp: 21 tablet, Rfl: 0   amLODipine (NORVASC) 10 MG tablet, Take 1 tablet (10 mg total) by mouth daily., Disp: 30 tablet, Rfl: 2   chlorthalidone (HYGROTON) 25 MG tablet, Take 1 tablet (25 mg total) by mouth daily., Disp: 30 tablet, Rfl: 2   cyclobenzaprine (FLEXERIL) 10 MG tablet, Take 1 tablet (10 mg total) by mouth 3 (three) times daily as needed for muscle spasms., Disp: 30 tablet, Rfl: 0   KRILL OIL PO, Take by mouth., Disp: , Rfl:    LORazepam (ATIVAN) 1 MG tablet, Take 1 tablet (1 mg total) by mouth daily., Disp: 7 tablet, Rfl: 0   losartan (COZAAR) 25 MG tablet, Take 25 mg by mouth daily. (Patient not taking: Reported on 11/20/2022), Disp: , Rfl:    Magnesium 400 MG CAPS, Take 1 capsule by mouth daily., Disp: , Rfl:    metoprolol tartrate (LOPRESSOR) 100 MG tablet, Take 1 tablet (100 mg total) by mouth as directed. Two Hours Prior to CT Scan, Disp: 1 tablet, Rfl: 0   nitroGLYCERIN (NITROSTAT) 0.4 MG SL tablet, Place 1 tablet (0.4 mg total) under the tongue every 5 (five) minutes as needed for chest pain., Disp: 50 tablet, Rfl: 3   OVER THE COUNTER MEDICATION, Vitamin D3 with K2 (Patient not  taking: Reported on 11/01/2022), Disp: , Rfl:    OVER THE COUNTER MEDICATION, Take 1 tablet by mouth daily. Vitamin b6,folic acid,vitamin d and vitamin b12 (Patient not taking: Reported on 11/01/2022), Disp: , Rfl:    OVER THE COUNTER MEDICATION, Take 1 tablet by mouth daily. Scription BP (Patient not taking: Reported on 11/20/2022), Disp: , Rfl:    pantoprazole (PROTONIX) 20 MG tablet, Take 1 tablet (20 mg total) by mouth daily. (Patient not taking: Reported on 11/01/2022), Disp: 30 tablet, Rfl: 1   sertraline (ZOLOFT) 100 MG tablet, Take 100 mg by mouth daily. (Patient not taking: Reported on 11/01/2022), Disp: , Rfl:    Medications ordered in this encounter:  Meds ordered this encounter  Medications   methylPREDNISolone (MEDROL DOSEPAK) 4 MG TBPK tablet    Sig: 6 day taper; take as directed on package instructions    Dispense:  21 tablet    Refill:  0    Order Specific Question:   Supervising Provider    Answer:   Merrilee Jansky [[id number]]     *If you need refills on other medications prior to your next appointment, please contact your pharmacy*  Follow-Up: Call back or seek an in-person evaluation if the symptoms worsen or if the condition fails to improve as anticipated.  [facility] [phone]  Other Instructions   For an urgent face to face visit, [place] has eight urgent care  centers for your convenience:   NEW!! [facility] Urgent Care Center at [facility] Get Driving Directions [phone] [address], [address] [place], [address]    [facility] Health Urgent Care Center at [facility] Get Driving Directions [phone] [address] [place], [place] [address]   [facility] Health Urgent Care Center [facility]) Get Driving Directions [phone] [address] [place], [place] [address]  [facility] Health Urgent Care Center [facility]) Get Driving Directions [phone] [address] [place],  [place]  [address]  [facility] Health Urgent Care Center [facility] - at [employer]  [phone] [address] [place],  [place] [address]   [facility] Health Urgent Care at [facility] Get Driving Directions [phone] [address], [address] [place], [place] [address]   [facility] Health Urgent Care at [facility] Get Driving Directions  [phone] [address] [place], [place] [address]   [facility] Health Urgent Care at [facility] Directions [phone] [address] [place], [place] [address]   Thoracic Strain A thoracic strain is an injury to the muscles or tendons that attach to the upper part of your back behind your chest. Tendons are tissues that connect muscle to bone. This injury is sometimes called a mid-back strain. It happens when a muscle is stretched too far or overloaded. Thoracic strains can range from mild to severe. Mild strains may involve stretching a muscle or tendon without tearing it. These injuries may heal in 1-2 weeks. More severe strains involve tearing of muscle fibers or tendons. These will cause more pain and may take 6-8 weeks to heal. What are the causes? This condition may be caused by: Trauma, such as a fall or a hit to the body. Twisting or overstretching the back. This may result from doing activities that take a lot of energy, such as lifting heavy objects. In some cases, the cause may not be known. What increases the risk? This injury is more common in: Athletes. People with obesity. What are the signs or symptoms? The main symptom of this condition is pain in the middle back, especially with movement. Other symptoms include: Stiffness or limited range of motion. Sudden muscle tightening (spasms). How is this diagnosed? This condition may be diagnosed based on: Your symptoms. Your medical history. A physical exam. Imaging tests, such as X-rays, a CT  scan, or an MRI. How is this treated? This condition may be treated with: Resting the injured area. Applying heat and cold to the injured area. Over-the-counter medicines for pain and inflammation, such as NSAIDs. Prescription pain medicine or muscle relaxants. These may be needed for a short time. Doing exercises to improve movement and strength in your back (physical therapy). Treatments applied to the painful area, such as: Electrical stimulation. Stimulating the muscle with small needles (dry needling). Injections of medicine (trigger point injections). Follow these instructions at home: Managing pain, stiffness, and swelling     If told, put ice on the injured area. Put ice in a plastic bag. Place a towel between your skin and the bag. Leave the ice on for 20 minutes, 2-3 times a day. If told, apply heat to the affected area as often as told by your health care provider. Use the heat source that your provider recommends, such as a moist heat pack or a heating pad. Place a towel between your skin and the heat source. Leave the heat on for 20-30 minutes. If  your skin turns bright red, remove the ice or heat right away to prevent skin damage. The risk of damage is higher if you cannot feel pain, heat, or cold. Activity Rest and return to your normal activities as told by your provider. Ask your provider what activities are safe for you. Do exercises as told by your provider. Medicines Take over-the-counter and prescription medicines only as told by your provider. Ask your provider if the medicine prescribed to you: Requires you to avoid driving or using machinery. Can cause constipation. You may need to take these actions to prevent or treat constipation: Drink enough fluid to keep your pee (urine) pale yellow. Take over-the-counter or prescription medicines. Eat foods that are high in fiber, such as beans, whole grains, and fresh fruits and vegetables. Limit foods that are high  in fat and processed sugars, such as fried or sweet foods. General instructions Do not use any products that contain nicotine or tobacco. These products include cigarettes, chewing tobacco, and vaping devices, such as e-cigarettes. If you need help quitting, ask your provider. Keep all follow-up visits. Your provider will monitor your injury and activity. How is this prevented? To prevent a future mid-back injury: Always warm up before physical activity or sports. Cool down and stretch after being active. Use correct form when playing sports and lifting heavy objects. Bend your knees before you lift heavy objects. Use good posture when sitting and standing. Stay physically fit and maintain a healthy weight. Do at least 150 minutes of moderate-intensity exercise each week, such as brisk walking or water aerobics. Do strength exercises at least 2 times each week. Contact a health care provider if: Your pain is not helped by medicine. Your pain or stiffness is getting worse. You develop pain or stiffness in your neck or lower back. Get help right away if: You have shortness of breath. You have chest pain. You have numbness, tingling, or weakness in your legs. You are not able to control when you pee (urinary incontinence). These symptoms may be an emergency. Get help right away. Call 911. Do not wait to see if the symptoms will go away. Do not drive yourself to the hospital. This information is not intended to replace advice given to you by your health care provider. Make sure you discuss any questions you have with your health care provider. Document Revised: 02/11/2022 Document Reviewed: 02/11/2022 Elsevier Patient Education  2023 Elsevier Inc.    If you have been instructed to have an in-person evaluation today at a local Urgent Care facility, please use the link below. It will take you to a list of all of our available [facility], including address, phone number and  hours of operation. Please do not delay care.  [facility]  If you or a family member do not have a primary care provider, use the link below to schedule a visit and establish care. When you choose a [facility] primary care physician or advanced practice provider, you gain a long-term partner in health. Find a Primary Care Provider  Learn more about [facility]'s in-office and virtual care options: [facility] - Get Care Now
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
## 2022-11-29 ENCOUNTER — Ambulatory Visit
Admission: EM | Admit: 2022-11-29 | Discharge: 2022-11-29 | Disposition: A | Discharge status: Home / Self Care | Payer: Medicaid Other | Attending: Nurse Practitioner | Admitting: Nurse Practitioner

## 2022-11-29 DIAGNOSIS — R6884 Jaw pain: Secondary | ICD-10-CM | POA: Diagnosis not present

## 2022-11-29 DIAGNOSIS — R200 Anesthesia of skin: Secondary | ICD-10-CM

## 2022-11-29 DIAGNOSIS — R079 Chest pain, unspecified: Secondary | ICD-10-CM | POA: Diagnosis not present

## 2022-11-29 NOTE — ED Notes (Signed)
EKG order placed. EKG performed by CMA. NP at bedside.

## 2022-11-29 NOTE — ED Triage Notes (Signed)
Pt c/o chest pain pt states he is currently wearing a Holter monitor for palpitations. T states the pain starts in his chest and he can feel it in his back. Onset today.
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
## 2022-11-29 NOTE — Discharge Instructions (Addendum)
Please go directly to the ER for further evaluation and management of your symptoms 

## 2022-11-29 NOTE — ED Notes (Signed)
Patient is being discharged from the Urgent Care and sent to the Emergency Department via POV . Per NP, patient is in need of higher level of care due to left jaw pain. Patient is aware and verbalizes understanding of plan of care.  Vitals:   11/29/22 1610  BP: (!) 149/99  Pulse: 75  Resp: 13  Temp: 99 F (37.2 C)  SpO2: 98%

## 2022-11-30 DIAGNOSIS — H5213 Myopia, bilateral: Secondary | ICD-10-CM | POA: Diagnosis not present

## 2022-12-04 ENCOUNTER — Ambulatory Visit (INDEPENDENT_AMBULATORY_CARE_PROVIDER_SITE_OTHER): Payer: PRIVATE HEALTH INSURANCE | Admitting: Family Medicine

## 2022-12-04 ENCOUNTER — Encounter: Payer: Self-pay | Admitting: Family Medicine

## 2022-12-04 VITALS — BP 142/82 | HR 77 | Ht 70.0 in | Wt 221.0 lb

## 2022-12-04 DIAGNOSIS — H9312 Tinnitus, left ear: Secondary | ICD-10-CM

## 2022-12-04 DIAGNOSIS — M542 Cervicalgia: Secondary | ICD-10-CM | POA: Insufficient documentation

## 2022-12-04 DIAGNOSIS — R42 Dizziness and giddiness: Secondary | ICD-10-CM

## 2022-12-04 MED ORDER — BACLOFEN 10 MG PO TABS
10.0000 mg | ORAL_TABLET | Freq: Every evening | ORAL | 0 refills | Status: AC
Start: 2022-12-04 — End: ?

## 2022-12-04 NOTE — Assessment & Plan Note (Addendum)
He reports not hearing from the referral placed previously by PCP Referral placed to ENT

## 2022-12-04 NOTE — Assessment & Plan Note (Addendum)
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 He complains of mild discomfort and stiffness with right and left lateral rotation, flexion, and extension of the neck He does follow up with a chiropractor once every 2 weeks. Pain has been ongoing for about 7 to 8 years, as reported by the patient No fever, unintentional weight loss, or red flag symptoms were reported No bowel bladder incontinence was reported, nor hip griddle pain No obvious abnormalities in his gait were noted. No reports of lower extremity weakness  Referral placed to physical therapy Will treat with baclofen 10 mg nightly  Pending imaging study of the cervical spine Will follow-up in 3 weeks with PCP

## 2022-12-04 NOTE — Patient Instructions (Addendum)
I appreciate the opportunity to provide care to you today!    Follow up:  3 weeks with PCP   Please start taking baclofen 10 mg at bedtime daily for your neck pain  Please stop by [facility] hospital anytime to get an x-ray of of your neck  Please try to consume at least 64 ounces of water daily.  Referrals today- Physical Therapy   Please continue to a heart-healthy diet and increase your physical activities. Try to exercise for at least five days a week.      It was a pleasure to see you and I look forward to continuing to work together on your health and well-being. Please do not hesitate to call the office if you need care or have questions about your care.   Have a wonderful day and week. With Gratitude, Gilmore Laroche MSN, FNP-BC

## 2022-12-04 NOTE — Assessment & Plan Note (Signed)
Complains of dizziness that is multifactorial Encouraged to increase his fluid consumption verbalizes importance of daily No dizziness reported today We will follow-up with referral placed to ENT

## 2022-12-04 NOTE — Progress Notes (Signed)
Established Patient Office Visit  Subjective:  Patient ID: Gerald Phillips, male    DOB: 04-Nov-1980  Age: 42 y.o. MRN: [id number]  CC:  Chief Complaint  Patient presents with   Neck Pain    Pt reports neck and dizziness. Pt reports seeing chiropractor, uses ice on neck, dizziness happens when he has a busy day. Would like to be referred to neurologist. Has had imaging done by chiropractor 3-4 months ago    HPI Gerald Phillips is a 42 y.o. male  presents with complaints of neck pain and dizziness.  Of note the patient does have a history of vertigo and tinnitus.  He admits to minimal fluid intake.    Past Medical History:  Diagnosis Date   Anxiety    GERD (gastroesophageal reflux disease)    Gout    Hypertension     Past Surgical History:  Procedure Laterality Date   LAPAROTOMY N/A 08/25/2019   Procedure: EXPLORATORY LAPAROTOMY, RENAL EXPLORATION CLOSURE OF RETROPERITONEUM;  Surgeon: Manus Rudd, MD;  Location: [facility] OR;  Service: General;  Laterality: N/A;    Family History  Problem Relation Age of Onset   Cancer Maternal Grandmother    Heart attack Maternal Grandfather     Social History   Socioeconomic History   Marital status: Married    Spouse name: Not on file   Number of children: Not on file   Years of education: Not on file   Highest education level: Not on file  Occupational History   Not on file  Tobacco Use   Smoking status: Every Day    Packs/day: .5    Types: Cigars, Cigarettes   Smokeless tobacco: Never  Vaping Use   Vaping Use: Never used  Substance and Sexual Activity   Alcohol use: Not Currently    Comment: occasionally   Drug use: Not Currently   Sexual activity: Not on file  Other Topics Concern   Not on file  Social History Narrative   ** Merged History Encounter **       Social Determinants of Health   Financial Resource Strain: Not on file  Food Insecurity: Not on file  Transportation Needs: Not on file  Physical Activity: Not  on file  Stress: Not on file  Social Connections: Not on file  Intimate Partner Violence: Not on file    Outpatient Medications Prior to Visit  Medication Sig Dispense Refill   amLODipine (NORVASC) 10 MG tablet Take 1 tablet (10 mg total) by mouth daily. 30 tablet 2   chlorthalidone (HYGROTON) 25 MG tablet Take 1 tablet (25 mg total) by mouth daily. 30 tablet 2   cyclobenzaprine (FLEXERIL) 10 MG tablet Take 1 tablet (10 mg total) by mouth 3 (three) times daily as needed for muscle spasms. 30 tablet 0   KRILL OIL PO Take by mouth.     LORazepam (ATIVAN) 1 MG tablet Take 1 tablet (1 mg total) by mouth daily. 7 tablet 0   losartan (COZAAR) 25 MG tablet Take 25 mg by mouth daily.     Magnesium 400 MG CAPS Take 1 capsule by mouth daily.     methylPREDNISolone (MEDROL DOSEPAK) 4 MG TBPK tablet 6 day taper; take as directed on package instructions 21 tablet 0   metoprolol tartrate (LOPRESSOR) 100 MG tablet Take 1 tablet (100 mg total) by mouth as directed. Two Hours Prior to CT Scan 1 tablet 0   nitroGLYCERIN (NITROSTAT) 0.4 MG SL tablet Place 1 tablet (0.4 mg  total) under the tongue every 5 (five) minutes as needed for chest pain. 50 tablet 3   OVER THE COUNTER MEDICATION Vitamin D3 with K2     OVER THE COUNTER MEDICATION Take 1 tablet by mouth daily. Vitamin b6,folic acid,vitamin d and vitamin b12     OVER THE COUNTER MEDICATION Take 1 tablet by mouth daily. Scription BP     pantoprazole (PROTONIX) 20 MG tablet Take 1 tablet (20 mg total) by mouth daily. 30 tablet 1   sertraline (ZOLOFT) 100 MG tablet Take 100 mg by mouth daily.     No facility-administered medications prior to visit.    Allergies  Allergen Reactions   Atarax [Hydroxyzine]     tremors   Penicillins     Reaction is unknown: Son has a reaction of hives and rash   Penicillins Hives    Did it involve swelling of the face/tongue/throat, SOB, or low BP? No Did it involve sudden or severe rash/hives, skin peeling, or any  reaction on the inside of your mouth or nose? N/A Did you need to seek medical attention at a hospital or doctor's office? N/A When did it last happen? Child        If all above answers are "NO", may proceed with cephalosporin use.    ROS Review of Systems  Constitutional:  Negative for fatigue and fever.  Eyes:  Negative for visual disturbance.  Respiratory:  Negative for chest tightness and shortness of breath.   Cardiovascular:  Negative for chest pain and palpitations.  Musculoskeletal:  Positive for neck pain.  Neurological:  Negative for dizziness and headaches.      Objective:    Physical Exam HENT:     Head: Normocephalic.     Right Ear: External ear normal.     Left Ear: External ear normal.     Nose: No congestion or rhinorrhea.     Mouth/Throat:     Mouth: Mucous membranes are moist.  Cardiovascular:     Rate and Rhythm: Regular rhythm.     Heart sounds: No murmur heard. Pulmonary:     Effort: No respiratory distress.     Breath sounds: Normal breath sounds.  Musculoskeletal:     Cervical back: No signs of trauma, rigidity, tenderness or crepitus. Pain with movement present. No muscular tenderness. Normal range of motion.  Neurological:     Mental Status: He is alert.     BP (!) 142/82   Pulse 77   Ht 5\' 10"  (1.778 m)   Wt 221 lb 0.6 oz (100.3 kg)   SpO2 98%   BMI 31.72 kg/m  Wt Readings from Last 3 Encounters:  12/04/22 221 lb 0.6 oz (100.3 kg)  11/21/22 224 lb 3.2 oz (101.7 kg)  11/20/22 225 lb (102.1 kg)    Lab Results  Component Value Date   TSH 1.170 07/11/2022   Lab Results  Component Value Date   WBC 5.6 10/15/2022   HGB 16.6 10/15/2022   HCT 48.7 10/15/2022   MCV 97.0 10/15/2022   PLT 181 10/15/2022   Lab Results  Component Value Date   NA 137 10/15/2022   K 3.3 (L) 10/15/2022   CO2 28 10/15/2022   GLUCOSE 98 10/15/2022   BUN 9 10/15/2022   CREATININE 1.05 10/15/2022   BILITOT 0.3 09/03/2022   ALKPHOS 58 09/03/2022   AST 20  09/03/2022   ALT 24 09/03/2022   PROT 7.2 09/03/2022   ALBUMIN 4.4 09/03/2022   CALCIUM 9.5 10/15/2022  ANIONGAP 9 10/15/2022   EGFR 88 07/11/2022   Lab Results  Component Value Date   CHOL 141 07/11/2022   Lab Results  Component Value Date   HDL 53 07/11/2022   Lab Results  Component Value Date   LDLCALC 71 07/11/2022   Lab Results  Component Value Date   TRIG 93 07/11/2022   Lab Results  Component Value Date   CHOLHDL 2.7 07/11/2022   Lab Results  Component Value Date   HGBA1C 6.0 (H) 07/11/2022      Assessment & Plan:  Neck pain without injury Assessment & Plan: 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 He complains of mild discomfort and stiffness with right and left lateral rotation, flexion, and extension of the neck He does follow up with a chiropractor once every 2 weeks. Pain has been ongoing for about 7 to 8 years, as reported by the patient No fever, unintentional weight loss, or red flag symptoms were reported No bowel bladder incontinence was reported, nor hip griddle pain No obvious abnormalities in his gait were noted. No reports of lower extremity weakness  Referral placed to physical therapy Will treat with baclofen 10 mg nightly  Pending imaging study of the cervical spine Will follow-up in 3 weeks with PCP   Orders: -     DG Cervical Spine Complete -     Ambulatory referral to Physical Therapy -     Baclofen; Take 1 tablet (10 mg total) by mouth at bedtime.  Dispense: 30 each; Refill: 0  Vertigo Assessment & Plan: Complains of dizziness that is multifactorial Encouraged to increase his fluid consumption verbalizes importance of daily No dizziness reported today We will follow-up with referral placed to ENT  Orders: -     Ambulatory referral to ENT  Tinnitus, left ear Assessment & Plan: He reports not hearing from the referral placed previously by  PCP Referral placed to ENT  Orders: -     Ambulatory referral to ENT  Tinnitus aurium, left -     Ambulatory referral to ENT    Follow-up: Return in about 3 weeks (around 12/25/2022), or neck pain.   Gilmore Laroche, FNP

## 2022-12-11 ENCOUNTER — Ambulatory Visit: Payer: PRIVATE HEALTH INSURANCE | Admitting: Family Medicine

## 2022-12-12 ENCOUNTER — Ambulatory Visit (HOSPITAL_COMMUNITY)
Admission: RE | Admit: 2022-12-12 | Discharge: 2022-12-12 | Disposition: A | Discharge status: Home / Self Care | Payer: No Typology Code available for payment source | Source: Ambulatory Visit | Attending: Family Medicine | Admitting: Family Medicine

## 2022-12-12 ENCOUNTER — Telehealth: Payer: Self-pay | Admitting: Family Medicine

## 2022-12-12 DIAGNOSIS — M542 Cervicalgia: Secondary | ICD-10-CM | POA: Insufficient documentation

## 2022-12-12 NOTE — Telephone Encounter (Signed)
Patient is calling back saying he went to the radiology at hospital to do his xrays and do not have the neck xray's, was seen last week and ordered. Please call patient back at [phone] and let him know what he need to do. Per patient he does not need to go through the ER department, he just needs neck xrays done.

## 2022-12-12 NOTE — Telephone Encounter (Signed)
Tried calling patient back. Order is in for xrays.

## 2022-12-12 NOTE — Telephone Encounter (Signed)
Pt aware.

## 2022-12-13 DIAGNOSIS — G4733 Obstructive sleep apnea (adult) (pediatric): Secondary | ICD-10-CM | POA: Diagnosis not present

## 2022-12-17 ENCOUNTER — Telehealth (HOSPITAL_COMMUNITY): Payer: Self-pay | Admitting: Emergency Medicine

## 2022-12-17 DIAGNOSIS — G4730 Sleep apnea, unspecified: Secondary | ICD-10-CM | POA: Diagnosis not present

## 2022-12-17 DIAGNOSIS — I10 Essential (primary) hypertension: Secondary | ICD-10-CM | POA: Diagnosis not present

## 2022-12-17 DIAGNOSIS — F411 Generalized anxiety disorder: Secondary | ICD-10-CM | POA: Diagnosis not present

## 2022-12-17 DIAGNOSIS — M542 Cervicalgia: Secondary | ICD-10-CM | POA: Diagnosis not present

## 2022-12-17 NOTE — Telephone Encounter (Signed)
Reaching out to patient to offer assistance regarding upcoming cardiac imaging study; pt verbalizes understanding of appt date/time, parking situation and where to check in, pre-test NPO status and medications ordered, and verified current allergies; name and call back number provided for further questions should they arise Rockwell Alexandria RN Navigator Cardiac Imaging [facility] [phone] office [phone] cell  100mg  metoprolol + 1mg  ativan

## 2022-12-18 ENCOUNTER — Ambulatory Visit (HOSPITAL_COMMUNITY)
Admission: RE | Admit: 2022-12-18 | Discharge: 2022-12-18 | Disposition: A | Discharge status: Home / Self Care | Payer: No Typology Code available for payment source | Source: Ambulatory Visit | Attending: Internal Medicine | Admitting: Internal Medicine

## 2022-12-18 DIAGNOSIS — R079 Chest pain, unspecified: Secondary | ICD-10-CM | POA: Insufficient documentation

## 2022-12-18 DIAGNOSIS — R072 Precordial pain: Secondary | ICD-10-CM | POA: Diagnosis not present

## 2022-12-18 MED ORDER — NITROGLYCERIN 0.4 MG SL SUBL
0.8000 mg | SUBLINGUAL_TABLET | Freq: Once | SUBLINGUAL | Status: AC
  Administered 2022-12-18: 0.8 mg via SUBLINGUAL

## 2022-12-18 MED ORDER — IOHEXOL 350 MG/ML SOLN
100.0000 mL | Freq: Once | INTRAVENOUS | Status: AC | PRN
  Administered 2022-12-18: 100 mL via INTRAVENOUS

## 2022-12-18 MED ORDER — NITROGLYCERIN 0.4 MG SL SUBL
SUBLINGUAL_TABLET | SUBLINGUAL | Status: AC
  Filled 2022-12-18: qty 2

## 2022-12-19 NOTE — Progress Notes (Signed)
Please inform the patient that his most recent x-ray of his cervical spine showed no recent fracture, dislocation, or significant bony injuries. he does have mild to moderate age-related wear-and-tear changes in the spine specifically noted at the 6 and 7 cervical vertebrae in the neck which can cause symptoms like neck pain, stiffness, or reduced range of motion.  I recommend following-up on the physical therapy referral placed and taking over-the-counter analgesic along with his baclofen.

## 2022-12-26 ENCOUNTER — Ambulatory Visit (HOSPITAL_COMMUNITY): Payer: PRIVATE HEALTH INSURANCE | Admitting: Clinical

## 2022-12-26 ENCOUNTER — Telehealth (HOSPITAL_COMMUNITY): Payer: Self-pay | Admitting: Clinical

## 2022-12-26 NOTE — Telephone Encounter (Signed)
The patient was a no show for his in person session.

## 2022-12-31 ENCOUNTER — Encounter: Payer: Self-pay | Admitting: Family Medicine

## 2022-12-31 ENCOUNTER — Ambulatory Visit: Payer: Medicaid Other | Admitting: Family Medicine

## 2022-12-31 VITALS — BP 152/96 | HR 77 | Temp 98.6°F | Ht 70.0 in | Wt 218.0 lb

## 2022-12-31 DIAGNOSIS — R42 Dizziness and giddiness: Secondary | ICD-10-CM

## 2022-12-31 MED ORDER — PROMETHAZINE HCL 12.5 MG PO TABS
12.5000 mg | ORAL_TABLET | Freq: Four times a day (QID) | ORAL | 0 refills | Status: DC | PRN
Start: 2022-12-31 — End: 2023-03-28

## 2022-12-31 NOTE — Progress Notes (Signed)
Patient Office Visit   Subjective   Patient ID: Gerald Phillips, male    DOB: 1981/03/06  Age: 42 y.o. MRN: [id number]  CC:  Chief Complaint  Patient presents with   Follow-up    3w follow up Dizzy/vertigo would like to get referral ENT Neck pain    HPI Gerald Phillips 42 year old male, presents to the clinic for worsening vertigo for the past few months.He  has a past medical history of Anxiety, GERD (gastroesophageal reflux disease), Gout, and Hypertension.For the details of today's visit, please refer to assessment and plan.   HPI    Outpatient Encounter Medications as of 12/31/2022  Medication Sig   amLODipine (NORVASC) 10 MG tablet Take 1 tablet (10 mg total) by mouth daily.   baclofen (LIORESAL) 10 MG tablet Take 1 tablet (10 mg total) by mouth at bedtime.   chlorthalidone (HYGROTON) 25 MG tablet Take 1 tablet (25 mg total) by mouth daily.   cyclobenzaprine (FLEXERIL) 10 MG tablet Take 1 tablet (10 mg total) by mouth 3 (three) times daily as needed for muscle spasms.   KRILL OIL PO Take by mouth.   LORazepam (ATIVAN) 1 MG tablet Take 1 tablet (1 mg total) by mouth daily.   Magnesium 400 MG CAPS Take 1 capsule by mouth daily.   nitroGLYCERIN (NITROSTAT) 0.4 MG SL tablet Place 1 tablet (0.4 mg total) under the tongue every 5 (five) minutes as needed for chest pain.   OVER THE COUNTER MEDICATION Vitamin D3 with K2   OVER THE COUNTER MEDICATION Take 1 tablet by mouth daily. Vitamin b6,folic acid,vitamin d and vitamin b12   OVER THE COUNTER MEDICATION Take 1 tablet by mouth daily. Scription BP   pantoprazole (PROTONIX) 20 MG tablet Take 1 tablet (20 mg total) by mouth daily.   promethazine (PHENERGAN) 12.5 MG tablet Take 1 tablet (12.5 mg total) by mouth every 6 (six) hours as needed for nausea or vomiting.   losartan (COZAAR) 25 MG tablet Take 25 mg by mouth daily. (Patient not taking: Reported on 12/31/2022)   methylPREDNISolone (MEDROL DOSEPAK) 4 MG TBPK tablet 6 day taper;  take as directed on package instructions (Patient not taking: Reported on 12/31/2022)   metoprolol tartrate (LOPRESSOR) 100 MG tablet Take 1 tablet (100 mg total) by mouth as directed. Two Hours Prior to CT Scan (Patient not taking: Reported on 12/31/2022)   sertraline (ZOLOFT) 100 MG tablet Take 100 mg by mouth daily. (Patient not taking: Reported on 12/31/2022)   No facility-administered encounter medications on file as of 12/31/2022.    Past Surgical History:  Procedure Laterality Date   LAPAROTOMY N/A 08/25/2019   Procedure: EXPLORATORY LAPAROTOMY, RENAL EXPLORATION CLOSURE OF RETROPERITONEUM;  Surgeon: Manus Rudd, MD;  Location: [facility] OR;  Service: General;  Laterality: N/A;    Review of Systems  Constitutional:  Negative for chills and fever.  Respiratory:  Negative for shortness of breath.   Cardiovascular:  Negative for chest pain.  Gastrointestinal:  Negative for abdominal pain.  Neurological:  Positive for dizziness.      Objective    BP (!) 152/96   Pulse 77   Temp 98.6 F (37 C) (Oral)   Ht 5\' 10"  (1.778 m)   Wt 218 lb (98.9 kg)   SpO2 97%   BMI 31.28 kg/m   Physical Exam Vitals reviewed.  Constitutional:      General: He is not in acute distress.    Appearance: Normal appearance. He is not ill-appearing,  toxic-appearing or diaphoretic.  HENT:     Head: Normocephalic.  Eyes:     General:        Right eye: No discharge.        Left eye: No discharge.     Conjunctiva/sclera: Conjunctivae normal.  Cardiovascular:     Rate and Rhythm: Normal rate.     Pulses: Normal pulses.     Heart sounds: Normal heart sounds.  Pulmonary:     Effort: Pulmonary effort is normal. No respiratory distress.     Breath sounds: Normal breath sounds.  Musculoskeletal:        General: Normal range of motion.     Cervical back: Normal range of motion.  Skin:    General: Skin is warm and dry.     Capillary Refill: Capillary refill takes less than 2 seconds.  Neurological:      General: No focal deficit present.     Mental Status: He is alert and oriented to person, place, and time.     Coordination: Coordination normal.     Gait: Gait normal.  Psychiatric:        Mood and Affect: Mood normal.        Behavior: Behavior normal.       Assessment & Plan:  Vertigo Assessment & Plan: Started phenergan 12.5 mg PRN Referral placed to ENT for further evaluation   Orders: -     Promethazine HCl; Take 1 tablet (12.5 mg total) by mouth every 6 (six) hours as needed for nausea or vomiting.  Dispense: 30 tablet; Refill: 0 -     Ambulatory referral to ENT    Return in about 1 week (around 01/07/2023) for Left upper quardant pain.   Cruzita Lederer Newman Nip, FNP

## 2022-12-31 NOTE — Assessment & Plan Note (Signed)
Started phenergan 12.5 mg PRN Referral placed to ENT for further evaluation

## 2022-12-31 NOTE — Patient Instructions (Signed)
        Great to see you today.   - Please take medications as prescribed. - Follow up with your primary health provider if any health concerns arises. - If symptoms worsen please contact your primary care provider and/or visit the emergency department.  

## 2023-01-08 ENCOUNTER — Ambulatory Visit: Payer: PRIVATE HEALTH INSURANCE | Admitting: Family Medicine

## 2023-01-14 ENCOUNTER — Other Ambulatory Visit: Payer: Self-pay

## 2023-01-14 ENCOUNTER — Ambulatory Visit (HOSPITAL_COMMUNITY): Payer: No Typology Code available for payment source | Attending: Family Medicine | Admitting: Physical Therapy

## 2023-01-14 DIAGNOSIS — M542 Cervicalgia: Secondary | ICD-10-CM | POA: Diagnosis not present
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
## 2023-01-16 ENCOUNTER — Ambulatory Visit: Payer: PRIVATE HEALTH INSURANCE | Admitting: Family Medicine

## 2023-01-16 DIAGNOSIS — M542 Cervicalgia: Secondary | ICD-10-CM | POA: Diagnosis not present

## 2023-01-16 DIAGNOSIS — F4312 Post-traumatic stress disorder, chronic: Secondary | ICD-10-CM | POA: Diagnosis not present

## 2023-01-16 DIAGNOSIS — I10 Essential (primary) hypertension: Secondary | ICD-10-CM | POA: Diagnosis not present

## 2023-01-17 ENCOUNTER — Ambulatory Visit (HOSPITAL_COMMUNITY): Payer: No Typology Code available for payment source | Admitting: Physical Therapy

## 2023-01-17 DIAGNOSIS — M542 Cervicalgia: Secondary | ICD-10-CM
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
## 2023-01-23 ENCOUNTER — Ambulatory Visit (HOSPITAL_COMMUNITY): Payer: No Typology Code available for payment source | Admitting: Physical Therapy

## 2023-01-23 DIAGNOSIS — M542 Cervicalgia: Secondary | ICD-10-CM
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
## 2023-01-29 ENCOUNTER — Ambulatory Visit (HOSPITAL_COMMUNITY): Payer: No Typology Code available for payment source | Admitting: Physical Therapy

## 2023-01-31 ENCOUNTER — Ambulatory Visit (HOSPITAL_COMMUNITY): Payer: No Typology Code available for payment source | Admitting: Physical Therapy

## 2023-01-31 DIAGNOSIS — M542 Cervicalgia: Secondary | ICD-10-CM | POA: Diagnosis not present
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
## 2023-02-03 ENCOUNTER — Ambulatory Visit (HOSPITAL_COMMUNITY): Payer: No Typology Code available for payment source | Admitting: Physical Therapy

## 2023-02-03 DIAGNOSIS — M542 Cervicalgia: Secondary | ICD-10-CM | POA: Diagnosis not present
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
## 2023-02-04 ENCOUNTER — Ambulatory Visit (HOSPITAL_COMMUNITY): Payer: No Typology Code available for payment source | Admitting: Physical Therapy

## 2023-02-04 ENCOUNTER — Telehealth (HOSPITAL_COMMUNITY): Payer: Self-pay | Admitting: Physical Therapy

## 2023-02-04 NOTE — Telephone Encounter (Signed)
Pt did not show.  NS#1; Unable to reach by phone.  Lurena Nida, PTA/CLT [facility] Ph: [phone]

## 2023-02-05 ENCOUNTER — Other Ambulatory Visit: Payer: Self-pay

## 2023-02-05 ENCOUNTER — Emergency Department (HOSPITAL_COMMUNITY)
Admission: EM | Admit: 2023-02-05 | Discharge: 2023-02-05 | Discharge status: Against Medical Advice | Payer: No Typology Code available for payment source | Attending: Emergency Medicine | Admitting: Emergency Medicine

## 2023-02-05 ENCOUNTER — Encounter (HOSPITAL_COMMUNITY): Payer: Self-pay | Admitting: Emergency Medicine

## 2023-02-05 ENCOUNTER — Emergency Department (HOSPITAL_COMMUNITY): Payer: No Typology Code available for payment source

## 2023-02-05 DIAGNOSIS — R079 Chest pain, unspecified: Secondary | ICD-10-CM | POA: Diagnosis present

## 2023-02-05 DIAGNOSIS — R12 Heartburn: Secondary | ICD-10-CM | POA: Insufficient documentation

## 2023-02-05 DIAGNOSIS — R200 Anesthesia of skin: Secondary | ICD-10-CM | POA: Diagnosis not present

## 2023-02-05 DIAGNOSIS — Z5321 Procedure and treatment not carried out due to patient leaving prior to being seen by health care provider: Secondary | ICD-10-CM | POA: Diagnosis not present

## 2023-02-05 DIAGNOSIS — R202 Paresthesia of skin: Secondary | ICD-10-CM | POA: Insufficient documentation

## 2023-02-05 NOTE — ED Triage Notes (Signed)
Pt complains of chest pain and left arm numbness started earlier today. Pt states has been bleaching a lot and thought it was heart burn but the left arm tingling tingling has been worse.

## 2023-02-06 ENCOUNTER — Telehealth: Payer: Self-pay

## 2023-02-06 NOTE — Transitions of Care (Post Inpatient/ED Visit) (Signed)
02/06/2023  Name: Gerald Phillips MRN: [id number] DOB: September 22, 1980  Today's TOC FU Call Status: Today's TOC FU Call Status:: Successful TOC FU Call Completed TOC FU Call Complete Date: 02/06/23  Transition Care Management Follow-up Telephone Call Date of Discharge: 02/05/23 [facility] Type of Discharge: Emergency Department Reason for ED Visit: Cardiac Conditions Cardiac Conditions Diagnosis:  (Chest Pain) How have you been since you were released from the hospital?: Better Any questions or concerns?: No  Items Reviewed: Did you receive and understand the discharge instructions provided?: Yes Medications obtained,verified, and reconciled?: Yes (Medications Reviewed) Any new allergies since your discharge?: No Dietary orders reviewed?: Yes Do you have support at home?: Yes  Medications Reviewed Today: Medications Reviewed Today     Reviewed by Merleen Nicely, LPN (Licensed Practical Nurse) on 02/06/23 at 1007  Med List Status: <None>   Medication Order Taking? Sig Documenting Provider Last Dose Status Informant  amLODipine (NORVASC) 10 MG tablet [id number] Yes Take 1 tablet (10 mg total) by mouth daily. Del Newman Nip, Tenna Child, FNP Taking Active Self, Pharmacy Records  baclofen (LIORESAL) 10 MG tablet [id number] Yes Take 1 tablet (10 mg total) by mouth at bedtime. Gilmore Laroche, FNP Taking Active   chlorthalidone (HYGROTON) 25 MG tablet [id number] Yes Take 1 tablet (25 mg total) by mouth daily. Renne Crigler, PA-C Taking Active Self, Pharmacy Records  cyclobenzaprine (FLEXERIL) 10 MG tablet [id number] Yes Take 1 tablet (10 mg total) by mouth 3 (three) times daily as needed for muscle spasms. Delorse Lek, FNP Taking Active Self, Pharmacy Records  KRILL OIL PO [id number] Yes Take by mouth. [provider] Taking Active Self, Pharmacy Records  LORazepam (ATIVAN) 1 MG tablet [id number] Yes Take 1 tablet (1 mg total) by mouth daily. Achille Rich,  PA-C Taking Active   losartan (COZAAR) 25 MG tablet [id number] Yes Take 25 mg by mouth daily. [provider] Taking Active   Magnesium 400 MG CAPS [id number] Yes Take 1 capsule by mouth daily. [provider] Taking Active Self, Pharmacy Records  methylPREDNISolone (MEDROL DOSEPAK) 4 MG TBPK tablet [id number] Yes 6 day taper; take as directed on package instructions Margaretann Loveless, PA-C Taking Active   metoprolol tartrate (LOPRESSOR) 100 MG tablet [id number] Yes Take 1 tablet (100 mg total) by mouth as directed. Two Hours Prior to CT Scan Mallipeddi, Vishnu P, MD Taking Active   nitroGLYCERIN (NITROSTAT) 0.4 MG SL tablet [id number] Yes Place 1 tablet (0.4 mg total) under the tongue every 5 (five) minutes as needed for chest pain. Del Nigel Berthold, FNP Taking Active   OVER THE COUNTER MEDICATION [id number] Yes Vitamin D3 with K2 [provider] Taking Active Self, Pharmacy Records  OVER THE COUNTER MEDICATION [id number] Yes Take 1 tablet by mouth daily. Vitamin b6,folic acid,vitamin d and vitamin b12 [provider] Taking Active Self, Pharmacy Records  OVER THE COUNTER MEDICATION [id number] Yes Take 1 tablet by mouth daily. Scription BP [provider] Taking Active Self, Pharmacy Records  pantoprazole (PROTONIX) 20 MG tablet [id number] Yes Take 1 tablet (20 mg total) by mouth daily. Del Newman Nip, Tenna Child, FNP Taking Active   promethazine (PHENERGAN) 12.5 MG tablet [id number] Yes Take 1 tablet (12.5 mg total) by mouth every 6 (six) hours as needed for nausea or vomiting. Del Newman Nip, Tenna Child, FNP Taking Active   sertraline (ZOLOFT) 100 MG tablet [id number] Yes Take 100 mg by mouth daily. [provider] Taking Active   Med List Note (  Abigail Miyamoto 10/31/13 0865): No preferred pharmacy            Home Care and Equipment/Supplies: Were Home Health Services Ordered?: NA Any new equipment or medical supplies ordered?:  NA  Functional Questionnaire: Do you need assistance with bathing/showering or dressing?: No Do you need assistance with meal preparation?: No Do you need assistance with eating?: No Do you have difficulty maintaining continence: No Do you need assistance with getting out of bed/getting out of a chair/moving?: No Do you have difficulty managing or taking your medications?: No  Follow up appointments reviewed: PCP Follow-up appointment confirmed?: Yes (pt scheduled for 02-12-23 but wants to be seen tomorrow 02-07-23 if able to be worked in the schedule- he does not feel that the ER took the time for him that was needed- and is wanting EKG done-  front desk to call pt to schedule appt tomorrow if possible) Date of PCP follow-up appointment?: 02/12/23 Follow-up Provider: Henreitta Leber FNP [facility] Follow-up appointment confirmed?: NA Do you need transportation to your follow-up appointment?: No Do you understand care options if your condition(s) worsen?: Yes-patient verbalized understanding    SIGNATURE  Woodfin Ganja LPN [facility] Nurse Health Advisor Direct Dial [phone]

## 2023-02-07 ENCOUNTER — Encounter: Payer: Self-pay | Admitting: Family Medicine

## 2023-02-07 ENCOUNTER — Ambulatory Visit: Payer: Medicaid Other | Admitting: Family Medicine

## 2023-02-07 VITALS — BP 138/86 | HR 80 | Ht 70.0 in | Wt 212.0 lb

## 2023-02-07 DIAGNOSIS — Z13220 Encounter for screening for lipoid disorders: Secondary | ICD-10-CM

## 2023-02-07 DIAGNOSIS — I10 Essential (primary) hypertension: Secondary | ICD-10-CM | POA: Diagnosis not present

## 2023-02-07 DIAGNOSIS — R7303 Prediabetes: Secondary | ICD-10-CM

## 2023-02-07 DIAGNOSIS — E559 Vitamin D deficiency, unspecified: Secondary | ICD-10-CM | POA: Diagnosis not present

## 2023-02-07 DIAGNOSIS — M542 Cervicalgia: Secondary | ICD-10-CM

## 2023-02-07 DIAGNOSIS — M940 Chondrocostal junction syndrome [Tietze]: Secondary | ICD-10-CM

## 2023-02-07 MED ORDER — PREDNISONE 20 MG PO TABS: 20.0000 mg | ORAL_TABLET | Freq: Two times a day (BID) | ORAL | 0 refills | Status: AC

## 2023-02-07 NOTE — Progress Notes (Signed)
Patient Office Visit   Subjective   Patient ID: Gerald Phillips, male    DOB: 06-May-1981  Age: 42 y.o. MRN: [id number]  CC:  Chief Complaint  Patient presents with   Follow-up    Follow up from hospital visit on 02/05/23. Pt reports periodically still feeling the sharpness in his chest not as bad as it was.    HPI Gerald Phillips 42 year old male, presents to the clinic for ER follow up for chest painHe  has a past medical history of Anxiety, GERD (gastroesophageal reflux disease), Gout, and Hypertension.  Chest Pain  This is a recurrent problem. Chest pain occurs intermittently and has been waxing and waning since onset. The pain is present in the substernal region. The pain is at a severity of 4/10. Quality: stinging sensation. The pain radiates to the left arm. Pertinent negatives include no cough, exertional chest pressure or shortness of breath. The pain is aggravated by exertion. He has tried nothing for the symptoms. His past medical history is significant for hypertension. Prior diagnostic workup includes echocardiogram.        Outpatient Encounter Medications as of 02/07/2023  Medication Sig   amLODipine (NORVASC) 10 MG tablet Take 1 tablet (10 mg total) by mouth daily.   baclofen (LIORESAL) 10 MG tablet Take 1 tablet (10 mg total) by mouth at bedtime.   chlorthalidone (HYGROTON) 25 MG tablet Take 1 tablet (25 mg total) by mouth daily.   cyclobenzaprine (FLEXERIL) 10 MG tablet Take 1 tablet (10 mg total) by mouth 3 (three) times daily as needed for muscle spasms.   KRILL OIL PO Take by mouth.   LORazepam (ATIVAN) 1 MG tablet Take 1 tablet (1 mg total) by mouth daily.   losartan (COZAAR) 25 MG tablet Take 25 mg by mouth daily.   Magnesium 400 MG CAPS Take 1 capsule by mouth daily.   metoprolol tartrate (LOPRESSOR) 100 MG tablet Take 1 tablet (100 mg total) by mouth as directed. Two Hours Prior to CT Scan   nitroGLYCERIN (NITROSTAT) 0.4 MG SL tablet Place 1 tablet (0.4 mg  total) under the tongue every 5 (five) minutes as needed for chest pain.   OVER THE COUNTER MEDICATION Vitamin D3 with K2   OVER THE COUNTER MEDICATION Take 1 tablet by mouth daily. Vitamin b6,folic acid,vitamin d and vitamin b12   OVER THE COUNTER MEDICATION Take 1 tablet by mouth daily. Scription BP   pantoprazole (PROTONIX) 20 MG tablet Take 1 tablet (20 mg total) by mouth daily.   predniSONE (DELTASONE) 20 MG tablet Take 1 tablet (20 mg total) by mouth 2 (two) times daily with a meal for 5 days.   promethazine (PHENERGAN) 12.5 MG tablet Take 1 tablet (12.5 mg total) by mouth every 6 (six) hours as needed for nausea or vomiting.   sertraline (ZOLOFT) 100 MG tablet Take 100 mg by mouth daily.   [DISCONTINUED] methylPREDNISolone (MEDROL DOSEPAK) 4 MG TBPK tablet 6 day taper; take as directed on package instructions   No facility-administered encounter medications on file as of 02/07/2023.    Past Surgical History:  Procedure Laterality Date   LAPAROTOMY N/A 08/25/2019   Procedure: EXPLORATORY LAPAROTOMY, RENAL EXPLORATION CLOSURE OF RETROPERITONEUM;  Surgeon: Manus Rudd, MD;  Location: [facility] OR;  Service: General;  Laterality: N/A;    Review of Systems  Constitutional:  Negative for chills and fever.  Eyes:  Negative for blurred vision.  Respiratory:  Negative for shortness of breath.   Cardiovascular:  Positive for chest pain. Negative for leg swelling.  Neurological:  Negative for headaches.      Objective    BP 138/86 (BP Location: Left Arm)   Pulse 80   Ht 5\' 10"  (1.778 m)   Wt 212 lb (96.2 kg)   SpO2 98%   BMI 30.42 kg/m   Physical Exam Vitals reviewed.  Constitutional:      General: He is not in acute distress.    Appearance: Normal appearance. He is not ill-appearing, toxic-appearing or diaphoretic.  HENT:     Head: Normocephalic.  Eyes:     General:        Right eye: No discharge.        Left eye: No discharge.     Conjunctiva/sclera: Conjunctivae normal.   Cardiovascular:     Rate and Rhythm: Normal rate.     Pulses: Normal pulses.     Heart sounds: Normal heart sounds.  Pulmonary:     Effort: Pulmonary effort is normal. No respiratory distress.     Breath sounds: Normal breath sounds.  Abdominal:     General: Bowel sounds are normal.     Palpations: Abdomen is soft.     Tenderness: There is no abdominal tenderness. There is no right CVA tenderness, left CVA tenderness or guarding.  Musculoskeletal:        General: Normal range of motion.     Cervical back: Normal range of motion.  Skin:    General: Skin is warm and dry.     Capillary Refill: Capillary refill takes less than 2 seconds.  Neurological:     General: No focal deficit present.     Mental Status: He is alert and oriented to person, place, and time.     Coordination: Coordination normal.     Gait: Gait normal.  Psychiatric:        Mood and Affect: Mood normal.        Behavior: Behavior normal.        Thought Content: Thought content normal.        Judgment: Judgment normal.       Assessment & Plan:  Vitamin D deficiency -     VITAMIN D 25 Hydroxy (Vit-D Deficiency, Fractures) -     VITAMIN D 25 Hydroxy (Vit-D Deficiency, Fractures)  Primary hypertension -     BMP8+eGFR  Screening for lipid disorders -     Lipid panel  Prediabetes -     Hemoglobin A1c  Neck pain -     Ambulatory referral to Orthopedics  Costochondritis Assessment & Plan: EKG done yesterday in ED Symptoms consistent with Costochondritis Prednisone 20 mg twice daily x 5 days Advise Avoid Strain: Avoid activities that exacerbate the pain, such as heavy lifting, strenuous exercise, and certain movements that put stress on the chest area. Heat: Apply a heating pad or warm compress to the painful area for 15-20 minutes several times a day to relax muscles and reduce pain. If pain persists follow up with Cardiology OR er   Other orders -     predniSONE; Take 1 tablet (20 mg total) by mouth  2 (two) times daily with a meal for 5 days.  Dispense: 10 tablet; Refill: 0    No follow-ups on file.   Cruzita Lederer Newman Nip, FNP

## 2023-02-07 NOTE — Assessment & Plan Note (Addendum)
EKG done yesterday in ED Symptoms consistent with Costochondritis Prednisone 20 mg twice daily x 5 days Advise Avoid Strain: Avoid activities that exacerbate the pain, such as heavy lifting, strenuous exercise, and certain movements that put stress on the chest area. Heat: Apply a heating pad or warm compress to the painful area for 15-20 minutes several times a day to relax muscles and reduce pain. If pain persists follow up with Cardiology OR er

## 2023-02-08 LAB — VITAMIN D 25 HYDROXY (VIT D DEFICIENCY, FRACTURES): Vit D, 25-Hydroxy: 20.4 ng/mL — ABNORMAL LOW (ref 30.0–100.0)

## 2023-02-11 ENCOUNTER — Telehealth: Payer: Self-pay | Admitting: Family Medicine

## 2023-02-11 NOTE — Telephone Encounter (Signed)
[facility] calling wanting to make sure the patient's referral was supposed to go to [facility] before they call and get the patient scheduled. Please advise # [phone] Thank you

## 2023-02-11 NOTE — Telephone Encounter (Signed)
I do not remember

## 2023-02-12 ENCOUNTER — Ambulatory Visit (HOSPITAL_COMMUNITY): Payer: No Typology Code available for payment source | Attending: Family Medicine | Admitting: Physical Therapy

## 2023-02-12 ENCOUNTER — Inpatient Hospital Stay: Payer: PRIVATE HEALTH INSURANCE | Admitting: Family Medicine

## 2023-02-12 DIAGNOSIS — M542 Cervicalgia: Secondary | ICD-10-CM | POA: Insufficient documentation
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
## 2023-02-13 DIAGNOSIS — G4733 Obstructive sleep apnea (adult) (pediatric): Secondary | ICD-10-CM | POA: Diagnosis not present

## 2023-02-14 ENCOUNTER — Ambulatory Visit (HOSPITAL_COMMUNITY): Payer: No Typology Code available for payment source | Admitting: Physical Therapy

## 2023-02-14 DIAGNOSIS — M542 Cervicalgia: Secondary | ICD-10-CM | POA: Diagnosis not present
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
## 2023-02-18 ENCOUNTER — Ambulatory Visit (INDEPENDENT_AMBULATORY_CARE_PROVIDER_SITE_OTHER): Payer: No Typology Code available for payment source | Admitting: Orthopedic Surgery

## 2023-02-18 DIAGNOSIS — M542 Cervicalgia: Secondary | ICD-10-CM | POA: Diagnosis not present

## 2023-02-18 NOTE — Progress Notes (Unsigned)
Neck pain and lower back pain.  Neck pain is causing him to be dizzy. He is doing HEP and has limited ROM.  He says his ears ring and has pain shooting down his L arm.   Lower back is a constant pain he says its like he is walking on air he is unstable and feels as if he isn't grounded.   No injury that he can recall.

## 2023-02-19 ENCOUNTER — Encounter: Payer: Self-pay | Admitting: Orthopedic Surgery
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
## 2023-03-08 DIAGNOSIS — H5213 Myopia, bilateral: Secondary | ICD-10-CM | POA: Diagnosis not present

## 2023-03-11 ENCOUNTER — Ambulatory Visit (HOSPITAL_COMMUNITY): Payer: No Typology Code available for payment source | Attending: Family Medicine | Admitting: Physical Therapy

## 2023-03-11 DIAGNOSIS — M542 Cervicalgia: Secondary | ICD-10-CM | POA: Insufficient documentation
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
## 2023-03-13 DIAGNOSIS — R7303 Prediabetes: Secondary | ICD-10-CM | POA: Diagnosis not present

## 2023-03-13 DIAGNOSIS — R202 Paresthesia of skin: Secondary | ICD-10-CM | POA: Diagnosis not present

## 2023-03-13 DIAGNOSIS — I10 Essential (primary) hypertension: Secondary | ICD-10-CM | POA: Diagnosis not present

## 2023-03-13 DIAGNOSIS — F411 Generalized anxiety disorder: Secondary | ICD-10-CM | POA: Diagnosis not present

## 2023-03-26 ENCOUNTER — Other Ambulatory Visit (INDEPENDENT_AMBULATORY_CARE_PROVIDER_SITE_OTHER): Payer: No Typology Code available for payment source

## 2023-03-26 ENCOUNTER — Encounter: Payer: Self-pay | Admitting: Orthopedic Surgery

## 2023-03-26 ENCOUNTER — Ambulatory Visit (INDEPENDENT_AMBULATORY_CARE_PROVIDER_SITE_OTHER): Payer: No Typology Code available for payment source | Admitting: Orthopedic Surgery

## 2023-03-26 VITALS — BP 163/107 | Ht 70.0 in | Wt 212.0 lb

## 2023-03-26 DIAGNOSIS — R42 Dizziness and giddiness: Secondary | ICD-10-CM

## 2023-03-26 DIAGNOSIS — M5412 Radiculopathy, cervical region: Secondary | ICD-10-CM

## 2023-03-26 DIAGNOSIS — M542 Cervicalgia: Secondary | ICD-10-CM
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
## 2023-03-28 ENCOUNTER — Encounter: Payer: Self-pay | Admitting: Neurology

## 2023-03-28 ENCOUNTER — Other Ambulatory Visit (INDEPENDENT_AMBULATORY_CARE_PROVIDER_SITE_OTHER): Payer: No Typology Code available for payment source

## 2023-03-28 ENCOUNTER — Ambulatory Visit: Payer: No Typology Code available for payment source | Admitting: Neurology

## 2023-03-28 VITALS — BP 161/92 | HR 60 | Ht 71.0 in | Wt 215.6 lb

## 2023-03-28 DIAGNOSIS — F32A Depression, unspecified: Secondary | ICD-10-CM

## 2023-03-28 DIAGNOSIS — F419 Anxiety disorder, unspecified: Secondary | ICD-10-CM

## 2023-03-28 DIAGNOSIS — F4310 Post-traumatic stress disorder, unspecified: Secondary | ICD-10-CM

## 2023-03-28 DIAGNOSIS — G4486 Cervicogenic headache: Secondary | ICD-10-CM

## 2023-03-28 DIAGNOSIS — G43709 Chronic migraine without aura, not intractable, without status migrainosus: Secondary | ICD-10-CM

## 2023-03-28 LAB — VITAMIN B12: Vitamin B-12: 297 pg/mL (ref 211–911)

## 2023-03-28 LAB — FOLATE: Folate: 17.1 ng/mL (ref >5.9)

## 2023-03-28 MED ORDER — NORTRIPTYLINE HCL 10 MG PO CAPS
20.0000 mg | ORAL_CAPSULE | Freq: Every day | ORAL | 5 refills | Status: AC
Start: 2023-03-28 — End: ?

## 2023-03-28 NOTE — Patient Instructions (Signed)
I would like to investigate your symptoms further with blood work today. I will be in touch when I have your results.  I would like you to take Vitamin D 1000 international units (IU) daily as your vit D levels were low.  I am going to try a medication called Nortriptyline for your head pain to see if this helps. You will take 1 pill at bedtime for 1 week, then increase to 2 pills at bedtime. This medication can make you sleepy or with a dry mouth.  I am also referring you to psychiatry for your anxiety and likely PTSD.  I will see you back in clinic in 3 months. Please let me know if you have any questions or concerns in the meantime.   The physicians and staff at [facility] are committed to providing excellent care. You may receive a survey requesting feedback about your experience at our office. We strive to receive "very good" responses to the survey questions. If you feel that your experience would prevent you from giving the office a "very good " response, please contact our office to try to remedy the situation. We may be reached at [phone]. Thank you for taking the time out of your busy day to complete the survey.  Jacquelyne Balint, MD [facility]

## 2023-03-30 ENCOUNTER — Emergency Department (HOSPITAL_COMMUNITY)
Admission: EM | Admit: 2023-03-30 | Discharge: 2023-03-30 | Disposition: A | Discharge status: Home / Self Care | Payer: No Typology Code available for payment source | Attending: Emergency Medicine | Admitting: Emergency Medicine

## 2023-03-30 ENCOUNTER — Other Ambulatory Visit: Payer: Self-pay

## 2023-03-30 ENCOUNTER — Emergency Department (HOSPITAL_COMMUNITY): Payer: No Typology Code available for payment source

## 2023-03-30 ENCOUNTER — Encounter (HOSPITAL_COMMUNITY): Payer: Self-pay

## 2023-03-30 DIAGNOSIS — R0789 Other chest pain: Secondary | ICD-10-CM | POA: Insufficient documentation

## 2023-03-30 DIAGNOSIS — M4802 Spinal stenosis, cervical region: Secondary | ICD-10-CM | POA: Diagnosis not present

## 2023-03-30 DIAGNOSIS — R079 Chest pain, unspecified: Secondary | ICD-10-CM

## 2023-03-30 DIAGNOSIS — M4803 Spinal stenosis, cervicothoracic region: Secondary | ICD-10-CM | POA: Diagnosis not present

## 2023-03-30 DIAGNOSIS — R202 Paresthesia of skin: Secondary | ICD-10-CM | POA: Insufficient documentation

## 2023-03-30 DIAGNOSIS — R200 Anesthesia of skin: Secondary | ICD-10-CM | POA: Insufficient documentation

## 2023-03-30 DIAGNOSIS — R42 Dizziness and giddiness: Secondary | ICD-10-CM | POA: Diagnosis not present

## 2023-03-30 DIAGNOSIS — M5011 Cervical disc disorder with radiculopathy,  high cervical region: Secondary | ICD-10-CM | POA: Diagnosis not present

## 2023-03-30 DIAGNOSIS — R251 Tremor, unspecified: Secondary | ICD-10-CM | POA: Diagnosis not present

## 2023-03-30 DIAGNOSIS — G459 Transient cerebral ischemic attack, unspecified: Secondary | ICD-10-CM | POA: Diagnosis not present

## 2023-03-30 LAB — CBC WITH DIFFERENTIAL/PLATELET
Abs Immature Granulocytes: 0.01 10*3/uL (ref 0.00–0.07)
Basophils Absolute: 0 10*3/uL (ref 0.0–0.1)
Basophils Relative: 1 %
Eosinophils Absolute: 0.1 10*3/uL (ref 0.0–0.5)
Eosinophils Relative: 2 %
HCT: 46.6 % (ref 39.0–52.0)
Hemoglobin: 15.7 g/dL (ref 13.0–17.0)
Immature Granulocytes: 0 %
Lymphocytes Relative: 39 %
Lymphs Abs: 2.2 10*3/uL (ref 0.7–4.0)
MCH: 32 pg (ref 26.0–34.0)
MCHC: 33.7 g/dL (ref 30.0–36.0)
MCV: 94.9 fL (ref 80.0–100.0)
Monocytes Absolute: 0.6 10*3/uL (ref 0.1–1.0)
Monocytes Relative: 10 %
Neutro Abs: 2.7 10*3/uL (ref 1.7–7.7)
Neutrophils Relative %: 48 %
Platelets: 168 10*3/uL (ref 150–400)
RBC: 4.91 MIL/uL (ref 4.22–5.81)
RDW: 12.3 % (ref 11.5–15.5)
WBC: 5.7 10*3/uL (ref 4.0–10.5)
nRBC: 0 % (ref 0.0–0.2)

## 2023-03-30 LAB — COMPREHENSIVE METABOLIC PANEL
ALT: 20 U/L (ref 0–44)
AST: 19 U/L (ref 15–41)
Albumin: 4.9 g/dL (ref 3.5–5.0)
Alkaline Phosphatase: 64 U/L (ref 38–126)
Anion gap: 10 (ref 5–15)
BUN: 9 mg/dL (ref 6–20)
CO2: 24 mmol/L (ref 22–32)
Calcium: 9.3 mg/dL (ref 8.9–10.3)
Chloride: 103 mmol/L (ref 98–111)
Creatinine, Ser: 1 mg/dL (ref 0.61–1.24)
GFR, Estimated: >60 mL/min (ref >60)
Glucose, Bld: 94 mg/dL (ref 70–99)
Potassium: 3.5 mmol/L (ref 3.5–5.1)
Sodium: 137 mmol/L (ref 135–145)
Total Bilirubin: 0.7 mg/dL (ref 0.3–1.2)
Total Protein: 7.5 g/dL (ref 6.5–8.1)

## 2023-03-30 LAB — TROPONIN I (HIGH SENSITIVITY): Troponin I (High Sensitivity): 5 ng/L (ref <18)

## 2023-03-30 MED ORDER — SODIUM CHLORIDE 0.9 % IV BOLUS: 1000.0000 mL | Freq: Once | INTRAVENOUS | Status: DC

## 2023-03-30 MED ORDER — KETOROLAC TROMETHAMINE 15 MG/ML IJ SOLN: 15.0000 mg | Freq: Once | INTRAMUSCULAR | Status: DC

## 2023-03-30 MED ORDER — METOCLOPRAMIDE HCL 5 MG/ML IJ SOLN: 10.0000 mg | Freq: Once | INTRAMUSCULAR | Status: DC

## 2023-03-30 MED ORDER — LORAZEPAM 1 MG PO TABS: 1.0000 mg | ORAL_TABLET | Freq: Once | ORAL | Status: DC

## 2023-03-30 MED ORDER — DIPHENHYDRAMINE HCL 50 MG/ML IJ SOLN: 25.0000 mg | Freq: Once | INTRAMUSCULAR | Status: DC

## 2023-03-30 NOTE — ED Provider Notes (Signed)
[place] [facility]ERGENCY DEPARTMENT AT [facility] Provider Note   CSN: [id number] Arrival date & time: 03/30/23  1806     History  Chief Complaint  Patient presents with   Chest Pain    Gerald Phillips is a 42 y.o. male,  hx of headaches, chest pain, complains of pressurized chest pain, on the left side of his chest, going to his left neck, this been going on for the last 3 days with radiation to his left side of his head.   Also, he states that he feels like his left eye is weak, as well as his right lower extremity is weak.  This has been going on for 2 to 3 days as well, but he feels like is getting worse.  Denies any facial droop, difficulties with speech, dizziness, vision changes.  No falls, or recent trauma.  Home Medications Prior to Admission medications   Medication Sig Start Date End Date Taking? Authorizing Provider  amLODipine (NORVASC) 10 MG tablet Take 1 tablet (10 mg total) by mouth daily. 08/26/22   Del Nigel Berthold, FNP  baclofen (LIORESAL) 10 MG tablet Take 1 tablet (10 mg total) by mouth at bedtime. 12/04/22   Gilmore Laroche, FNP  cyclobenzaprine (FLEXERIL) 10 MG tablet Take 1 tablet (10 mg total) by mouth 3 (three) times daily as needed for muscle spasms. 08/21/22   Delorse Lek, FNP  KRILL OIL PO Take by mouth.    [provider]  LORazepam (ATIVAN) 1 MG tablet Take 1 tablet (1 mg total) by mouth daily. Patient not taking: Reported on 03/28/2023 09/03/22   Achille Rich, PA-C  Magnesium 400 MG CAPS Take 1 capsule by mouth daily.    [provider]  nitroGLYCERIN (NITROSTAT) 0.4 MG SL tablet Place 1 tablet (0.4 mg total) under the tongue every 5 (five) minutes as needed for chest pain. Patient not taking: Reported on 03/28/2023 10/16/22   Del Nigel Berthold, FNP  nortriptyline (PAMELOR) 10 MG capsule Take 2 capsules (20 mg total) by mouth at bedtime. Take 1 capsule (10 mg) at bedtime for 1 week, then take 2 capsules (20 mg)  thereafter 03/28/23   Antony Madura, MD  OVER THE COUNTER MEDICATION Vitamin D3 with K2    [provider]  OVER THE COUNTER MEDICATION Take 1 tablet by mouth daily. Vitamin b6,folic acid,vitamin d and vitamin b12 Patient not taking: Reported on 03/28/2023    [provider]  OVER THE COUNTER MEDICATION Take 1 tablet by mouth daily. Scription BP    [provider]      Allergies    Atarax [hydroxyzine], Penicillins, and Penicillins    Review of Systems   Review of Systems  Cardiovascular:  Positive for chest pain. Negative for leg swelling.  Neurological:  Positive for numbness.    Physical Exam Updated Vital Signs BP (!) 163/103 (BP Location: Right Arm)   Pulse 67   Temp 99.1 F (37.3 C) (Oral)   Resp 17   Ht 5\' 11"  (1.803 m)   Wt 97.5 kg   SpO2 100%   BMI 29.99 kg/m  Physical Exam Vitals and nursing note reviewed.  Constitutional:      General: He is not in acute distress.    Appearance: He is well-developed.  HENT:     Head: Normocephalic and atraumatic.  Eyes:     Conjunctiva/sclera: Conjunctivae normal.  Cardiovascular:     Rate and Rhythm: Normal rate and regular rhythm.  Heart sounds: No murmur heard. Pulmonary:     Effort: Pulmonary effort is normal. No respiratory distress.     Breath sounds: Normal breath sounds.  Abdominal:     Palpations: Abdomen is soft.     Tenderness: There is no abdominal tenderness.  Musculoskeletal:        General: No swelling.     Cervical back: Neck supple.     Comments: Tenderness to palpation of bilateral perispinal muscles of the cervical spine, as well as left chest wall tenderness.   Skin:    General: Skin is warm and dry.     Capillary Refill: Capillary refill takes less than 2 seconds.  Neurological:     General: No focal deficit present.     Mental Status: He is alert.     Comments: No facial droop, equal strength of bilateral upper and lower extremities.  No neurodeficits noted,  sensation intact.  No sensory deficits.  Good sensation to face, neck.   No ptosis.  Psychiatric:        Mood and Affect: Mood normal.     ED Results / Procedures / Treatments   Labs (all labs ordered are listed, but only abnormal results are displayed) Labs Reviewed  CBC WITH DIFFERENTIAL/PLATELET  COMPREHENSIVE METABOLIC PANEL  TROPONIN I (HIGH SENSITIVITY)  TROPONIN I (HIGH SENSITIVITY)    EKG EKG Interpretation Date/Time:  Sunday March 30 2023 18:21:48 EDT Ventricular Rate:  74 PR Interval:  160 QRS Duration:  76 QT Interval:  368 QTC Calculation: 408 R Axis:   21  Text Interpretation: Normal sinus rhythm with sinus arrhythmia Normal ECG When compared with ECG of 05-Feb-2023 21:16, T wave amplitude has increased in Anterior leads Confirmed by Eber Hong ([id number]) on 03/30/2023 6:29:31 PM  Radiology MR Cervical Spine Wo Contrast  Result Date: 03/30/2023 CLINICAL DATA:  Cervical radiculopathy, no red flags EXAM: MRI CERVICAL SPINE WITHOUT CONTRAST TECHNIQUE: Multiplanar, multisequence MR imaging of the cervical spine was performed. No intravenous contrast was administered. COMPARISON:  No prior MRI available, correlation is made with cervical spine radiographs 03/26/2023 and 12/12/2022 FINDINGS: Alignment: Reversal of the normal cervical lordosis. Trace retrolisthesis of C6 on C7. There is a levocurvature to the cervical spine. Vertebrae: Increased T2 signal throughout the C6 vertebral at the left greater than right aspect of C6 and C7 (series 7, image 9). This is also seen, to a lesser extent and eccentric to the right, at C3-C4 (series 7, image 7). No abnormal signal in the disc. This is favored to be degenerative, with the asymmetry of the changes is likely related to the aforementioned levocurvature. No evidence of acute fracture or suspicious osseous lesion. Congenitally short pedicles, which narrow the AP diameter of the spinal canal. Cord: Normal signal and morphology. No  evidence of cord compression. Posterior Fossa, vertebral arteries, paraspinal tissues: Loss of part of the flow void in the left internal jugular vein is favored to be related to slow flow. Otherwise negative. Disc levels: C2-C3: Minimal disc bulge. No spinal canal stenosis or neural foraminal narrowing. C3-C4: Mild disc bulge. Facet and uncovertebral hypertrophy. Moderate to severe spinal canal stenosis. Mild-to-moderate left and moderate to severe right neural foraminal narrowing. C4-C5: Minimal disc bulge. No spinal canal stenosis or neural foraminal narrowing. C5-C6: Mild disc bulge. Facet and uncovertebral hypertrophy. Moderate spinal canal stenosis. Mild right and moderate left neural foraminal narrowing. C6-C7: Trace retrolisthesis with moderate disc bulge. Facet and uncovertebral hypertrophy. Moderate to severe spinal canal stenosis. Severe bilateral neural  foraminal narrowing. C7-T1: Mild disc bulge with right foraminal protrusion. Facet and uncovertebral hypertrophy. No spinal canal stenosis. Moderate to severe right neural foraminal narrowing. IMPRESSION: 1. C6-C7 moderate to severe spinal canal stenosis with severe bilateral neural foraminal narrowing. 2. C3-C4 moderate to severe spinal canal stenosis with moderate to severe right and mild-to-moderate left neural foraminal narrowing. 3. C5-C6 moderate spinal canal stenosis with mild right and moderate left neural foraminal narrowing. 4. C7-T1 moderate to severe right neural foraminal narrowing. Electronically Signed   By: Wiliam Ke M.D.   On: 03/30/2023 19:53   DG Chest 2 View  Result Date: 03/30/2023 CLINICAL DATA:  chest pain EXAM: CHEST - 2 VIEW COMPARISON:  Chest x-ray 10/15/2022, CT cardiac 12/18/2022 FINDINGS: The heart and mediastinal contours are within normal limits. No focal consolidation. No pulmonary edema. No pleural effusion. No pneumothorax. No acute osseous abnormality. IMPRESSION: No active cardiopulmonary disease. Electronically  Signed   By: Tish Frederickson M.D.   On: 03/30/2023 19:34   CT Head Wo Contrast  Result Date: 03/30/2023 CLINICAL DATA:  Transient ischemic attack (TIA) C/o base of neck pain radiating into left neck and centralized chest and feeling anxious, lightheaded and tremors. Seen on 9/20 at neurologist for neck pain. EXAM: CT HEAD WITHOUT CONTRAST TECHNIQUE: Contiguous axial images were obtained from the base of the skull through the vertex without intravenous contrast. RADIATION DOSE REDUCTION: This exam was performed according to the departmental dose-optimization program which includes automated exposure control, adjustment of the mA and/or kV according to patient size and/or use of iterative reconstruction technique. COMPARISON:  None Available. FINDINGS: Brain: No evidence of large-territorial acute infarction. No parenchymal hemorrhage. No mass lesion. No extra-axial collection. No mass effect or midline shift. No hydrocephalus. Basilar cisterns are patent. Vascular: No hyperdense vessel. Skull: No acute fracture or focal lesion. Sinuses/Orbits: Paranasal sinuses and mastoid air cells are clear. The orbits are unremarkable. Other: None. IMPRESSION: No acute intracranial abnormality. Electronically Signed   By: Tish Frederickson M.D.   On: 03/30/2023 19:34    Procedures Procedures    Medications Ordered in ED Medications - No data to display   ED Course/ Medical Decision Making/ A&P                                 Medical Decision Making Patient is a 42 year old male, here for variety of complaints including Pretor[place] chest pain, this been going on for the last 2 to 3 days, and radiates to his left side of his head, as well as a headache, and weakness of his right lower extremity, and the left eye.  He has no ptosis on my exam, has good grip strength, bilateral upper and lower extremity strength.  5 out of 5.  No neurodeficits noted, no sensory deficits.  He does have good sensation to his facial  nerves.  We will obtain a head CT, given his neurosymptoms, and it has been going on for 2 to 3 days.  Additionally we will also obtain an MRI of his neck, as he was set to have this outpatient, but has not gotten it, and I believe that his symptoms are likely originating, from his neck.  Amount and/or Complexity of Data Reviewed Labs: ordered.    Details: Troponin 5, no acute abnormalities Radiology: ordered.    Details: CT head shows no acute abnormalities.  MRI shows diffuse cervical and T1 neuroforaminal narrowing ECG/medicine tests: ordered. Decision-making  details documented in ED Course. Discussion of management or test interpretation with external provider(s): Discussed with patient, labs are all reassuring, troponin flat, CT head reassuring no acute abnormality noted.  His symptoms have been going on for 2 to 3 days, thus he is out of the window.  He does not have any deficits on my exam.  I believe this is all secondary to his cervical spine stenosis.  His MRI of his neck, shows diffuse spinal stenosis, neuroforaminal narrowing, believe that he needs to follow-up with a neurosurgeon, I provided him a referral, and encouraged him to return if symptoms worsen.  He has good strength on my exam, no focal deficits.  Troponin is flat.  Return precautions emphasized discharged home  Risk Prescription drug management.    Final Clinical Impression(s) / ED Diagnoses Final diagnoses:  Chest pain, unspecified type  Spinal stenosis of cervicothoracic region  Numbness and tingling    Rx / DC Orders ED Discharge Orders     None         Gioia Ranes, [place]ley [place], PA 03/30/23 2033    Eber Hong, MD 03/31/23 [phone]

## 2023-03-30 NOTE — ED Notes (Addendum)
Pt has had all scans, went in to give medication and he said he would rather not take them. A&O x4 complaining of a headache, no other complaints.

## 2023-03-30 NOTE — ED Provider Triage Note (Signed)
Emergency Medicine Provider Triage Evaluation Note  DIONYSIOS PFITZER , a 42 y.o. male  was evaluated in triage.  Pt complains of pressurized chest pain, on the left side of his chest, going to his left neck, this been going on for the last 3 days.  He states that he feels like his left he is weak, as well as his right lower extremity is weak.  This has been going on for 2 to 3 days as well, but he feels like is getting worse.  Denies any facial droop, difficulties with speech, dizziness, vision changes.   Review of Systems  Positive: Weakness Negative: Shortness of breath  Physical Exam  BP (!) 163/103 (BP Location: Right Arm)   Pulse 67   Temp 99.1 F (37.3 C) (Oral)   Resp 17   Wt 97 kg   SpO2 100%   BMI 29.83 kg/m  Gen:   Awake, no distress   Resp:  Normal effort  MSK:   Moves extremities without difficulty  Other:  Bilateral upper and lower extremity strength 5 out of 5, no facial droop, symmetric smile, good strength, no neurodeficits noted.  Good sensation of bilateral lower and upper extremities.  Medical Decision Making  Medically screening exam initiated at 6:26 PM.  Appropriate orders placed.  ANTON BICKNESE was informed that the remainder of the evaluation will be completed by another provider, this initial triage assessment does not replace that evaluation, and the importance of remaining in the ED until their evaluation is complete.     Pete Pelt, [place] 03/30/23 1842

## 2023-03-30 NOTE — Discharge Instructions (Addendum)
Please follow-up with the neurosurgery as I instructed.  Make sure you take your medications as instructed at home.  Return to the ER if you have any weakness, difficulty with speech, numbness, or visual changes.

## 2023-03-30 NOTE — ED Triage Notes (Signed)
C/o base of neck pain radiating into left neck and centralized chest and feeling anxious, lightheaded and tremors.  Seen on 9/20 at neurologist for neck pain.  Denies sob.  Ambulatory to triage

## 2023-04-01 LAB — VITAMIN B1: Vitamin B1 (Thiamine): 8 nmol/L (ref 8–30)

## 2023-04-10 DIAGNOSIS — R7303 Prediabetes: Secondary | ICD-10-CM | POA: Diagnosis not present

## 2023-04-10 DIAGNOSIS — M4712 Other spondylosis with myelopathy, cervical region: Secondary | ICD-10-CM | POA: Diagnosis not present

## 2023-04-10 DIAGNOSIS — F411 Generalized anxiety disorder: Secondary | ICD-10-CM | POA: Diagnosis not present

## 2023-04-10 DIAGNOSIS — I10 Essential (primary) hypertension: Secondary | ICD-10-CM | POA: Diagnosis not present

## 2023-04-17 ENCOUNTER — Ambulatory Visit (INDEPENDENT_AMBULATORY_CARE_PROVIDER_SITE_OTHER): Payer: No Typology Code available for payment source | Admitting: Orthopedic Surgery

## 2023-04-17 VITALS — BP 124/89

## 2023-04-17 DIAGNOSIS — M5412 Radiculopathy, cervical region: Secondary | ICD-10-CM | POA: Diagnosis not present

## 2023-04-17 NOTE — Progress Notes (Signed)
Orthopedic Spine Surgery Office Note   Assessment: Patient is a 42 y.o. male with neck pain that radiates into the right upper extremity, suspect radiculopathy. Has foraminal stenosis at C6/7     Plan: -Talked about trying traction to help with his symptoms -Recommended diagnostic/therapeutic cervical ESI -Explained that the radiating arm pain is more consistent with his MRI. Headaches sometimes are associated with neck pathology but told him the majority are not -Would need to be nicotine free prior to any elective spine surgery -Patient should return to office on an as needed basis     Patient expressed understanding of the plan and all questions were answered to the patient's satisfaction.    ___________________________________________________________________________     History:   Patient is a 42 y.o. male who presents today for follow-up on his cervical spine.  He is still having neck pain that he feels radiates up to the base of his skull.  He also has frequent headaches.  He says they wrap around his head.  He feels that the headaches correlate to when he is having worse neck pain.  He feels that the headaches are more constant than when he was last seen in the office.  He also has pain that radiates into the right upper extremity.  He feels a goes into the right shoulder and lateral aspect the arm.  He feels that the radiating arm pain is better since he was last seen in the office.  The headaches and neck pain have remained significant and limit his ability to do activities during the day.  He also reports dizziness and ringing in his ears that is similar to when he was last seen.  No new symptoms since he was last seen.  No difficulty with fine motor skills in the hand.  No unsteadiness with gait.  Rates the pain as a 9 out of 10.    Treatments tried: PT, Tylenol, ibuprofen     Physical Exam:  General: no acute distress, appears stated age Neurologic: alert, answering questions  appropriately, following commands Respiratory: unlabored breathing on room air, symmetric chest rise Psychiatric: appropriate affect, normal cadence to speech     MSK (spine):   -Strength exam                                                   Left                  Right Grip strength                5/5                  5/5 Interosseus                  5/5                  5/5 Wrist extension            5/5                  5/5 Wrist flexion                 5/5                  5/5 Elbow flexion  5/5                  5/5 Deltoid                          5/5                  5/5   -Sensory exam                           Sensation intact to light touch in C5-T1 nerve distributions of bilateral upper extremities   -Brachioradialis DTR: 2/4 on the left, 2/4 on the right -Biceps DTR: 2/4 on the left, 2/4 on the right   -Spurling: negative bilaterally -Hoffman sign: negative bilaterally -Clonus: no beats bilaterally -Interosseous wasting: none seen -Grip and release test: negative -Gait: normal     Imaging: XRs of the cervical spine from 03/26/2023 was previously independently reviewed and interpreted, showing disc height loss and anterior osteophyte formation at C5/6 and C6/7. No fracture or dislocation seen. No evidence of instability on flexion/extension views.   MRI of the cervical spine from 03/30/2023 was independently reviewed and interpreted, showing right-sided foraminal stenosis at C3/4.  There is left-sided foraminal stenosis at C5/6.  There is bilateral foraminal stenosis at C6/7.  Modic changes are seen along the C6/7 endplates.  No significant central stenosis.  Small focus of T2 cord signal change seen on the right at C3/4.  Central stenosis seen at C3/4 and C6/7. CSF is seen around the cord on the sagittal views.      Patient name: Gerald Phillips Patient MRN: [id number] Date of visit: 04/17/23

## 2023-04-29 ENCOUNTER — Ambulatory Visit: Payer: No Typology Code available for payment source | Admitting: Physical Medicine and Rehabilitation

## 2023-04-29 ENCOUNTER — Other Ambulatory Visit: Payer: Self-pay

## 2023-04-29 DIAGNOSIS — M5412 Radiculopathy, cervical region: Secondary | ICD-10-CM

## 2023-04-29 MED ORDER — METHYLPREDNISOLONE ACETATE 40 MG/ML IJ SUSP
40.0000 mg | Freq: Once | INTRAMUSCULAR | Status: AC
Start: 2023-04-29 — End: 2023-04-29
  Administered 2023-04-29: 40 mg

## 2023-04-29 NOTE — Progress Notes (Signed)
Functional Pain Scale - descriptive words and definitions  Distracting (5)    Aware of pain/able to complete some ADL's but limited by pain/sleep is affected and active distractions are only slightly useful. Moderate range order  Average Pain 8  163/116  +Driver, -BT, -Dye Allergies.

## 2023-04-29 NOTE — Patient Instructions (Signed)

## 2023-05-07 NOTE — Procedures (Signed)
Cervical Epidural Steroid Injection - Interlaminar Approach with Fluoroscopic Guidance  Patient: Gerald Phillips      Date of Birth: 25-May-1981 MRN: [id number] PCP: Rica Records, FNP      Visit Date: 04/29/2023   Universal Protocol:    Date/Time: 10/30/247:16 AM  Consent Given By: the patient  Position: PRONE  Additional Comments: Vital signs were monitored before and after the procedure. Patient was prepped and draped in the usual sterile fashion. The correct patient, procedure, and site was verified.   Injection Procedure Details:   Procedure diagnoses: Cervical radiculopathy [M54.12]    Meds Administered:  Meds ordered this encounter  Medications   methylPREDNISolone acetate (DEPO-MEDROL) injection 40 mg     Laterality: Right  Location/Site: C7-T1  Needle: 3.5 in., 20 ga. Tuohy  Needle Placement: Paramedian epidural space  Findings:  -Comments: Excellent flow of contrast into the epidural space.  Procedure Details: Using a paramedian approach from the side mentioned above, the region overlying the inferior lamina was localized under fluoroscopic visualization and the soft tissues overlying this structure were infiltrated with 4 ml. of 1% Lidocaine without Epinephrine. A # 20 gauge, Tuohy needle was inserted into the epidural space using a paramedian approach.  The epidural space was localized using loss of resistance along with contralateral oblique bi-planar fluoroscopic views.  After negative aspirate for air, blood, and CSF, a 2 ml. volume of Isovue-250 was injected into the epidural space and the flow of contrast was observed. Radiographs were obtained for documentation purposes.   The injectate was administered into the level noted above.  Additional Comments:  No complications occurred Dressing: 2 x 2 sterile gauze and Band-Aid    Post-procedure details: Patient was observed during the procedure. Post-procedure instructions were  reviewed.  Patient left the clinic in stable condition.

## 2023-05-07 NOTE — Progress Notes (Signed)
Gerald Phillips - 42 y.o. male MRN [id number]  Date of birth: [birth year]  Office Visit Note: Visit Date: 04/29/2023 PCP: Rica Records, FNP Referred by: Gerald Sheer, MD  Subjective: Chief Complaint  Patient presents with   Neck - Pain   HPI:  Gerald Phillips is a 42 y.o. male who comes in today at the request of Dr. Willia Craze for planned Right C7-T1 Cervical Interlaminar epidural steroid injection with fluoroscopic guidance.  The patient has failed conservative care including home exercise, medications, time and activity modification.  This injection will be diagnostic and hopefully therapeutic.  Please see requesting physician notes for further details and justification.   ROS Otherwise per HPI.  Assessment & Plan: Visit Diagnoses:    ICD-10-CM   1. Cervical radiculopathy  M54.12 XR C-ARM NO REPORT    Epidural Steroid injection    methylPREDNISolone acetate (DEPO-MEDROL) injection 40 mg      Plan: No additional findings.   Meds & Orders:  Meds ordered this encounter  Medications   methylPREDNISolone acetate (DEPO-MEDROL) injection 40 mg    Orders Placed This Encounter  Procedures   XR C-ARM NO REPORT   Epidural Steroid injection    Follow-up: Return for visit to requesting provider as needed.   Procedures: No procedures performed  Cervical Epidural Steroid Injection - Interlaminar Approach with Fluoroscopic Guidance  Patient: Gerald Phillips      Date of Birth: Jun 21, 1981 MRN: [id number] PCP: Rica Records, FNP      Visit Date: 04/29/2023   Universal Protocol:    Date/Time: 10/30/247:16 AM  Consent Given By: the patient  Position: PRONE  Additional Comments: Vital signs were monitored before and after the procedure. Patient was prepped and draped in the usual sterile fashion. The correct patient, procedure, and site was verified.   Injection Procedure Details:   Procedure diagnoses: Cervical radiculopathy [M54.12]    Meds  Administered:  Meds ordered this encounter  Medications   methylPREDNISolone acetate (DEPO-MEDROL) injection 40 mg     Laterality: Right  Location/Site: C7-T1  Needle: 3.5 in., 20 ga. Tuohy  Needle Placement: Paramedian epidural space  Findings:  -Comments: Excellent flow of contrast into the epidural space.  Procedure Details: Using a paramedian approach from the side mentioned above, the region overlying the inferior lamina was localized under fluoroscopic visualization and the soft tissues overlying this structure were infiltrated with 4 ml. of 1% Lidocaine without Epinephrine. A # 20 gauge, Tuohy needle was inserted into the epidural space using a paramedian approach.  The epidural space was localized using loss of resistance along with contralateral oblique bi-planar fluoroscopic views.  After negative aspirate for air, blood, and CSF, a 2 ml. volume of Isovue-250 was injected into the epidural space and the flow of contrast was observed. Radiographs were obtained for documentation purposes.   The injectate was administered into the level noted above.  Additional Comments:  No complications occurred Dressing: 2 x 2 sterile gauze and Band-Aid    Post-procedure details: Patient was observed during the procedure. Post-procedure instructions were reviewed.  Patient left the clinic in stable condition.   Clinical History: MRI CERVICAL SPINE WITHOUT CONTRAST   TECHNIQUE: Multiplanar, multisequence MR imaging of the cervical spine was performed. No intravenous contrast was administered.   COMPARISON:  No prior MRI available, correlation is made with cervical spine radiographs 03/26/2023 and 12/12/2022   FINDINGS: Alignment: Reversal of the normal cervical lordosis. Trace retrolisthesis of C6 on C7.  There is a levocurvature to the cervical spine.   Vertebrae: Increased T2 signal throughout the C6 vertebral at the left greater than right aspect of C6 and C7 (series 7,  image 9). This is also seen, to a lesser extent and eccentric to the right, at C3-C4 (series 7, image 7). No abnormal signal in the disc. This is favored to be degenerative, with the asymmetry of the changes is likely related to the aforementioned levocurvature. No evidence of acute fracture or suspicious osseous lesion. Congenitally short pedicles, which narrow the AP diameter of the spinal canal.   Cord: Normal signal and morphology. No evidence of cord compression.   Posterior Fossa, vertebral arteries, paraspinal tissues: Loss of part of the flow void in the left internal jugular vein is favored to be related to slow flow. Otherwise negative.   Disc levels:   C2-C3: Minimal disc bulge. No spinal canal stenosis or neural foraminal narrowing.   C3-C4: Mild disc bulge. Facet and uncovertebral hypertrophy. Moderate to severe spinal canal stenosis. Mild-to-moderate left and moderate to severe right neural foraminal narrowing.   C4-C5: Minimal disc bulge. No spinal canal stenosis or neural foraminal narrowing.   C5-C6: Mild disc bulge. Facet and uncovertebral hypertrophy. Moderate spinal canal stenosis. Mild right and moderate left neural foraminal narrowing.   C6-C7: Trace retrolisthesis with moderate disc bulge. Facet and uncovertebral hypertrophy. Moderate to severe spinal canal stenosis. Severe bilateral neural foraminal narrowing.   C7-T1: Mild disc bulge with right foraminal protrusion. Facet and uncovertebral hypertrophy. No spinal canal stenosis. Moderate to severe right neural foraminal narrowing.   IMPRESSION: 1. C6-C7 moderate to severe spinal canal stenosis with severe bilateral neural foraminal narrowing. 2. C3-C4 moderate to severe spinal canal stenosis with moderate to severe right and mild-to-moderate left neural foraminal narrowing. 3. C5-C6 moderate spinal canal stenosis with mild right and moderate left neural foraminal narrowing. 4. C7-T1 moderate to  severe right neural foraminal narrowing.     Electronically Signed   By: Wiliam Ke M.D.   On: 03/30/2023 19:53     Objective:  VS:  HT:    WT:   BMI:     BP:   HR: bpm  TEMP: ( )  RESP:  Physical Exam Vitals and nursing note reviewed.  Constitutional:      General: He is not in acute distress.    Appearance: Normal appearance. He is not ill-appearing.  HENT:     Head: Normocephalic and atraumatic.     Right Ear: External ear normal.     Left Ear: External ear normal.  Eyes:     Extraocular Movements: Extraocular movements intact.  Cardiovascular:     Rate and Rhythm: Normal rate.     Pulses: Normal pulses.  Abdominal:     General: There is no distension.     Palpations: Abdomen is soft.  Musculoskeletal:        General: No signs of injury.     Cervical back: Neck supple. Tenderness present. No rigidity.     Right lower leg: No edema.     Left lower leg: No edema.     Comments: Patient has good strength in the upper extremities with 5 out of 5 strength in wrist extension long finger flexion APB.  No intrinsic hand muscle atrophy.  Negative Hoffmann's test.  Lymphadenopathy:     Cervical: No cervical adenopathy.  Skin:    Findings: No erythema or rash.  Neurological:     General: No focal deficit present.  Mental Status: He is alert and oriented to person, place, and time.     Sensory: No sensory deficit.     Motor: No weakness or abnormal muscle tone.     Coordination: Coordination normal.  Psychiatric:        Mood and Affect: Mood normal.        Behavior: Behavior normal.      Imaging: No results found.

## 2023-05-08 DIAGNOSIS — M6240 Contracture of muscle, unspecified site: Secondary | ICD-10-CM | POA: Diagnosis not present

## 2023-05-08 DIAGNOSIS — F411 Generalized anxiety disorder: Secondary | ICD-10-CM | POA: Diagnosis not present

## 2023-05-11 ENCOUNTER — Other Ambulatory Visit: Payer: Self-pay

## 2023-05-11 ENCOUNTER — Emergency Department (HOSPITAL_COMMUNITY)
Admission: EM | Admit: 2023-05-11 | Discharge: 2023-05-11 | Disposition: A | Discharge status: Home / Self Care | Payer: No Typology Code available for payment source | Attending: Emergency Medicine | Admitting: Emergency Medicine

## 2023-05-11 ENCOUNTER — Encounter (HOSPITAL_COMMUNITY): Payer: Self-pay | Admitting: Emergency Medicine

## 2023-05-11 DIAGNOSIS — F419 Anxiety disorder, unspecified: Secondary | ICD-10-CM | POA: Diagnosis not present

## 2023-05-11 DIAGNOSIS — I10 Essential (primary) hypertension: Secondary | ICD-10-CM | POA: Insufficient documentation

## 2023-05-11 DIAGNOSIS — Z79899 Other long term (current) drug therapy: Secondary | ICD-10-CM | POA: Insufficient documentation

## 2023-05-11 DIAGNOSIS — R519 Headache, unspecified: Secondary | ICD-10-CM | POA: Diagnosis not present

## 2023-05-11 DIAGNOSIS — M542 Cervicalgia: Secondary | ICD-10-CM | POA: Insufficient documentation

## 2023-05-11 DIAGNOSIS — R0789 Other chest pain: Secondary | ICD-10-CM | POA: Diagnosis not present

## 2023-05-11 DIAGNOSIS — R079 Chest pain, unspecified: Secondary | ICD-10-CM

## 2023-05-11 LAB — CBC WITH DIFFERENTIAL/PLATELET
Abs Immature Granulocytes: 0.01 10*3/uL (ref 0.00–0.07)
Basophils Absolute: 0 10*3/uL (ref 0.0–0.1)
Basophils Relative: 1 %
Eosinophils Absolute: 0.1 10*3/uL (ref 0.0–0.5)
Eosinophils Relative: 2 %
HCT: 43.2 % (ref 39.0–52.0)
Hemoglobin: 15.1 g/dL (ref 13.0–17.0)
Immature Granulocytes: 0 %
Lymphocytes Relative: 29 %
Lymphs Abs: 1.9 10*3/uL (ref 0.7–4.0)
MCH: 33.3 pg (ref 26.0–34.0)
MCHC: 35 g/dL (ref 30.0–36.0)
MCV: 95.4 fL (ref 80.0–100.0)
Monocytes Absolute: 0.7 10*3/uL (ref 0.1–1.0)
Monocytes Relative: 11 %
Neutro Abs: 3.8 10*3/uL (ref 1.7–7.7)
Neutrophils Relative %: 57 %
Platelets: 148 10*3/uL — ABNORMAL LOW (ref 150–400)
RBC: 4.53 MIL/uL (ref 4.22–5.81)
RDW: 13.2 % (ref 11.5–15.5)
WBC: 6.5 10*3/uL (ref 4.0–10.5)
nRBC: 0 % (ref 0.0–0.2)

## 2023-05-11 LAB — BASIC METABOLIC PANEL
Anion gap: 10 (ref 5–15)
BUN: 17 mg/dL (ref 6–20)
CO2: 23 mmol/L (ref 22–32)
Calcium: 9.3 mg/dL (ref 8.9–10.3)
Chloride: 104 mmol/L (ref 98–111)
Creatinine, Ser: 1.02 mg/dL (ref 0.61–1.24)
GFR, Estimated: >60 mL/min (ref >60)
Glucose, Bld: 133 mg/dL — ABNORMAL HIGH (ref 70–99)
Potassium: 3.3 mmol/L — ABNORMAL LOW (ref 3.5–5.1)
Sodium: 137 mmol/L (ref 135–145)

## 2023-05-11 LAB — TROPONIN I (HIGH SENSITIVITY)
Troponin I (High Sensitivity): 5 ng/L (ref <18)
Troponin I (High Sensitivity): 5 ng/L (ref <18)

## 2023-05-11 NOTE — Patient Instructions (Signed)

## 2023-05-11 NOTE — Progress Notes (Unsigned)
   Established Patient Office Visit   Subjective  Patient ID: Gerald Phillips, male    DOB: 06/07/1981  Age: 42 y.o. MRN: [id number]  No chief complaint on file.   He  has a past medical history of Anxiety, GERD (gastroesophageal reflux disease), Gout, and Hypertension.  HPI  ROS    Objective:     There were no vitals taken for this visit. {Vitals History (Optional):23777}  Physical Exam   No results found for any visits on 05/12/23.  The 10-year ASCVD risk score (Arnett DK, et al., 2019) is: 8.1%    Assessment & Plan:  There are no diagnoses linked to this encounter.  No follow-ups on file.   Cruzita Lederer Newman Nip, FNP
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
## 2023-05-12 ENCOUNTER — Other Ambulatory Visit: Payer: Self-pay | Admitting: Family Medicine

## 2023-05-12 ENCOUNTER — Ambulatory Visit (INDEPENDENT_AMBULATORY_CARE_PROVIDER_SITE_OTHER): Payer: Medicaid Other | Admitting: Family Medicine

## 2023-05-12 ENCOUNTER — Encounter: Payer: Self-pay | Admitting: Family Medicine

## 2023-05-12 VITALS — BP 149/103 | HR 72 | Ht 71.0 in | Wt 213.0 lb

## 2023-05-12 DIAGNOSIS — D509 Iron deficiency anemia, unspecified: Secondary | ICD-10-CM

## 2023-05-12 DIAGNOSIS — E559 Vitamin D deficiency, unspecified: Secondary | ICD-10-CM | POA: Diagnosis not present

## 2023-05-12 DIAGNOSIS — R42 Dizziness and giddiness: Secondary | ICD-10-CM

## 2023-05-12 DIAGNOSIS — I10 Essential (primary) hypertension: Secondary | ICD-10-CM | POA: Diagnosis not present

## 2023-05-12 NOTE — Assessment & Plan Note (Signed)
Vitals:   05/12/23 1035 05/12/23 1045  BP: (!) 156/115 (!) 149/103   Patient does not want to take his blood pressure medication , only want to continue with lifestyle changes Advise the importance of medication compliance to reduce cardiovascular complication- patient refused  Continued discussion on DASH diet, low sodium diet and maintain a exercise routine for 150 minutes per week.

## 2023-05-12 NOTE — Assessment & Plan Note (Signed)
Trial on meclizine12.5 PRN Labs ordered to rule out deficiency, Iron panel, Vit D Referral placed to Audiology Testing to evaluate inner ear function, as many vertigo cases are related to vestibular issues. Discussed , sleep with your head slightly elevated and rise slowly from bed in the morning to prevent sudden dizziness. Limiting salt, caffeine, and alcohol may also reduce fluid buildup in the inner ear, which can lessen vertigo episodes. Avoid positions or activities that trigger symptoms, such as bending over or quickly turning your head. Additionally, focus on slow, steady movements

## 2023-05-13 LAB — VITAMIN D 25 HYDROXY (VIT D DEFICIENCY, FRACTURES): Vit D, 25-Hydroxy: 47.1 ng/mL (ref 30.0–100.0)

## 2023-05-15 LAB — IRON,TIBC AND FERRITIN PANEL
Ferritin: 266 ng/mL (ref 30–400)
Iron Saturation: 30 % (ref 15–55)
Iron: 99 ug/dL (ref 38–169)
Total Iron Binding Capacity: 335 ug/dL (ref 250–450)
UIBC: 236 ug/dL (ref 111–343)

## 2023-05-15 LAB — SPECIMEN STATUS REPORT

## 2023-05-26 ENCOUNTER — Telehealth: Payer: No Typology Code available for payment source | Admitting: Physician Assistant

## 2023-05-26 DIAGNOSIS — R42 Dizziness and giddiness: Secondary | ICD-10-CM

## 2023-05-26 DIAGNOSIS — I10 Essential (primary) hypertension: Secondary | ICD-10-CM

## 2023-05-26 DIAGNOSIS — M5412 Radiculopathy, cervical region: Secondary | ICD-10-CM | POA: Diagnosis not present
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
## 2023-05-26 NOTE — Patient Instructions (Signed)
Martie Lee, thank you for joining Margaretann Loveless, PA-C for today's virtual visit.  While this provider is not your primary care provider (PCP), if your PCP is located in our provider database this encounter information will be shared with them immediately following your visit.   A [facility] MyChart account gives you access to today's visit and all your visits, tests, and labs performed at [facility] " click here if you don't have a [facility] MyChart account or go to [email or web address]  Consent: (Patient) Gerald Phillips provided verbal consent for this virtual visit at the beginning of the encounter.  Current Medications:  Current Outpatient Medications:    amLODipine (NORVASC) 10 MG tablet, Take 1 tablet (10 mg total) by mouth daily. (Patient not taking: Reported on 05/12/2023), Disp: 30 tablet, Rfl: 2   baclofen (LIORESAL) 10 MG tablet, Take 1 tablet (10 mg total) by mouth at bedtime. (Patient not taking: Reported on 05/12/2023), Disp: 30 each, Rfl: 0   cyclobenzaprine (FLEXERIL) 10 MG tablet, Take 1 tablet (10 mg total) by mouth 3 (three) times daily as needed for muscle spasms., Disp: 30 tablet, Rfl: 0   KRILL OIL PO, Take by mouth., Disp: , Rfl:    LORazepam (ATIVAN) 1 MG tablet, Take 1 tablet (1 mg total) by mouth daily., Disp: 7 tablet, Rfl: 0   Magnesium 400 MG CAPS, Take 1 capsule by mouth daily., Disp: , Rfl:    nitroGLYCERIN (NITROSTAT) 0.4 MG SL tablet, Place 1 tablet (0.4 mg total) under the tongue every 5 (five) minutes as needed for chest pain. (Patient not taking: Reported on 03/28/2023), Disp: 50 tablet, Rfl: 3   nortriptyline (PAMELOR) 10 MG capsule, Take 2 capsules (20 mg total) by mouth at bedtime. Take 1 capsule (10 mg) at bedtime for 1 week, then take 2 capsules (20 mg) thereafter (Patient not taking: Reported on 05/12/2023), Disp: 60 capsule, Rfl: 5   OVER THE COUNTER MEDICATION, Vitamin D3 with K2, Disp: , Rfl:    OVER THE COUNTER MEDICATION,  Take 1 tablet by mouth daily. Vitamin b6,folic acid,vitamin d and vitamin b12, Disp: , Rfl:    OVER THE COUNTER MEDICATION, Take 1 tablet by mouth daily. Scription BP, Disp: , Rfl:    Medications ordered in this encounter:  No orders of the defined types were placed in this encounter.    *If you need refills on other medications prior to your next appointment, please contact your pharmacy*  Follow-Up: Call back or seek an in-person evaluation if the symptoms worsen or if the condition fails to improve as anticipated.  [facility] [phone]  Other Instructions Managing Your Hypertension Hypertension, also called high blood pressure, is when the force of the blood pressing against the walls of the arteries is too strong. Arteries are blood vessels that carry blood from your heart throughout your body. Hypertension forces the heart to work harder to pump blood and may cause the arteries to become narrow or stiff. Understanding blood pressure readings A blood pressure reading includes a higher number over a lower number: The first, or top, number is called the systolic pressure. It is a measure of the pressure in your arteries as your heart beats. The second, or bottom number, is called the diastolic pressure. It is a measure of the pressure in your arteries as the heart relaxes. For most people, a normal blood pressure is below 120/80. Your personal target blood pressure may vary depending on your medical conditions,  your age, and other factors. Blood pressure is classified into four stages. Based on your blood pressure reading, your health care provider may use the following stages to determine what type of treatment you need, if any. Systolic pressure and diastolic pressure are measured in a unit called millimeters of mercury (mmHg). Normal Systolic pressure: below 120. Diastolic pressure: below 80. Elevated Systolic pressure: 120-129. Diastolic pressure: below  80. Hypertension stage 1 Systolic pressure: 130-139. Diastolic pressure: 80-89. Hypertension stage 2 Systolic pressure: 140 or above. Diastolic pressure: 90 or above. How can this condition affect me? Managing your hypertension is very important. Over time, hypertension can damage the arteries and decrease blood flow to parts of the body, including the brain, heart, and kidneys. Having untreated or uncontrolled hypertension can lead to: A heart attack. A stroke. A weakened blood vessel (aneurysm). Heart failure. Kidney damage. Eye damage. Memory and concentration problems. Vascular dementia. What actions can I take to manage this condition? Hypertension can be managed by making lifestyle changes and possibly by taking medicines. Your health care provider will help you make a plan to bring your blood pressure within a normal range. You may be referred for counseling on a healthy diet and physical activity. Nutrition  Eat a diet that is high in fiber and potassium, and low in salt (sodium), added sugar, and fat. An example eating plan is called the DASH diet. DASH stands for Dietary Approaches to Stop Hypertension. To eat this way: Eat plenty of fresh fruits and vegetables. Try to fill one-half of your plate at each meal with fruits and vegetables. Eat whole grains, such as whole-wheat pasta, brown rice, or whole-grain bread. Fill about one-fourth of your plate with whole grains. Eat low-fat dairy products. Avoid fatty cuts of meat, processed or cured meats, and poultry with skin. Fill about one-fourth of your plate with lean proteins such as fish, chicken without skin, beans, eggs, and tofu. Avoid pre-made and processed foods. These tend to be higher in sodium, added sugar, and fat. Reduce your daily sodium intake. Many people with hypertension should eat less than 1,500 mg of sodium a day. Lifestyle  Work with your health care provider to maintain a healthy body weight or to lose  weight. Ask what an ideal weight is for you. Get at least 30 minutes of exercise that causes your heart to beat faster (aerobic exercise) most days of the week. Activities may include walking, swimming, or biking. Include exercise to strengthen your muscles (resistance exercise), such as weight lifting, as part of your weekly exercise routine. Try to do these types of exercises for 30 minutes at least 3 days a week. Do not use any products that contain nicotine or tobacco. These products include cigarettes, chewing tobacco, and vaping devices, such as e-cigarettes. If you need help quitting, ask your health care provider. Control any long-term (chronic) conditions you have, such as high cholesterol or diabetes. Identify your sources of stress and find ways to manage stress. This may include meditation, deep breathing, or making time for fun activities. Alcohol use Do not drink alcohol if: Your health care provider tells you not to drink. You are pregnant, may be pregnant, or are planning to become pregnant. If you drink alcohol: Limit how much you have to: 0-1 drink a day for women. 0-2 drinks a day for men. Know how much alcohol is in your drink. In the [place] one drink equals one 12 oz bottle of beer (355 mL), one 5 oz glass of  wine (148 mL), or one 1 oz glass of hard liquor (44 mL). Medicines Your health care provider may prescribe medicine if lifestyle changes are not enough to get your blood pressure under control and if: Your systolic blood pressure is 130 or higher. Your diastolic blood pressure is 80 or higher. Take medicines only as told by your health care provider. Follow the directions carefully. Blood pressure medicines must be taken as told by your health care provider. The medicine does not work as well when you skip doses. Skipping doses also puts you at risk for problems. Monitoring Before you monitor your blood pressure: Do not smoke, drink caffeinated beverages, or exercise  within 30 minutes before taking a measurement. Use the bathroom and empty your bladder (urinate). Sit quietly for at least 5 minutes before taking measurements. Monitor your blood pressure at home as told by your health care provider. To do this: Sit with your back straight and supported. Place your feet flat on the floor. Do not cross your legs. Support your arm on a flat surface, such as a table. Make sure your upper arm is at heart level. Each time you measure, take two or three readings one minute apart and record the results. You may also need to have your blood pressure checked regularly by your health care provider. General information Talk with your health care provider about your diet, exercise habits, and other lifestyle factors that may be contributing to hypertension. Review all the medicines you take with your health care provider because there may be side effects or interactions. Keep all follow-up visits. Your health care provider can help you create and adjust your plan for managing your high blood pressure. Where to find more information National Heart, Lung, and Blood Institute: PopSteam.is American Heart Association: www.heart.org Contact a health care provider if: You think you are having a reaction to medicines you have taken. You have repeated (recurrent) headaches. You feel dizzy. You have swelling in your ankles. You have trouble with your vision. Get help right away if: You develop a severe headache or confusion. You have unusual weakness or numbness, or you feel faint. You have severe pain in your chest or abdomen. You vomit repeatedly. You have trouble breathing. These symptoms may be an emergency. Get help right away. Call 911. Do not wait to see if the symptoms will go away. Do not drive yourself to the hospital. Summary Hypertension is when the force of blood pumping through your arteries is too strong. If this condition is not controlled, it may  put you at risk for serious complications. Your personal target blood pressure may vary depending on your medical conditions, your age, and other factors. For most people, a normal blood pressure is less than 120/80. Hypertension is managed by lifestyle changes, medicines, or both. Lifestyle changes to help manage hypertension include losing weight, eating a healthy, low-sodium diet, exercising more, stopping smoking, and limiting alcohol. This information is not intended to replace advice given to you by your health care provider. Make sure you discuss any questions you have with your health care provider. Document Revised: 03/08/2021 Document Reviewed: 03/08/2021 Elsevier Patient Education  2024 Elsevier Inc.    If you have been instructed to have an in-person evaluation today at a local Urgent Care facility, please use the link below. It will take you to a list of all of our available [facility], including address, phone number and hours of operation. Please do not delay care.  [facility]  If you or a family member do not have a primary care provider, use the link below to schedule a visit and establish care. When you choose a [facility] primary care physician or advanced practice provider, you gain a long-term partner in health. Find a Primary Care Provider  Learn more about [facility]'s in-office and virtual care options: [place] - Get Care Now

## 2023-06-02 ENCOUNTER — Encounter: Payer: Self-pay | Admitting: Internal Medicine

## 2023-06-02 ENCOUNTER — Ambulatory Visit: Payer: No Typology Code available for payment source | Attending: Internal Medicine | Admitting: Internal Medicine

## 2023-06-02 VITALS — BP 142/102 | HR 81 | Ht 71.0 in | Wt 219.2 lb

## 2023-06-02 DIAGNOSIS — I10 Essential (primary) hypertension: Secondary | ICD-10-CM | POA: Diagnosis not present

## 2023-06-02 DIAGNOSIS — G4733 Obstructive sleep apnea (adult) (pediatric): Secondary | ICD-10-CM

## 2023-06-02 DIAGNOSIS — R0789 Other chest pain: Secondary | ICD-10-CM

## 2023-06-02 NOTE — Patient Instructions (Signed)
Medication Instructions:  Your physician recommends that you continue on your current medications as directed. Please refer to the Current Medication list given to you today.  *If you need a refill on your cardiac medications before your next appointment, please call your pharmacy*   Lab Work: None If you have labs (blood work) drawn today and your tests are completely normal, you will receive your results only by: MyChart Message (if you have MyChart) OR A paper copy in the mail If you have any lab test that is abnormal or we need to change your treatment, we will call you to review the results.   Testing/Procedures: None   Follow-Up: At [facility], you and your health needs are our priority.  As part of our continuing mission to provide you with exceptional heart care, we have created designated Provider Care Teams.  These Care Teams include your primary Cardiologist (physician) and Advanced Practice Providers (APPs -  Physician Assistants and Nurse Practitioners) who all work together to provide you with the care you need, when you need it.  We recommend signing up for the patient portal called "MyChart".  Sign up information is provided on this After Visit Summary.  MyChart is used to connect with patients for Virtual Visits (Telemedicine).  Patients are able to view lab/test results, encounter notes, upcoming appointments, etc.  Non-urgent messages can be sent to your provider as well.   To learn more about what you can do with MyChart, go to [email or web address].    Your next appointment:    Follow up as needed.   Provider:   You may see Vishnu P Mallipeddi, MD or one of the following Advanced Practice Providers on your designated Care Team:   [place], PA-C  Jacolyn Reedy, [place]     Other Instructions
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
## 2023-06-17 DIAGNOSIS — G4730 Sleep apnea, unspecified: Secondary | ICD-10-CM | POA: Diagnosis not present

## 2023-06-17 DIAGNOSIS — F411 Generalized anxiety disorder: Secondary | ICD-10-CM | POA: Diagnosis not present

## 2023-06-17 DIAGNOSIS — M62830 Muscle spasm of back: Secondary | ICD-10-CM | POA: Diagnosis not present

## 2023-06-17 DIAGNOSIS — I10 Essential (primary) hypertension: Secondary | ICD-10-CM | POA: Diagnosis not present

## 2023-06-20 ENCOUNTER — Ambulatory Visit (HOSPITAL_COMMUNITY): Payer: Medicaid Other | Admitting: Psychiatry
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
## 2023-07-25 ENCOUNTER — Ambulatory Visit: Payer: Medicaid Other | Admitting: Neurology
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
## 2023-08-06 ENCOUNTER — Ambulatory Visit (INDEPENDENT_AMBULATORY_CARE_PROVIDER_SITE_OTHER): Payer: No Typology Code available for payment source | Admitting: Neurology

## 2023-08-06 ENCOUNTER — Encounter: Payer: Self-pay | Admitting: Neurology

## 2023-08-06 VITALS — BP 116/112 | HR 74 | Ht 71.0 in | Wt 221.0 lb

## 2023-08-06 DIAGNOSIS — G4486 Cervicogenic headache: Secondary | ICD-10-CM | POA: Diagnosis not present

## 2023-08-06 DIAGNOSIS — M545 Low back pain, unspecified: Secondary | ICD-10-CM

## 2023-08-06 DIAGNOSIS — G43709 Chronic migraine without aura, not intractable, without status migrainosus: Secondary | ICD-10-CM | POA: Diagnosis not present

## 2023-08-06 DIAGNOSIS — G8929 Other chronic pain: Secondary | ICD-10-CM

## 2023-08-06 DIAGNOSIS — F32A Depression, unspecified: Secondary | ICD-10-CM

## 2023-08-06 DIAGNOSIS — F4310 Post-traumatic stress disorder, unspecified: Secondary | ICD-10-CM

## 2023-08-06 DIAGNOSIS — F419 Anxiety disorder, unspecified: Secondary | ICD-10-CM | POA: Diagnosis not present

## 2023-08-06 NOTE — Patient Instructions (Addendum)
More migraine information: Be aware of common food triggers:  - Caffeine:  coffee, black tea, cola, Mt. Dew  - Chocolate  - Dairy:  aged cheeses (brie, blue, cheddar, gouda, [place], provolone, [place], Swiss, etc), chocolate milk, buttermilk, sour cream, limit eggs and yogurt  - Nuts, peanut butter  - Alcohol  - Cereals/grains:  FRESH breads (fresh bagels, sourdough, doughnuts), yeast productions  - Processed/canned/aged/cured meats (pre-packaged deli meats, hotdogs)  - MSG/glutamate:  soy sauce, flavor enhancer, pickled/preserved/marinated foods  - Sweeteners:  aspartame (Equal, Nutrasweet).  Sugar and Splenda are okay  - Vegetables:  legumes (lima beans, lentils, snow peas, fava beans, pinto peans, peas, garbanzo beans), sauerkraut, onions, olives, pickles  - Fruit:  avocados, bananas, citrus fruit (orange, lemon, grapefruit), mango  - Other:  Frozen meals, macaroni and cheese Routine exercise Stay adequately hydrated (aim for 64 oz water daily) Keep headache diary Maintain proper stress management Maintain proper sleep hygiene Do not skip meals Consider supplements:  magnesium citrate 400mg  daily, riboflavin 400mg  daily, coenzyme Q10 100mg  three times daily.   Vitamins and herbs that show potential with headaches:  Magnesium: Magnesium (250 mg twice a day or 500 mg at bed) has a relaxant effect on smooth muscles such as blood vessels. Individuals suffering from frequent or daily headache usually have low magnesium levels which can be increase with daily supplementation of 400-750 mg. Three trials found 40-90% average headache reduction when used as a preventative. Magnesium also demonstrated the benefit in menstrually related migraine. Magnesium is part of the messenger system in the serotonin cascade and it is a good muscle relaxant. It is also useful for constipation which can be a side effect of other medications used to treat migraine. Good sources include nuts, whole grains, and  tomatoes.  Riboflavin (vitamin B 2) 200 mg twice a day. This vitamin assists nerve cells in the production of ATP a principal energy storing molecule. It is necessary for many chemical reactions in the body. There have been at least 3 clinical trials of riboflavin using 400 mg per day all of which suggested that migraine frequency can be decreased. All 3 trials showed significant improvement in over half of migraine sufferers. The supplement is found in bread, cereal, milk, meat, and poultry. Most Americans get more riboflavin than the recommended daily allowance, however riboflavin deficiency is not necessary for the supplements to help prevent headache.  Feverfew: Feverfew is a common garden herb native to [place] and popular in [place] as a treatment for disorders typically controlled by aspirin. The mechanism of action is unknown but is believed to be related to a chemical called parthenolide which helps the body use serotonin more effectively. Serotonin helps prevent migraine and assists with resolution when it occurs. Parthenolide also inhibits the release of histamine which is linked to pain and inflammation.  Consistency of active ingredients in different products can be a problem. Some formulations don't have the active ingredient (parthenolide) that prevents migraine. A parthenolide content of 0.2% is generally recommended. Typical dosage is one capsule 3 times a day.  Coenzyme Q10: This is present in almost all cells in the body and is critical component for the conversion of energy. Recent studies have shown that a nutritional supplement of CoQ10 can reduce the frequency of migraine attacks by improving the energy production of cells as with riboflavin. Doses of 150 mg twice a day have been shown to be effective.  Melatonin: Increasing evidence shows correlation between melatonin secretion and headache conditions. Melatonin  supplementation has decreased headache intensity and duration. It  is widely used as a sleep aid. Sleep is natures way of dealing with migraine. A dose of 3 mg is recommended to start for headaches including cluster headache. Higher doses up to 15 mg has been reviewed for use in Cluster headache and have been used. The rationale behind using melatonin for cluster is that many theories regarding the cause of Cluster headache center around the disruption of the normal circadian rhythm in the brain. This helps restore the normal circadian rhythm.  Ginger: Ginger has a small amount of antihistamine and anti-inflammatory action which may help headache. It is primarily used for nausea and may aid in the absorption of other medications.   ELECTROMYOGRAM AND NERVE CONDUCTION STUDIES (EMG/NCS) INSTRUCTIONS  How to Prepare The neurologist conducting the EMG will need to know if you have certain medical conditions. Tell the neurologist and other EMG lab personnel if you: Have a pacemaker or any other electrical medical device Take blood-thinning medications Have hemophilia, a blood-clotting disorder that causes prolonged bleeding Bathing Take a shower or bath shortly before your exam in order to remove oils from your skin. Don't apply lotions or creams before the exam.  What to Expect You'll likely be asked to change into a hospital gown for the procedure and lie down on an examination table. The following explanations can help you understand what will happen during the exam.  Electrodes. The neurologist or a technician places surface electrodes at various locations on your skin depending on where you're experiencing symptoms. Or the neurologist may insert needle electrodes at different sites depending on your symptoms.  Sensations. The electrodes will at times transmit a tiny electrical current that you may feel as a twinge or spasm. The needle electrode may cause discomfort or pain that usually ends shortly after the needle is removed. If you are concerned about  discomfort or pain, you may want to talk to the neurologist about taking a short break during the exam.  Instructions. During the needle EMG, the neurologist will assess whether there is any spontaneous electrical activity when the muscle is at rest - activity that isn't present in healthy muscle tissue - and the degree of activity when you slightly contract the muscle.  He or she will give you instructions on resting and contracting a muscle at appropriate times. Depending on what muscles and nerves the neurologist is examining, he or she may ask you to change positions during the exam.  After your EMG You may experience some temporary, minor bruising where the needle electrode was inserted into your muscle. This bruising should fade within several days. If it persists, contact your primary care doctor.

## 2023-08-27 ENCOUNTER — Telehealth: Payer: No Typology Code available for payment source

## 2023-09-10 ENCOUNTER — Ambulatory Visit: Payer: No Typology Code available for payment source | Admitting: Family Medicine

## 2023-09-10 ENCOUNTER — Telehealth: Payer: Self-pay | Admitting: Family Medicine

## 2023-09-10 DIAGNOSIS — I10 Essential (primary) hypertension: Secondary | ICD-10-CM

## 2023-09-10 DIAGNOSIS — R7303 Prediabetes: Secondary | ICD-10-CM | POA: Diagnosis not present

## 2023-09-10 DIAGNOSIS — M5412 Radiculopathy, cervical region: Secondary | ICD-10-CM | POA: Diagnosis not present

## 2023-09-10 MED ORDER — PREDNISONE 20 MG PO TABS: 20.0000 mg | ORAL_TABLET | Freq: Two times a day (BID) | ORAL | 0 refills | Status: AC

## 2023-09-10 MED ORDER — AMLODIPINE BESYLATE 10 MG PO TABS
10.0000 mg | ORAL_TABLET | Freq: Every day | ORAL | 2 refills | Status: AC
Start: 2023-09-10 — End: ?

## 2023-09-10 MED ORDER — BLOOD PRESSURE KIT: PACK | 0 refills | Status: AC

## 2023-09-10 NOTE — Progress Notes (Signed)
 Virtual Visit via Video Note  I connected with Gerald Phillips on 09/10/23 at 10:40 AM EST by a video enabled telemedicine application and verified that I am speaking with the correct person using two identifiers.  Patient Location: Home Provider Location: Office/Clinic  I discussed the limitations, risks, security, and privacy concerns of performing an evaluation and management service by video and the availability of in person appointments. I also discussed with the patient that there may be a patient responsible charge related to this service. The patient expressed understanding and agreed to proceed.  Subjective: PCP: Rica Records, FNP  No chief complaint on file.  Patient presents via telehealth for chronic follow up. For the details of today's visit, please refer to assessment and plan.      ROS: Per HPI  Current Outpatient Medications:    amLODipine (NORVASC) 10 MG tablet, Take 1 tablet (10 mg total) by mouth daily., Disp: 90 tablet, Rfl: 2   Blood Pressure KIT, Use once daily, Disp: 1 kit, Rfl: 0   predniSONE (DELTASONE) 20 MG tablet, Take 1 tablet (20 mg total) by mouth 2 (two) times daily with a meal for 5 days., Disp: 10 tablet, Rfl: 0   baclofen (LIORESAL) 10 MG tablet, Take 1 tablet (10 mg total) by mouth at bedtime. (Patient not taking: Reported on 08/06/2023), Disp: 30 each, Rfl: 0   cyanocobalamin (VITAMIN B12) 1000 MCG tablet, Take 1,000 mcg by mouth daily., Disp: , Rfl:    cyclobenzaprine (FLEXERIL) 10 MG tablet, Take 1 tablet (10 mg total) by mouth 3 (three) times daily as needed for muscle spasms., Disp: 30 tablet, Rfl: 0   KRILL OIL PO, Take by mouth. (Patient not taking: Reported on 08/06/2023), Disp: , Rfl:    LORazepam (ATIVAN) 1 MG tablet, Take 1 tablet (1 mg total) by mouth daily. (Patient not taking: Reported on 08/06/2023), Disp: 7 tablet, Rfl: 0   Magnesium 400 MG CAPS, Take 1 capsule by mouth daily., Disp: , Rfl:    nitroGLYCERIN (NITROSTAT)  0.4 MG SL tablet, Place 1 tablet (0.4 mg total) under the tongue every 5 (five) minutes as needed for chest pain. (Patient not taking: Reported on 08/06/2023), Disp: 50 tablet, Rfl: 3   nortriptyline (PAMELOR) 10 MG capsule, Take 2 capsules (20 mg total) by mouth at bedtime. Take 1 capsule (10 mg) at bedtime for 1 week, then take 2 capsules (20 mg) thereafter (Patient not taking: Reported on 08/06/2023), Disp: 60 capsule, Rfl: 5   OVER THE COUNTER MEDICATION, Vitamin D3 with K2, Disp: , Rfl:    OVER THE COUNTER MEDICATION, Take 1 tablet by mouth daily. Vitamin b6,folic acid,vitamin d and vitamin b12 (Patient not taking: Reported on 08/06/2023), Disp: , Rfl:    OVER THE COUNTER MEDICATION, Take 1 tablet by mouth daily. Scription BP (Patient not taking: Reported on 08/06/2023), Disp: , Rfl:   Observations/Objective: There were no vitals filed for this visit. Physical Exam Patient is alert and no acute distress noted.   Assessment and Plan: Primary hypertension Assessment & Plan: Previous office visit blood pressure not controlled due to medication non compliance Patient reports does not have a blood pressure kit at home Advise the importance of routinely monitoring blood pressure checks at home Ordered Blood pressure kit to his pharmacy Continue taking Amlodipine 10 mg once daily Follow up via my chart in 2 weeks with at home blood pressure readings Labs ordered. Discussed with  patient to monitor their blood pressure regularly and  maintain a heart-healthy diet rich in fruits, vegetables, whole grains, and low-fat dairy, while reducing sodium intake to less than 2,300 mg per day. Regular physical activity, such as 30 minutes of moderate exercise most days of the week, will help lower blood pressure and improve overall cardiovascular health. Avoiding smoking, limiting alcohol consumption, and managing stress. Take  prescribed medication, & take it as directed and avoid skipping doses. Seek emergency  care if your blood pressure is (over 180/100) or you experience chest pain, shortness of breath, or sudden vision changes.Patient verbalizes understanding regarding plan of care and all questions answered.   Orders: -     BMP8+eGFR -     CBC with Differential/Platelet -     Lipid panel  Prediabetes -     Hemoglobin A1c  Cervical radiculopathy Assessment & Plan: Reviewed MRI results with patient I explained to the patient that non-pharmacological interventions include the application of ice or heat, rest, and recommended range of motion exercises along with gentle stretching. For pain management, Flexeril was advised. The patient was instructed to follow up if symptoms worsen or persist. The patient verbalized understanding of the care plan, and all questions were answered.  Advise to follow up with Orthopedics and Ambulatory referral to Physical Medicine Rehab    Other orders -     amLODIPine Besylate; Take 1 tablet (10 mg total) by mouth daily.  Dispense: 90 tablet; Refill: 2 -     Blood Pressure; Use once daily  Dispense: 1 kit; Refill: 0 -     predniSONE; Take 1 tablet (20 mg total) by mouth 2 (two) times daily with a meal for 5 days.  Dispense: 10 tablet; Refill: 0    Follow Up Instructions: No follow-ups on file.   I discussed the assessment and treatment plan with the patient. The patient was provided an opportunity to ask questions, and all were answered. The patient agreed with the plan and demonstrated an understanding of the instructions.   The patient was advised to call back or seek an in-person evaluation if the symptoms worsen or if the condition fails to improve as anticipated.  The above assessment and management plan was discussed with the patient. The patient verbalized understanding of and has agreed to the management plan.   Cruzita Lederer Newman Nip, FNP

## 2023-09-10 NOTE — Assessment & Plan Note (Signed)
 Previous office visit blood pressure not controlled due to medication non compliance Patient reports does not have a blood pressure kit at home Advise the importance of routinely monitoring blood pressure checks at home Ordered Blood pressure kit to his pharmacy Continue taking Amlodipine 10 mg once daily Follow up via my chart in 2 weeks with at home blood pressure readings Labs ordered. Discussed with  patient to monitor their blood pressure regularly and maintain a heart-healthy diet rich in fruits, vegetables, whole grains, and low-fat dairy, while reducing sodium intake to less than 2,300 mg per day. Regular physical activity, such as 30 minutes of moderate exercise most days of the week, will help lower blood pressure and improve overall cardiovascular health. Avoiding smoking, limiting alcohol consumption, and managing stress. Take  prescribed medication, & take it as directed and avoid skipping doses. Seek emergency care if your blood pressure is (over 180/100) or you experience chest pain, shortness of breath, or sudden vision changes.Patient verbalizes understanding regarding plan of care and all questions answered.

## 2023-09-10 NOTE — Assessment & Plan Note (Signed)
 Reviewed MRI results with patient I explained to the patient that non-pharmacological interventions include the application of ice or heat, rest, and recommended range of motion exercises along with gentle stretching. For pain management, Flexeril was advised. The patient was instructed to follow up if symptoms worsen or persist. The patient verbalized understanding of the care plan, and all questions were answered.  Advise to follow up with Orthopedics and Ambulatory referral to Physical Medicine Rehab

## 2023-09-16 DIAGNOSIS — I10 Essential (primary) hypertension: Secondary | ICD-10-CM | POA: Diagnosis not present

## 2023-09-16 DIAGNOSIS — R7303 Prediabetes: Secondary | ICD-10-CM | POA: Diagnosis not present

## 2023-09-16 DIAGNOSIS — Z125 Encounter for screening for malignant neoplasm of prostate: Secondary | ICD-10-CM | POA: Diagnosis not present

## 2023-09-16 DIAGNOSIS — F411 Generalized anxiety disorder: Secondary | ICD-10-CM | POA: Diagnosis not present

## 2023-09-16 DIAGNOSIS — R071 Chest pain on breathing: Secondary | ICD-10-CM | POA: Diagnosis not present

## 2023-09-16 DIAGNOSIS — M4802 Spinal stenosis, cervical region: Secondary | ICD-10-CM | POA: Diagnosis not present

## 2023-09-16 DIAGNOSIS — Z0000 Encounter for general adult medical examination without abnormal findings: Secondary | ICD-10-CM | POA: Diagnosis not present

## 2023-10-02 DIAGNOSIS — M4722 Other spondylosis with radiculopathy, cervical region: Secondary | ICD-10-CM | POA: Diagnosis not present

## 2023-10-02 DIAGNOSIS — Z6830 Body mass index (BMI) 30.0-30.9, adult: Secondary | ICD-10-CM | POA: Diagnosis not present

## 2023-10-02 DIAGNOSIS — M546 Pain in thoracic spine: Secondary | ICD-10-CM | POA: Diagnosis not present

## 2023-10-02 DIAGNOSIS — M542 Cervicalgia: Secondary | ICD-10-CM | POA: Diagnosis not present

## 2023-10-02 DIAGNOSIS — M4802 Spinal stenosis, cervical region: Secondary | ICD-10-CM | POA: Diagnosis not present

## 2023-10-02 DIAGNOSIS — I10 Essential (primary) hypertension: Secondary | ICD-10-CM | POA: Diagnosis not present

## 2023-10-02 DIAGNOSIS — F411 Generalized anxiety disorder: Secondary | ICD-10-CM | POA: Diagnosis not present

## 2023-10-09 ENCOUNTER — Telehealth: Payer: Self-pay | Admitting: Orthopedic Surgery

## 2023-10-09 NOTE — Telephone Encounter (Signed)
 Patient called and said that the MRI referral has closed and [facility] need it faxed over to them. CB#[phone]

## 2023-10-14 ENCOUNTER — Other Ambulatory Visit (HOSPITAL_COMMUNITY): Payer: Self-pay | Admitting: Neurosurgery

## 2023-10-14 DIAGNOSIS — M546 Pain in thoracic spine: Secondary | ICD-10-CM

## 2023-10-16 ENCOUNTER — Ambulatory Visit (HOSPITAL_COMMUNITY)
Admission: RE | Admit: 2023-10-16 | Discharge: 2023-10-16 | Disposition: A | Discharge status: Home / Self Care | Source: Ambulatory Visit | Attending: Neurosurgery | Admitting: Neurosurgery

## 2023-10-16 DIAGNOSIS — M50323 Other cervical disc degeneration at C6-C7 level: Secondary | ICD-10-CM | POA: Diagnosis not present

## 2023-10-16 DIAGNOSIS — M546 Pain in thoracic spine: Secondary | ICD-10-CM | POA: Insufficient documentation

## 2023-10-16 DIAGNOSIS — M5134 Other intervertebral disc degeneration, thoracic region: Secondary | ICD-10-CM | POA: Diagnosis not present

## 2023-10-16 DIAGNOSIS — M5124 Other intervertebral disc displacement, thoracic region: Secondary | ICD-10-CM | POA: Diagnosis not present

## 2023-11-03 ENCOUNTER — Telehealth: Admitting: Physician Assistant

## 2023-11-03 DIAGNOSIS — Z91199 Patient's noncompliance with other medical treatment and regimen due to unspecified reason: Secondary | ICD-10-CM

## 2023-11-03 NOTE — Progress Notes (Signed)
The patient no-showed for appointment despite this provider sending direct link with no response and waiting for at least 10 minutes from appointment time for patient to join. They will be marked as a NS for this appointment/time.   Maryon Kemnitz M Kenzie Flakes, PA-C    

## 2023-11-11 DIAGNOSIS — R030 Elevated blood-pressure reading, without diagnosis of hypertension: Secondary | ICD-10-CM | POA: Diagnosis not present

## 2023-11-11 DIAGNOSIS — M7031 Other bursitis of elbow, right elbow: Secondary | ICD-10-CM | POA: Diagnosis not present

## 2023-11-11 DIAGNOSIS — Z6831 Body mass index (BMI) 31.0-31.9, adult: Secondary | ICD-10-CM | POA: Diagnosis not present

## 2023-11-11 DIAGNOSIS — E669 Obesity, unspecified: Secondary | ICD-10-CM | POA: Diagnosis not present

## 2023-11-18 DIAGNOSIS — M5124 Other intervertebral disc displacement, thoracic region: Secondary | ICD-10-CM | POA: Diagnosis not present

## 2023-11-18 DIAGNOSIS — Z6830 Body mass index (BMI) 30.0-30.9, adult: Secondary | ICD-10-CM | POA: Diagnosis not present

## 2023-11-25 ENCOUNTER — Other Ambulatory Visit: Payer: Self-pay

## 2023-11-25 ENCOUNTER — Emergency Department (HOSPITAL_COMMUNITY)

## 2023-11-25 ENCOUNTER — Encounter (HOSPITAL_COMMUNITY): Payer: Self-pay

## 2023-11-25 ENCOUNTER — Emergency Department (HOSPITAL_COMMUNITY)
Admission: EM | Admit: 2023-11-25 | Discharge: 2023-11-26 | Disposition: A | Discharge status: Home / Self Care | Attending: Emergency Medicine | Admitting: Emergency Medicine

## 2023-11-25 DIAGNOSIS — R0782 Intercostal pain: Secondary | ICD-10-CM | POA: Diagnosis not present

## 2023-11-25 DIAGNOSIS — Z87891 Personal history of nicotine dependence: Secondary | ICD-10-CM | POA: Diagnosis not present

## 2023-11-25 DIAGNOSIS — R079 Chest pain, unspecified: Secondary | ICD-10-CM | POA: Insufficient documentation

## 2023-11-25 DIAGNOSIS — E669 Obesity, unspecified: Secondary | ICD-10-CM | POA: Diagnosis not present

## 2023-11-25 DIAGNOSIS — R0789 Other chest pain: Secondary | ICD-10-CM | POA: Diagnosis not present

## 2023-11-25 DIAGNOSIS — I10 Essential (primary) hypertension: Secondary | ICD-10-CM | POA: Diagnosis not present

## 2023-11-25 DIAGNOSIS — Z6831 Body mass index (BMI) 31.0-31.9, adult: Secondary | ICD-10-CM | POA: Diagnosis not present

## 2023-11-25 LAB — CBC
HCT: 44.3 % (ref 39.0–52.0)
Hemoglobin: 15 g/dL (ref 13.0–17.0)
MCH: 32.8 pg (ref 26.0–34.0)
MCHC: 33.9 g/dL (ref 30.0–36.0)
MCV: 96.9 fL (ref 80.0–100.0)
Platelets: 163 10*3/uL (ref 150–400)
RBC: 4.57 MIL/uL (ref 4.22–5.81)
RDW: 12.5 % (ref 11.5–15.5)
WBC: 6.7 10*3/uL (ref 4.0–10.5)
nRBC: 0 % (ref 0.0–0.2)

## 2023-11-25 LAB — TROPONIN I (HIGH SENSITIVITY): Troponin I (High Sensitivity): 5 ng/L (ref <18)

## 2023-11-25 NOTE — ED Triage Notes (Signed)
 Pt to ED with c/o chest pain that started a couple of days ago. Pt also reports pain to left temple that "pulsates" reports burning sensation across chest.

## 2023-11-26 LAB — BASIC METABOLIC PANEL WITH GFR
Anion gap: 10 (ref 5–15)
BUN: 15 mg/dL (ref 6–20)
CO2: 24 mmol/L (ref 22–32)
Calcium: 9.5 mg/dL (ref 8.9–10.3)
Chloride: 100 mmol/L (ref 98–111)
Creatinine, Ser: 1.05 mg/dL (ref 0.61–1.24)
GFR, Estimated: >60 mL/min (ref >60)
Glucose, Bld: 73 mg/dL (ref 70–99)
Potassium: 3.6 mmol/L (ref 3.5–5.1)
Sodium: 141 mmol/L (ref 135–145)

## 2023-11-26 LAB — TROPONIN I (HIGH SENSITIVITY): Troponin I (High Sensitivity): 5 ng/L (ref <18)

## 2023-11-26 MED ORDER — HYDROCODONE-ACETAMINOPHEN 5-325 MG PO TABS: 1.0000 | ORAL_TABLET | ORAL | 0 refills | Status: AC | PRN

## 2023-11-26 MED ORDER — NAPROXEN 500 MG PO TABS: 500.0000 mg | ORAL_TABLET | Freq: Two times a day (BID) | ORAL | 0 refills | Status: AC

## 2023-11-26 NOTE — Discharge Instructions (Signed)
 You were evaluated in the Emergency Department and after careful evaluation, we did not find any emergent condition requiring admission or further testing in the hospital.  Your exam/testing today is overall reassuring.  Symptoms seem to be due to muscular strain or spasm or referred pain from your spine.  Continue follow-up with your neurosurgeon or spine expert.  Recommend use of the Naprosyn  anti-inflammatory twice daily for pain.  Can use the Norco for more significant pain.  Do not mix the Norco with your home muscle relaxers.  Please return to the Emergency Department if you experience any worsening of your condition.   Thank you for allowing us  to be a part of your care.

## 2023-11-26 NOTE — ED Notes (Signed)
 ED Provider at bedside.

## 2023-11-26 NOTE — ED Provider Notes (Signed)
 [facility]ERGENCY DEPT [facility] Emergency Department Provider Note MRN:  [id number]  Arrival date & time: 11/26/23     Chief Complaint   Chest Pain   History of Present Illness   Gerald Phillips is a 43 y.o. year-old male with a history of hypertension presenting to the ED with chief complaint of chest pain.  Pain in the left upper chest intermittent not going away over the past few days, worse with certain movements or positions.  Denies shortness of breath, no dizziness or sweatiness, no nausea vomiting, no shortness of breath, no leg pain or swelling recently.  Explains that he has issues with his thoracic spine, has undergone MRI imaging and follows up with neurosurgery soon.  Review of Systems  A thorough review of systems was obtained and all systems are negative except as noted in the HPI and PMH.   Patient's Health History    Past Medical History:  Diagnosis Date   Anxiety    GERD (gastroesophageal reflux disease)    Gout    Hypertension     Past Surgical History:  Procedure Laterality Date   LAPAROTOMY N/A 08/25/2019   Procedure: EXPLORATORY LAPAROTOMY, RENAL EXPLORATION CLOSURE OF RETROPERITONEUM;  Surgeon: Dareen Ebbing, MD;  Location: [facility] OR;  Service: General;  Laterality: N/A;    Family History  Problem Relation Age of Onset   Cancer Maternal Grandmother    Heart attack Maternal Grandfather     Social History   Socioeconomic History   Marital status: Married    Spouse name: Not on file   Number of children: Not on file   Years of education: Not on file   Highest education level: Not on file  Occupational History   Not on file  Tobacco Use   Smoking status: Former    Current packs/day: 0.50    Types: Cigars, Cigarettes   Smokeless tobacco: Never  Vaping Use   Vaping status: Never Used  Substance and Sexual Activity   Alcohol use: Not Currently    Comment: occasionally   Drug use: Not Currently   Sexual activity: Yes  Other Topics Concern    Not on file  Social History Narrative   ** Merged History Encounter **       Are you right handed or left handed? Right   Are you currently employed ? 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 no   Who lives with you? Wife 2 kids   What type of home do you live in: 1 story or 2 story?  1 story     Caffiene rarely   Social Drivers of Corporate investment banker Strain: Not on file  Food Insecurity: Not on file  Transportation Needs: Not on file  Physical Activity: Not on file  Stress: Not on file  Social Connections: Unknown (11/10/2021)   Received from [facility]   Social Network    Social Network: Not on file  Intimate Partner Violence: Unknown (10/08/2021)   Received from [facility], [facility]   HITS    Physically Hurt: Not on file    Insult or Talk Down To: Not on file    Threaten Physical Harm: Not on file    Scream or Curse: Not on file     Physical Exam   Vitals:   11/25/23 2052  BP: (!) 151/115  Pulse: 65  Resp: 18  Temp: 98.7 F (37.1 C)  SpO2: 98%    CONSTITUTIONAL: Well-appearing, NAD NEURO/PSYCH:  Alert and oriented x 3, no focal deficits EYES:  eyes equal and reactive ENT/NECK:  no LAD, no JVD CARDIO: Regular rate, well-perfused, normal S1 and S2 PULM:  CTAB no wheezing or rhonchi GI/GU:  non-distended, non-tender MSK/SPINE:  No gross deformities, no edema SKIN:  no rash, atraumatic   *Additional and/or pertinent findings included in MDM below  Diagnostic and Interventional Summary    EKG Interpretation Date/Time:  Tuesday Nov 25 2023 20:50:49 EDT Ventricular Rate:  62 PR Interval:  166 QRS Duration:  74 QT Interval:  390 QTC Calculation: 395 R Axis:   31  Text Interpretation: Normal sinus rhythm ST & T wave abnormality, consider inferior ischemia Abnormal ECG When compared with ECG of 11-May-2023 01:01, PREVIOUS ECG IS PRESENT Confirmed by Gwenetta Lennert [phone]) on 11/26/2023  12:23:45 AM       Labs Reviewed  BASIC METABOLIC PANEL WITH GFR  CBC  TROPONIN I (HIGH SENSITIVITY)  TROPONIN I (HIGH SENSITIVITY)    DG Chest 2 View  Final Result      Medications - No data to display   Procedures  /  Critical Care Procedures  ED Course and Medical Decision Making  Initial Impression and Ddx Well-appearing in no acute distress, reassuring vital signs, lungs clear, focal pain and tenderness suggestive of costochondritis.  Per chart review patient has some thoracic spinal stenosis that could be contributing to the pain.  Highly doubt ACS, doubt PE, doubt dissection.  Past medical/surgical history that increases complexity of ED encounter: None  Interpretation of Diagnostics I personally reviewed the EKG and my interpretation is as follows: Sinus rhythm, no significant ischemic findings  No significant blood count or electrolyte disturbance.  Troponin negative x 2  Patient Reassessment and Ultimate Disposition/Management     Discharge  Patient management required discussion with the following services or consulting groups:  None  Complexity of Problems Addressed Acute illness or injury that poses threat of life of bodily function  Additional Data Reviewed and Analyzed Further history obtained from: Prior labs/imaging results  Additional Factors Impacting ED Encounter Risk Consideration of hospitalization  Gerald Abe. Harless Lien, MD [facility] Emergency Medicine [facility] [email or web address]  Final Clinical Impressions(s) / ED Diagnoses     ICD-10-CM   1. Chest pain, unspecified type  R07.9       ED Discharge Orders          Ordered    naproxen  (NAPROSYN ) 500 MG tablet  2 times daily        11/26/23 0105    HYDROcodone-acetaminophen  (NORCO/VICODIN) 5-325 MG tablet  Every 4 hours PRN        11/26/23 0105             Discharge Instructions Discussed with and Provided to Patient:     Discharge Instructions      You  were evaluated in the Emergency Department and after careful evaluation, we did not find any emergent condition requiring admission or further testing in the hospital.  Your exam/testing today is overall reassuring.  Symptoms seem to be due to muscular strain or spasm or referred pain from your spine.  Continue follow-up with your neurosurgeon or spine expert.  Recommend use of the Naprosyn  anti-inflammatory twice daily for pain.  Can use the Norco for more significant pain.  Do not mix the Norco with your home muscle relaxers.  Please return to the Emergency Department if you experience any  worsening of your condition.   Thank you for allowing us  to be a part of your care.      Edson Graces, MD 11/26/23 [phone]

## 2023-11-28 ENCOUNTER — Ambulatory Visit: Admitting: Orthopedic Surgery

## 2023-11-28 ENCOUNTER — Other Ambulatory Visit (INDEPENDENT_AMBULATORY_CARE_PROVIDER_SITE_OTHER): Payer: Self-pay

## 2023-11-28 VITALS — BP 134/92 | HR 66 | Ht 70.0 in | Wt 212.0 lb

## 2023-11-28 DIAGNOSIS — M546 Pain in thoracic spine: Secondary | ICD-10-CM | POA: Insufficient documentation

## 2023-11-28 DIAGNOSIS — M25521 Pain in right elbow: Secondary | ICD-10-CM | POA: Diagnosis not present

## 2023-11-28 DIAGNOSIS — M5124 Other intervertebral disc displacement, thoracic region: Secondary | ICD-10-CM | POA: Insufficient documentation

## 2023-11-28 DIAGNOSIS — M7021 Olecranon bursitis, right elbow: Secondary | ICD-10-CM

## 2023-11-29 ENCOUNTER — Encounter: Payer: Self-pay | Admitting: Orthopedic Surgery

## 2023-11-29 NOTE — Progress Notes (Signed)
 Orthopaedic Clinic Return  Assessment: Gerald Phillips is a 43 y.o. male with the following: Right olecranon bursitis  Plan: Swelling of right elbow consistent with olecranon bursitis. No concern for infection Improving, no role for aspiration at this time Medications as needed Compression to help prevent worsening Consider elbow pad if persists and painful Follow up as needed.   Follow-up: Return if symptoms worsen or fail to improve.   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 No pain and nodule has gone down in size.     History of Present Illness: Gerald Phillips is a 43 y.o. male who returns to clinic for evaluation of right elbow pain.   Over the past couple months, he has noticed some swelling in the right elbow.  He plays piano for living, and spends a lot of time practicing.  Otherwise, no specific injury.  He was performing a couple of weeks ago, when his wife noticed a lot of swelling.  It is not painful.  It does not bother him.  He was evaluated at an urgent care facility, and reassurance was provided.  Since this initial evaluation, the swelling has gotten better.  He denies fevers and chills.  No other signs of an infection.  Review of Systems: No fevers or chills No numbness or tingling No chest pain No shortness of breath No bowel or bladder dysfunction No GI distress No headaches   Objective: BP (!) 134/92   Pulse 66   Ht 5\' 10"  (1.778 m)   Wt 212 lb (96.2 kg)   BMI 30.42 kg/m   Physical Exam:  Alert and oriented.  No acute distress.  Right elbow with mild swelling over the olecranon.  No redness.  Mild tenderness.  Minimal fluid is appreciated.  He has full range of motion of the right elbow.  Sensation intact throughout the right hand.  IMAGING: I personally ordered and reviewed the following images:  X-rays of the right  elbow were obtained in clinic today.  No acute injuries are noted.  Well-maintained joint space throughout the right elbow.  No bony lesions.  No injuries.  Soft tissue swelling over the olecranon.  Impression: Negative right elbow x-ray   Tonita Frater, MD 11/29/2023 11:29 PM

## 2023-12-11 ENCOUNTER — Encounter: Payer: Self-pay | Admitting: Internal Medicine

## 2023-12-11 ENCOUNTER — Ambulatory Visit: Attending: Internal Medicine | Admitting: Internal Medicine

## 2023-12-11 VITALS — BP 124/82 | HR 76 | Ht 70.0 in | Wt 210.0 lb

## 2023-12-11 DIAGNOSIS — R079 Chest pain, unspecified: Secondary | ICD-10-CM | POA: Diagnosis not present

## 2023-12-11 DIAGNOSIS — G4733 Obstructive sleep apnea (adult) (pediatric): Secondary | ICD-10-CM

## 2023-12-11 NOTE — Progress Notes (Signed)
 Cardiology Office Note  Date: 12/11/2023   ID: Gerald Phillips, DOB 1981/05/22, MRN [id number]  PCP:  Rosanna Comment, FNP  Cardiologist:  Lasalle Pointer, MD Electrophysiologist:  None    History of Present Illness: Gerald Phillips is a 43 y.o. male known to have HTN, gout, GERD is here for follow-up of chest pain.  All the cardiac workup was unremarkable including CT cardiac, echocardiogram and 2-week event monitor.  Patient having nonspecific chest pains.  Mostly at night and sometimes while driving, he developed burning type of pain across his whole chest, especially lower part of his chest.  No chest pain with exertion.  Smokes 10 cigarettes/day.  He had ER visit recently as he has severe chest pain, felt like a band across his chest and not able to catch his breath prompting ER visit.  Troponins and EKG within normal limits.  He was then scheduled to be seen in the heart cardiology clinic.  He has a history of GERD, he reported that he tried multiple medications in the past but did not have any relief.  He is currently taking baking soda once in a while which is improving his symptoms.   Past Medical History:  Diagnosis Date   Anxiety    GERD (gastroesophageal reflux disease)    Gout    Hypertension     Past Surgical History:  Procedure Laterality Date   LAPAROTOMY N/A 08/25/2019   Procedure: EXPLORATORY LAPAROTOMY, RENAL EXPLORATION CLOSURE OF RETROPERITONEUM;  Surgeon: Dareen Ebbing, MD;  Location: [facility] OR;  Service: General;  Laterality: N/A;    Current Outpatient Medications  Medication Sig Dispense Refill   amLODipine  (NORVASC ) 10 MG tablet Take 1 tablet (10 mg total) by mouth daily. 90 tablet 2   baclofen  (LIORESAL ) 10 MG tablet Take 1 tablet (10 mg total) by mouth at bedtime. 30 each 0   Blood Pressure KIT Use once daily 1 kit 0   cyanocobalamin  (VITAMIN B12) 1000 MCG tablet Take 1,000 mcg by mouth daily.     cyclobenzaprine  (FLEXERIL ) 10 MG tablet Take 1  tablet (10 mg total) by mouth 3 (three) times daily as needed for muscle spasms. 30 tablet 0   HYDROcodone -acetaminophen  (NORCO/VICODIN) 5-325 MG tablet Take 1 tablet by mouth every 4 (four) hours as needed. 6 tablet 0   KRILL OIL PO Take by mouth.     LORazepam  (ATIVAN ) 1 MG tablet Take 1 tablet (1 mg total) by mouth daily. 7 tablet 0   Magnesium 400 MG CAPS Take 1 capsule by mouth daily.     naproxen  (NAPROSYN ) 500 MG tablet Take 1 tablet (500 mg total) by mouth 2 (two) times daily. 30 tablet 0   nitroGLYCERIN  (NITROSTAT ) 0.4 MG SL tablet Place 1 tablet (0.4 mg total) under the tongue every 5 (five) minutes as needed for chest pain. 50 tablet 3   nortriptyline  (PAMELOR ) 10 MG capsule Take 2 capsules (20 mg total) by mouth at bedtime. Take 1 capsule (10 mg) at bedtime for 1 week, then take 2 capsules (20 mg) thereafter 60 capsule 5   OVER THE COUNTER MEDICATION Vitamin D3 with K2     OVER THE COUNTER MEDICATION Take 1 tablet by mouth daily. Vitamin b6,folic acid ,vitamin d  and vitamin b12     OVER THE COUNTER MEDICATION Take 1 tablet by mouth daily. Scription BP     No current facility-administered medications for this visit.   Allergies:  Atarax  [hydroxyzine ], Penicillins, and Penicillins   Social  History: The patient  reports that he has quit smoking. His smoking use included cigars and cigarettes. He has never used smokeless tobacco. He reports that he does not currently use alcohol. He reports that he does not currently use drugs.   Family History: The patient's family history includes Cancer in his maternal grandmother; Heart attack in his maternal grandfather.   ROS:  Please see the history of present illness. Otherwise, complete review of systems is positive for none.  All other systems are reviewed and negative.   Physical Exam: VS:  BP 124/82 (BP Location: Left Arm, Patient Position: Sitting, Cuff Size: Normal)   Pulse 76   Ht 5\' 10"  (1.778 m)   Wt 210 lb (95.3 kg)   BMI 30.13  kg/m , BMI Body mass index is 30.13 kg/m.  Wt Readings from Last 3 Encounters:  12/11/23 210 lb (95.3 kg)  11/28/23 212 lb (96.2 kg)  11/25/23 218 lb (98.9 kg)    General: Patient appears comfortable at rest. HEENT: Conjunctiva and lids normal, oropharynx clear with moist mucosa. Neck: Supple, no elevated JVP or carotid bruits, no thyromegaly. Lungs: Clear to auscultation, nonlabored breathing at rest. Cardiac: Regular rate and rhythm, no S3 or significant systolic murmur, no pericardial rub. Abdomen: Soft, nontender, no hepatomegaly, bowel sounds present, no guarding or rebound. Extremities: No pitting edema, distal pulses 2+. Skin: Warm and dry. Musculoskeletal: No kyphosis. Neuropsychiatric: Alert and oriented x3, affect grossly appropriate.  ECG:  NSR and no ST changes  Recent Labwork: 03/30/2023: ALT 20; AST 19 11/25/2023: BUN 15; Creatinine, Ser 1.05; Hemoglobin 15.0; Platelets 163; Potassium 3.6; Sodium 141     Component Value Date/Time   CHOL 148 02/07/2023 1113   TRIG 90 02/07/2023 1113   HDL 50 02/07/2023 1113   CHOLHDL 3.0 02/07/2023 1113   LDLCALC 81 02/07/2023 1113     Assessment and Plan:   Noncardiac chest pain: Cardiac workup including CT cardiac, echocardiogram and 2-week event monitor are unremarkable.  Continues to have chest pains, mostly at night and sometimes while driving, feels like a band of chest pressure across his whole chest, especially the lower part of his chest.  Associated with SOB.  Noncardiac.  Obtain ESR, CRP, D-dimer and VQ scan.  Has PTSD and GERD, follow up with PCP.  HTN, controlled: Continue amlodipine  10 mg once daily.  Follow with PCP.   OSA on CPAP: He uses CPAP for the first few minutes before going to sleep and then takes off the CPAP.  He does have palpitations which I encouraged him to wear CPAP regularly.  Will refer him to sleep medicine, pulm to establish care.  Nicotine abuse: Smokes 10 cigarettes/day.  Counseling  provided.   Medication Adjustments/Labs and Tests Ordered: Current medicines are reviewed at length with the patient today.  Concerns regarding medicines are outlined above.   Tests Ordered: No orders of the defined types were placed in this encounter.   Medication Changes: No orders of the defined types were placed in this encounter.   Disposition:  Follow up PRN  Signed, Shahzaib Azevedo Beauford Bounds, MD, 12/11/2023 10:06 AM    [facility] at [facility] [address], [place], [place] [address]

## 2023-12-11 NOTE — Patient Instructions (Addendum)
 Medication Instructions:  Your physician recommends that you continue on your current medications as directed. Please refer to the Current Medication list given to you today.  *If you need a refill on your cardiac medications before your next appointment, please call your pharmacy*  Lab Work: ESR CRP D-Dimer  If you have labs (blood work) drawn today and your tests are completely normal, you will receive your results only by: MyChart Message (if you have MyChart) OR A paper copy in the mail If you have any lab test that is abnormal or we need to change your treatment, we will call you to review the results.  Testing/Procedures: Ventilation-Perfusion Scan: What to Expect  A ventilation-perfusion scan is a procedure to look at air flow and blood flow in your lungs. It's often done to check if you have a blood clot in your lungs. During the scan, you're given radioactive compounds. They're not harmful. They're given at very low doses and stay in your body for a short time. A special camera is then used to scan your lungs. Tell a health care provider about: Any allergies you have. All medicines you take. These include vitamins, herbs, eye drops, and creams. Any bleeding problems you have. Any surgeries you've had. Any medical conditions you have. Whether you're pregnant or may be pregnant. Whether you're breastfeeding. What are the risks? Your health care provider will talk with you about risks, such as an allergic reaction to the compounds. What happens before the procedure? Do not smoke, vape, or use nicotine or tobacco before the procedure. Ask about changing or stopping: Any medicines you take. Any vitamins, herbs, or supplements you take. What happens during the procedure? An IV will be put into a vein in your hand or arm. The IV will stay in place until the scan is done. A small amount of the radioactive compound will be put into your bloodstream with a needle. Your lungs  will be scanned. The camera will take pictures of your lungs. You will be asked to breathe in a second compound. Take deep breaths as told. Your lungs will be scanned again. Your IV will be taken out. These steps may vary. Ask what you can expect. What can I expect after the procedure? You may go home unless told not to. Go back to your normal activities and diet as told. Ask when your procedure results will be ready and how to get them. You may need to call or meet with your provider to get your results. This information is not intended to replace advice given to you by your health care provider. Make sure you discuss any questions you have with your health care provider. Document Revised: 01/15/2023 Document Reviewed: 01/15/2023 Elsevier Patient Education  2024 Elsevier Inc.  Follow-Up: At [facility], you and your health needs are our priority.  As part of our continuing mission to provide you with exceptional heart care, our providers are all part of one team.  This team includes your primary Cardiologist (physician) and Advanced Practice Providers or APPs (Physician Assistants and Nurse Practitioners) who all work together to provide you with the care you need, when you need it.  Your next appointment:    Follow up as needed.   Provider:   You may see Vishnu P Mallipeddi, MD or one of the following Advanced Practice Providers on your designated Care Team:   [place], PA-C  [place], PA-C Theotis Flake, [place]     We recommend signing up  for the patient portal called "MyChart".  Sign up information is provided on this After Visit Summary.  MyChart is used to connect with patients for Virtual Visits (Telemedicine).  Patients are able to view lab/test results, encounter notes, upcoming appointments, etc.  Non-urgent messages can be sent to your provider as well.   To learn more about what you can do with MyChart, go to [email or web address].   Other  Instructions

## 2023-12-16 ENCOUNTER — Ambulatory Visit (HOSPITAL_COMMUNITY)
Admission: RE | Admit: 2023-12-16 | Discharge: 2023-12-16 | Disposition: A | Discharge status: Home / Self Care | Source: Ambulatory Visit | Attending: Internal Medicine | Admitting: Internal Medicine

## 2023-12-16 ENCOUNTER — Encounter (HOSPITAL_COMMUNITY)
Admission: RE | Admit: 2023-12-16 | Discharge: 2023-12-16 | Disposition: A | Discharge status: Home / Self Care | Source: Ambulatory Visit | Attending: Internal Medicine | Admitting: Internal Medicine

## 2023-12-16 DIAGNOSIS — R079 Chest pain, unspecified: Secondary | ICD-10-CM | POA: Insufficient documentation

## 2023-12-16 MED ORDER — TECHNETIUM TO 99M ALBUMIN AGGREGATED
3.9000 | Freq: Once | INTRAVENOUS | Status: AC | PRN
  Administered 2023-12-16: 3.9 via INTRAVENOUS

## 2023-12-17 ENCOUNTER — Ambulatory Visit: Payer: Self-pay | Admitting: Internal Medicine

## 2023-12-19 ENCOUNTER — Ambulatory Visit (INDEPENDENT_AMBULATORY_CARE_PROVIDER_SITE_OTHER): Payer: Self-pay | Admitting: Adult Health

## 2023-12-19 ENCOUNTER — Encounter: Payer: Self-pay | Admitting: Adult Health

## 2023-12-19 VITALS — BP 142/84 | HR 61 | Ht 70.0 in | Wt 218.0 lb

## 2023-12-19 DIAGNOSIS — F17210 Nicotine dependence, cigarettes, uncomplicated: Secondary | ICD-10-CM | POA: Diagnosis not present

## 2023-12-19 DIAGNOSIS — G4733 Obstructive sleep apnea (adult) (pediatric): Secondary | ICD-10-CM

## 2023-12-19 NOTE — Patient Instructions (Signed)
 Set up for CPAP titration study.  Try to wear CPAP At bedtime  all night long  Call back/send name of DME company so we can send order for supplies and SD card.  Work on healthy weight loss  Do not drive if sleepy  Saline nasal spray and gel At bedtime   Follow up in 6-8 weeks to discuss results.
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
## 2023-12-19 NOTE — Assessment & Plan Note (Signed)
 Severe obstructive sleep apnea with CPAP intolerance.  Patient education given on sleep apnea and CPAP care.  Patient is going to contact us  back with his CPAP DME information.  Order for an SD card will be sent along with order for new supplies.  I have encouraged him to wear CPAP each night all night long.  Add in saline nasal spray and gel as needed for comfort.  Set up for CPAP titration study.  Plan  Patient Instructions  Set up for CPAP titration study.  Try to wear CPAP At bedtime  all night long  Call back/send name of DME company so we can send order for supplies and SD card.  Work on healthy weight loss  Do not drive if sleepy  Saline nasal spray and gel At bedtime   Follow up in 6-8 weeks to discuss results.

## 2024-01-06 ENCOUNTER — Telehealth: Admitting: Family Medicine

## 2024-01-06 DIAGNOSIS — M5412 Radiculopathy, cervical region: Secondary | ICD-10-CM

## 2024-01-06 NOTE — Progress Notes (Signed)
 [place]   Has spinal changes that appear to either be worsening, flared up or pinching a nerve. Needs in person eval and treatment.  Patient acknowledged agreement and understanding of the plan.

## 2024-03-09 ENCOUNTER — Emergency Department (HOSPITAL_COMMUNITY): Admission: EM | Admit: 2024-03-09 | Discharge: 2024-03-09 | Disposition: A | Discharge status: Home / Self Care

## 2024-03-09 ENCOUNTER — Encounter (HOSPITAL_COMMUNITY): Payer: Self-pay

## 2024-03-09 ENCOUNTER — Other Ambulatory Visit: Payer: Self-pay

## 2024-03-09 DIAGNOSIS — M5481 Occipital neuralgia: Secondary | ICD-10-CM | POA: Insufficient documentation

## 2024-03-09 DIAGNOSIS — Z79899 Other long term (current) drug therapy: Secondary | ICD-10-CM | POA: Insufficient documentation

## 2024-03-09 DIAGNOSIS — R519 Headache, unspecified: Secondary | ICD-10-CM | POA: Diagnosis present

## 2024-03-09 NOTE — Discharge Instructions (Addendum)
 Do think this is likely occipital neuralgia.   You can take Excedrin for the pain.  This can be purchased over-the-counter.  I do think you should follow-up with neurology for your ongoing headaches has been going on for the past 2 years.     You ever have any kind of numbness or tingling in your arms or legs, lower part of your face, vision changes then please come back to the ED for further evaluation.  Right now, have no concerns for stroke.

## 2024-03-09 NOTE — ED Triage Notes (Signed)
 Pt c/o  left sided head numbness x 3 days. States he feels like left side of temple is swollen. No blood thinners. Pt is A&O x4.

## 2024-03-09 NOTE — ED Provider Notes (Signed)
 [place] EMERGENCY DEPARTMENT AT [facility] Provider Note   CSN: [id number] Arrival date & time: 03/09/24  8060     Patient presents with: No chief complaint on file.   Gerald Phillips is a 43 y.o. male.   HPI ' Presents with episodes of headache.  Left-sided headaches.  Sharp shooting pain that starts in the back of his head completely on the left side and subsequently goes more towards his temple area.  Does not radiate across the midline.  Patient states that this would go off and over the past 2 years.  Little bit worse over the past couple days.  No numbness or tingling anywhere other than the scalp area in this location.  No vision changes.  No diplopia.  No numbness or tingling in his extremities.  No difficulty walking.  Sometimes take Tylenol  for the pain.  The last, denies all complaints including chest pain, shortness of breath nausea vomit diarrhea.     Prior to Admission medications   Medication Sig Start Date End Date Taking? Authorizing Provider  amLODipine  (NORVASC ) 10 MG tablet Take 1 tablet (10 mg total) by mouth daily. 09/10/23   Del Wilhelmena Lloyd Sola, FNP  baclofen  (LIORESAL ) 10 MG tablet Take 1 tablet (10 mg total) by mouth at bedtime. 12/04/22   Zarwolo, Gloria, FNP  Blood Pressure KIT Use once daily 09/10/23   Del Orbe Polanco, Iliana, FNP  cyanocobalamin  (VITAMIN B12) 1000 MCG tablet Take 1,000 mcg by mouth daily.    [provider]  cyclobenzaprine  (FLEXERIL ) 10 MG tablet Take 1 tablet (10 mg total) by mouth 3 (three) times daily as needed for muscle spasms. 08/21/22   Blair, Diane W, FNP  HYDROcodone -acetaminophen  (NORCO/VICODIN) 5-325 MG tablet Take 1 tablet by mouth every 4 (four) hours as needed. 11/26/23   Theadore Ozell HERO, MD  KRILL OIL PO Take by mouth.    [provider]  LORazepam  (ATIVAN ) 1 MG tablet Take 1 tablet (1 mg total) by mouth daily. 09/03/22   Bernis Ernst, PA-C  Magnesium 400 MG CAPS Take 1 capsule by mouth daily.     [provider]  naproxen  (NAPROSYN ) 500 MG tablet Take 1 tablet (500 mg total) by mouth 2 (two) times daily. 11/26/23   Theadore Ozell HERO, MD  nitroGLYCERIN  (NITROSTAT ) 0.4 MG SL tablet Place 1 tablet (0.4 mg total) under the tongue every 5 (five) minutes as needed for chest pain. 10/16/22   Del Wilhelmena Lloyd Sola, FNP  nortriptyline  (PAMELOR ) 10 MG capsule Take 2 capsules (20 mg total) by mouth at bedtime. Take 1 capsule (10 mg) at bedtime for 1 week, then take 2 capsules (20 mg) thereafter 03/28/23   Leigh Venetia CROME, MD  OVER THE COUNTER MEDICATION Vitamin D3 with K2    [provider]  OVER THE COUNTER MEDICATION Take 1 tablet by mouth daily. Vitamin b6,folic acid ,vitamin d  and vitamin b12    [provider]  OVER THE COUNTER MEDICATION Take 1 tablet by mouth daily. Scription BP    [provider]    Allergies: Atarax  [hydroxyzine ], Penicillins, and Penicillins    Review of Systems  Updated Vital Signs BP (!) 140/96 (BP Location: Right Arm)   Pulse 80   Temp 98.9 F (37.2 C) (Oral)   Resp 17   SpO2 100%   Physical Exam  (all labs ordered are listed, but only abnormal results are displayed) Labs Reviewed - No data to display  EKG: None  Radiology: No  results found.   Procedures   Medications Ordered in the ED - No data to display                                  Medical Decision Making  Presents with episodes of headache.  Left-sided headaches.  Sharp shooting pain that starts in the back of his head completely on the left side and subsequently goes more towards his temple area.  Does not radiate across the midline.  Patient states that this would go off and over the past 2 years.  Little bit worse over the past couple days.  No numbness or tingling anywhere other than the scalp area in this location.  No vision changes.  No diplopia.  No numbness or tingling in his extremities.  No difficulty walking.  Sometimes take Tylenol  for the pain.   The last, denies all complaints including chest pain, shortness of breath nausea vomit diarrhea.   Upon exam, patient hemodynamic stable.  ANO x 3 GCS 15.  Cranials 2 through 12 intact.  No focal deficits of the face.  No facial droop.  Dermatomes intact.  Strength and sensation intact in bilateral upper and lower extremities.  Normal finger-nose.  NIH is 0.  Patient does have some pain to palpation along the left scalp area as well as the posterior aspect of his left scalp.  All consistent for occipital neuralgia.  No concerns for space-occupying lesion.  No concerns for CVA.  Patient is otherwise neurologically intact.  Recommended follow-up outpatient.  No concerns for giant cell temporal arteritis.      Final diagnoses:  Occipital neuralgia of left side    ED Discharge Orders     None          Simon Lavonia SAILOR, MD 03/09/24 2306

## 2024-03-15 DIAGNOSIS — J3489 Other specified disorders of nose and nasal sinuses: Secondary | ICD-10-CM | POA: Diagnosis not present

## 2024-03-15 DIAGNOSIS — H6122 Impacted cerumen, left ear: Secondary | ICD-10-CM | POA: Diagnosis not present

## 2024-03-18 ENCOUNTER — Ambulatory Visit (HOSPITAL_BASED_OUTPATIENT_CLINIC_OR_DEPARTMENT_OTHER): Attending: Adult Health | Admitting: Pulmonary Disease

## 2024-03-18 ENCOUNTER — Encounter (INDEPENDENT_AMBULATORY_CARE_PROVIDER_SITE_OTHER): Payer: Self-pay

## 2024-03-18 DIAGNOSIS — G4733 Obstructive sleep apnea (adult) (pediatric): Secondary | ICD-10-CM

## 2024-03-20 NOTE — Procedures (Signed)
 [facility] [address] [place], [place] [address] Tel: [phone]   Fax: [phone]  Titration Interpretation  Patient Name:  Gerald Phillips, Gerald Phillips Study Date:  03/18/2024 Referring Physician:  MADELIN PARRETT [phone]) %%startinterp%% Indications for Polysomnography The patient is a 43 year-old Male who is 5' 10 and weighs 224.0 lbs. His BMI equals 32.4.  A full night titration treatment study was performed.   Polysomnogram Data A full night polysomnogram recorded the standard physiologic parameters including EEG, EOG, EMG, EKG, nasal and oral airflow.  Respiratory parameters of chest and abdominal movements were recorded with Respiratory Inductance Plethysmography belts.  Oxygen saturation was recorded by pulse oximetry.   Sleep Architecture The total recording time of the polysomnogram was 367.1 minutes.  The total sleep time was 310.0 minutes.  The patient spent 1.8% of total sleep time in Stage N1, 78.4% in Stage N2, 4.0% in Stages N3, and 15.8% in REM.  Sleep latency was 27.4 minutes.  REM latency was 57.5 minutes.  Sleep Efficiency was 84.4%.  Wake after Sleep Onset time was 29.5 minutes.  Titration Summary The patient was titrated at pressures ranging from 5* cm/H20 up to 16* cm/H20 .The last pressure used in the study was 16* cm/H20 without supplemental oxygen.  Respiratory Events The polysomnogram revealed a presence of 2 obstructive, 3 central, and - mixed apneas resulting in an Apnea index of 1.0 events per hour.  There were 12 hypopneas (>=3% desaturation and/or arousal) resulting in an Apnea\Hypopnea Index (AHI >=3% desaturation and/or arousal) of 3.3 events per hour.  There were 3 hypopneas (>=4% desaturation) resulting in an Apnea\Hypopnea Index (AHI >=4% desaturation) of 1.5 events per hour.  There were - Respiratory Effort Related Arousals resulting in a RERA index of - events per hour. The Respiratory Disturbance Index is 3.3 events per hour.   The snore index was 252.4 events per hour.  Mean oxygen saturation was 94.5%.  The lowest oxygen saturation during sleep was 89.0%.  Time spent <=88% oxygen saturation was 0.1 minutes (-).  Limb Activity There were 17 limb movements recorded.  Of this total, 4 were classified as PLMs.  Of the PLMs, - were associated with arousals.  The Limb Movement index was 3.3 per hour while the PLM index was 0.8 per hour.  Cardiac Summary The average pulse rate was 59.8 bpm.  The minimum pulse rate was 51.0 bpm while the maximum pulse rate was 94.0 bpm.  Cardiac rhythm was normal/abnormal.  Comments: Previous study in April 2021, home sleep study showed severe sleep apnea with AHI 30.2/hour and SpO2 low at 62%. CPAP was titrated to 16 cm with medium full face mask. Events were corrected at 9 cm   Diagnosis: Severe OSA corrected by CPAP  Recommendations: Trial of autoCPAP 9-16 cm with medium full face mask Follow clinically & adjust settings as needed based on download data EPR setting of 2 can be used   This study was personally reviewed and electronically signed by: HARDEN STAFF MD Accredited Board Certified in Sleep Medicine

## 2024-03-25 ENCOUNTER — Other Ambulatory Visit: Payer: Self-pay | Admitting: *Deleted

## 2024-03-25 ENCOUNTER — Encounter: Payer: Self-pay | Admitting: *Deleted

## 2024-03-25 ENCOUNTER — Ambulatory Visit: Payer: Self-pay | Admitting: Adult Health

## 2024-03-25 ENCOUNTER — Other Ambulatory Visit: Payer: Self-pay

## 2024-03-25 DIAGNOSIS — G4733 Obstructive sleep apnea (adult) (pediatric): Secondary | ICD-10-CM

## 2024-03-25 NOTE — Telephone Encounter (Signed)
 Pt needs 2 month f/u with Tammy

## 2024-03-25 NOTE — Progress Notes (Signed)
 Mychart message sent to patient.

## 2024-03-25 NOTE — Telephone Encounter (Signed)
 Atc x1 mailbox full

## 2024-04-20 NOTE — Telephone Encounter (Signed)
 Per Mitch at [employer], RT saying that they have not been able to make contact with this patient to adjust the pressure

## 2024-05-01 ENCOUNTER — Emergency Department (HOSPITAL_BASED_OUTPATIENT_CLINIC_OR_DEPARTMENT_OTHER)

## 2024-05-01 ENCOUNTER — Emergency Department (HOSPITAL_BASED_OUTPATIENT_CLINIC_OR_DEPARTMENT_OTHER)
Admission: EM | Admit: 2024-05-01 | Discharge: 2024-05-01 | Disposition: A | Discharge status: Home / Self Care | Attending: Emergency Medicine | Admitting: Emergency Medicine

## 2024-05-01 ENCOUNTER — Encounter (HOSPITAL_BASED_OUTPATIENT_CLINIC_OR_DEPARTMENT_OTHER): Payer: Self-pay

## 2024-05-01 ENCOUNTER — Other Ambulatory Visit: Payer: Self-pay

## 2024-05-01 DIAGNOSIS — I10 Essential (primary) hypertension: Secondary | ICD-10-CM | POA: Insufficient documentation

## 2024-05-01 DIAGNOSIS — R519 Headache, unspecified: Secondary | ICD-10-CM | POA: Insufficient documentation

## 2024-05-01 DIAGNOSIS — M542 Cervicalgia: Secondary | ICD-10-CM | POA: Diagnosis not present

## 2024-05-01 DIAGNOSIS — Z79899 Other long term (current) drug therapy: Secondary | ICD-10-CM | POA: Diagnosis not present

## 2024-05-01 LAB — BASIC METABOLIC PANEL WITH GFR
Anion gap: 7 (ref 5–15)
BUN: 7 mg/dL (ref 6–20)
CO2: 30 mmol/L (ref 22–32)
Calcium: 9.7 mg/dL (ref 8.9–10.3)
Chloride: 102 mmol/L (ref 98–111)
Creatinine, Ser: 1.25 mg/dL — ABNORMAL HIGH (ref 0.61–1.24)
GFR, Estimated: >60 mL/min (ref >60)
Glucose, Bld: 99 mg/dL (ref 70–99)
Potassium: 4.1 mmol/L (ref 3.5–5.1)
Sodium: 138 mmol/L (ref 135–145)

## 2024-05-01 LAB — CBC
HCT: 44.7 % (ref 39.0–52.0)
Hemoglobin: 15.6 g/dL (ref 13.0–17.0)
MCH: 33.5 pg (ref 26.0–34.0)
MCHC: 34.9 g/dL (ref 30.0–36.0)
MCV: 96.1 fL (ref 80.0–100.0)
Platelets: 137 K/uL — ABNORMAL LOW (ref 150–400)
RBC: 4.65 MIL/uL (ref 4.22–5.81)
RDW: 12.6 % (ref 11.5–15.5)
WBC: 4.6 K/uL (ref 4.0–10.5)
nRBC: 0 % (ref 0.0–0.2)

## 2024-05-01 MED ORDER — DIPHENHYDRAMINE HCL 50 MG/ML IJ SOLN
12.5000 mg | Freq: Once | INTRAMUSCULAR | Status: AC
  Administered 2024-05-01: 12.5 mg via INTRAVENOUS
  Filled 2024-05-01: qty 1

## 2024-05-01 MED ORDER — KETOROLAC TROMETHAMINE 30 MG/ML IJ SOLN: 30.0000 mg | Freq: Once | INTRAMUSCULAR | Status: DC

## 2024-05-01 MED ORDER — SODIUM CHLORIDE 0.9 % IV BOLUS
1000.0000 mL | Freq: Once | INTRAVENOUS | Status: AC
  Administered 2024-05-01: 1000 mL via INTRAVENOUS

## 2024-05-01 MED ORDER — DIPHENHYDRAMINE HCL 50 MG/ML IJ SOLN: 25.0000 mg | Freq: Once | INTRAMUSCULAR | Status: DC

## 2024-05-01 MED ORDER — METOCLOPRAMIDE HCL 5 MG/ML IJ SOLN
10.0000 mg | Freq: Once | INTRAMUSCULAR | Status: AC
  Administered 2024-05-01: 10 mg via INTRAVENOUS
  Filled 2024-05-01: qty 2

## 2024-05-01 MED ORDER — KETOROLAC TROMETHAMINE 15 MG/ML IJ SOLN: 15.0000 mg | Freq: Once | INTRAMUSCULAR | Status: DC

## 2024-05-01 NOTE — ED Notes (Addendum)
 Pt had anxiety and restlessness due to medication Reglan  being given. Pt requested IV be removed or he was  I will rip it out myself. IV was removed. Pt was unable to sit and had to keep moving. Pt stated he needed some air and is walking in the lobby. Pt came back to his room and stated he is alright and feeling a little better. Provider is made aware and at bedside with Pt.

## 2024-05-01 NOTE — ED Notes (Signed)
 Spoke to Pt about IM benadryl  and Toradol  Pt refused both at this time. Provider was made aware of same.

## 2024-05-01 NOTE — ED Triage Notes (Signed)
 Patient arrives POV with complaints of persistent headache x4 days.  Patient is also reports that headache pain radiating up his shoulder to his neck.

## 2024-05-01 NOTE — ED Provider Notes (Signed)
 [place] EMERGENCY DEPARTMENT AT [facility] Provider Note   CSN: [id number] Arrival date & time: 05/01/24  1443     Patient presents with: Headache  HPI Gerald Phillips is a 43 y.o. male presenting for headache.  Started 4 days ago.  Pain is primarily localized in the left side of his head and radiates down the left neck.  He states that the neck pain can be irritating at times and he has been using a neck roller.  He denies nuchal rigidity denies fever.  Denies any vision changes.  States he has never had a headache like this before.  Denies fever.  Denies any weakness or numbness in his extremities.  Reports he had a similar headache and evaluation at the beginning of September.  Denies pain about the temporal region, jaw claudication.  Past Medical History:  Diagnosis Date   Anxiety    GERD (gastroesophageal reflux disease)    Gout    Hypertension        Headache      Prior to Admission medications   Medication Sig Start Date End Date Taking? Authorizing Provider  amLODipine  (NORVASC ) 10 MG tablet Take 1 tablet (10 mg total) by mouth daily. 09/10/23   Del Wilhelmena Lloyd Sola, FNP  baclofen  (LIORESAL ) 10 MG tablet Take 1 tablet (10 mg total) by mouth at bedtime. 12/04/22   Edman Meade PEDLAR, FNP  Blood Pressure KIT Use once daily 09/10/23   Del Wilhelmena Lloyd, Dixon, FNP  cyanocobalamin  (VITAMIN B12) 1000 MCG tablet Take 1,000 mcg by mouth daily.    [provider]  cyclobenzaprine  (FLEXERIL ) 10 MG tablet Take 1 tablet (10 mg total) by mouth 3 (three) times daily as needed for muscle spasms. 08/21/22   Blair, Diane W, FNP  HYDROcodone -acetaminophen  (NORCO/VICODIN) 5-325 MG tablet Take 1 tablet by mouth every 4 (four) hours as needed. 11/26/23   Theadore Ozell HERO, MD  KRILL OIL PO Take by mouth.    [provider]  LORazepam  (ATIVAN ) 1 MG tablet Take 1 tablet (1 mg total) by mouth daily. 09/03/22   Bernis Ernst, PA-C  Magnesium 400 MG CAPS Take 1 capsule  by mouth daily.    [provider]  naproxen  (NAPROSYN ) 500 MG tablet Take 1 tablet (500 mg total) by mouth 2 (two) times daily. 11/26/23   Theadore Ozell HERO, MD  nitroGLYCERIN  (NITROSTAT ) 0.4 MG SL tablet Place 1 tablet (0.4 mg total) under the tongue every 5 (five) minutes as needed for chest pain. 10/16/22   Del Wilhelmena Lloyd Sola, FNP  nortriptyline  (PAMELOR ) 10 MG capsule Take 2 capsules (20 mg total) by mouth at bedtime. Take 1 capsule (10 mg) at bedtime for 1 week, then take 2 capsules (20 mg) thereafter 03/28/23   Leigh Venetia CROME, MD  OVER THE COUNTER MEDICATION Vitamin D3 with K2    [provider]  OVER THE COUNTER MEDICATION Take 1 tablet by mouth daily. Vitamin b6,folic acid ,vitamin d  and vitamin b12    [provider]  OVER THE COUNTER MEDICATION Take 1 tablet by mouth daily. Scription BP    [provider]    Allergies: Atarax  [hydroxyzine ], Penicillins, Reglan  [metoclopramide ], and Penicillins    Review of Systems  Neurological:  Positive for headaches.    Updated Vital Signs BP (!) 140/98 (BP Location: Right Arm)   Pulse 81   Temp 98.1 F (36.7 C) (Oral)   Resp 20   Ht 5' 10 (1.778 m)   Wt 101.6  kg   SpO2 100%   BMI 32.14 kg/m   Physical Exam Vitals and nursing note reviewed.  HENT:     Head: Normocephalic and atraumatic.     Mouth/Throat:     Mouth: Mucous membranes are moist.  Eyes:     General:        Right eye: No discharge.        Left eye: No discharge.     Conjunctiva/sclera: Conjunctivae normal.  Cardiovascular:     Rate and Rhythm: Normal rate and regular rhythm.     Pulses: Normal pulses.     Heart sounds: Normal heart sounds.  Pulmonary:     Effort: Pulmonary effort is normal.     Breath sounds: Normal breath sounds.  Abdominal:     General: Abdomen is flat.     Palpations: Abdomen is soft.  Skin:    General: Skin is warm and dry.  Neurological:     General: No focal deficit present.     Comments: GCS 15.  Speech is goal oriented. No deficits appreciated to CN III-XII; symmetric eyebrow raise, no facial drooping, tongue midline. Patient has equal grip strength bilaterally with 5/5 strength against resistance in all major muscle groups bilaterally. Sensation to light touch intact. Patient moves extremities without ataxia. Normal finger-nose-finger. Patient ambulatory with steady gait.  Psychiatric:        Mood and Affect: Mood normal.     (all labs ordered are listed, but only abnormal results are displayed) Labs Reviewed  BASIC METABOLIC PANEL WITH GFR - Abnormal; Notable for the following components:      Result Value   Creatinine, Ser 1.25 (*)    All other components within normal limits  CBC - Abnormal; Notable for the following components:   Platelets 137 (*)    All other components within normal limits    EKG: None  Radiology: CT Head Wo Contrast Result Date: 05/01/2024 EXAM: CT HEAD WITHOUT CONTRAST 05/01/2024 03:47:17 PM TECHNIQUE: CT of the head was performed without the administration of intravenous contrast. Automated exposure control, iterative reconstruction, and/or weight based adjustment of the mA/kV was utilized to reduce the radiation dose to as low as reasonably achievable. COMPARISON: CT head without contrast 22/24 assess report. CLINICAL HISTORY: Headache, neuro deficit. FINDINGS: BRAIN AND VENTRICLES: No acute hemorrhage. No evidence of acute infarct. No hydrocephalus. No extra-axial collection. No mass effect or midline shift. ORBITS: No acute abnormality. SINUSES: No acute abnormality. SOFT TISSUES AND SKULL: No acute soft tissue abnormality. No skull fracture. IMPRESSION: 1. No acute intracranial abnormality. Electronically signed by: Lonni Necessary MD 05/01/2024 03:55 PM EDT RP Workstation: [id number]     Procedures   Medications Ordered in the ED  ketorolac  (TORADOL ) 30 MG/ML injection 30 mg (30 mg Intramuscular Not Given 05/01/24 1650)  sodium chloride  0.9  % bolus 1,000 mL ( Intravenous Stopped 05/01/24 1628)  metoCLOPramide  (REGLAN ) injection 10 mg (10 mg Intravenous Given 05/01/24 1611)  diphenhydrAMINE  (BENADRYL ) injection 12.5 mg (12.5 mg Intravenous Given 05/01/24 1609)                                    Medical Decision Making Amount and/or Complexity of Data Reviewed Labs: ordered. Radiology: ordered.  Risk Prescription drug management.   Initial Impression and Ddx 43 year old well-appearing male present for headache.  Exam was unremarkable.  DDx includes SAH, meningitis, temporal arteritis, carotid artery dissection, other. Patient PMH  that increases complexity of ED encounter:  HTN  Interpretation of Diagnostics - I independent reviewed and interpreted the labs as followed: Creatinine 1.25 but GFR is normal  - I independently visualized the following imaging with scope of interpretation limited to determining acute life threatening conditions related to emergency care: CT head, which revealed no acute abnormality  Patient Reassessment and Ultimate Disposition/Management On reassessment, headache had resolved.  Did appear to be somewhat anxious.  Had requested to walk outside to get some air.  Wondering if he may have had a mild adverse reaction to the Reglan .  Placed him observation for another hour and symptoms improved.  Per chart review, had a similar headache and evaluation at the beginning of September with reassuring workup.  Advised him to follow-up with neurology.  Discussed return precautions.  Discharge.  Patient management required discussion with the following services or consulting groups:  None  Complexity of Problems Addressed Acute complicated illness or Injury  Additional Data Reviewed and Analyzed Further history obtained from: Further history from spouse/family member, Past medical history and medications listed in the EMR, and Prior ED visit notes  Patient Encounter Risk Assessment Consideration of  hospitalization      Final diagnoses:  Nonintractable headache, unspecified chronicity pattern, unspecified headache type    ED Discharge Orders          Ordered    Ambulatory referral to Neurology       Comments: An appointment is requested in approximately: 2 weeks   05/01/24 1709               Lang Norleen POUR, PA-C 05/01/24 1710    Bernard Drivers, MD 05/03/24 [phone]

## 2024-05-01 NOTE — Discharge Instructions (Addendum)
 Evaluation for headache was overall reassuring.  You can take Tylenol  and ibuprofen  at home for headache relief also make sure that you are hydrated.  Please follow-up with neurology.  Please return if your symptoms worsen or change to the ED for further evaluation.
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
## 2024-05-10 ENCOUNTER — Encounter: Payer: Self-pay | Admitting: Radiology

## 2024-05-13 ENCOUNTER — Ambulatory Visit: Admitting: Neurology

## 2024-05-13 ENCOUNTER — Encounter: Payer: Self-pay | Admitting: Neurology

## 2024-05-21 ENCOUNTER — Encounter: Payer: Self-pay | Admitting: *Deleted

## 2024-05-21 NOTE — Progress Notes (Signed)
 ATC x2.  No answer.  VM full. Mychart message sent for a 2nd time.

## 2024-05-28 ENCOUNTER — Ambulatory Visit: Payer: Self-pay

## 2024-05-28 NOTE — Telephone Encounter (Signed)
 FYI Only or Action Required?: FYI only for provider: ED advised. REFUSED  Patient was last seen in primary care on 01/06/2024 by Moishe Chiquita HERO, NP.  Called Nurse Triage reporting Blurred Vision, Dizziness, and Extremity Weakness.  Symptoms began several days ago.  Interventions attempted: Rest, hydration, or home remedies.  Symptoms are: gradually worsening.  Triage Disposition: Call EMS 911 Now  Patient/caregiver understands and will follow disposition?: No, wishes to speak with PCP  Copied from CRM #[id number]. Topic: Clinical - Red Word Triage >> May 28, 2024  2:03 PM Willma R wrote: Red Word that prompted transfer to Nurse Triage: Patient is having blurred vision, dizziness, weakness in his legs for the last 4-5 days. Sometimes when he is sitting and turns his head quickly feels like he may pass out. Reason for Disposition  [1] Weakness (i.e., paralysis, loss of muscle strength) of the face, arm / hand, or leg / foot on one side of the body AND [2] sudden onset AND [3] present now  (Exception: Bell's palsy suspected: weakness on one side of the face developing over hours to days, with no other symptoms.)  Answer Assessment - Initial Assessment Questions Neck and low back pain - left leg weak - low back pain and concerned that its sciatica- no pain to LLE, just weakness. He can walk.  Left temporal head pain causing dizziness for 2 years. Now with blurred vision and feels like he is going in and out, hard to focus.  When he Turns head to the side, eyes have to catch up to his head. He was driving earlier and had to pull over to have his wife drive he was so dizzy.  BP 130/90 at highest at home- no missed meds Advised patient strongly to go to the ED- offered to call EMS to bring him. Concern for stroke, temporal arteritis, or cord impingement. Patient unwilling to go to the ED at this time. Though he has new symptoms, feels like it is all related to his previous issues. [facility] notified of  refusal. Appt made for Monday and advised may still be sent to the ED from Appt based off symptoms.  He understands.   1. SYMPTOM: What is the main symptom you are concerned about? (e.g., weakness, numbness)     Blurred vision, dizziness 2. ONSET: When did this start? (e.g., minutes, hours, days; while sleeping)     4-5 days  3. LAST NORMAL: When was the last time you (the patient) were normal (no symptoms)?     4-5 days 4. PATTERN Does this come and go, or has it been constant since it started?  Is it present now?     Constant  5. CARDIAC SYMPTOMS: Have you had any of the following symptoms: chest pain, difficulty breathing, palpitations?     denies 6. NEUROLOGIC SYMPTOMS: Have you had any of the following symptoms: headache, dizziness, vision loss, double vision, changes in speech, unsteady on your feet?     Blurred vision, dizzy, left leg weakness 7. OTHER SYMPTOMS: Do you have any other symptoms?     Left temporal headache  Protocols used: Neurologic Deficit-A-AH

## 2024-05-31 ENCOUNTER — Ambulatory Visit: Payer: Self-pay | Admitting: Nurse Practitioner

## 2024-05-31 NOTE — Telephone Encounter (Signed)
 Patient scheduled.

## 2024-06-02 ENCOUNTER — Emergency Department (HOSPITAL_COMMUNITY)
Admission: EM | Admit: 2024-06-02 | Discharge: 2024-06-02 | Disposition: A | Discharge status: Home / Self Care | Attending: Emergency Medicine | Admitting: Emergency Medicine

## 2024-06-02 ENCOUNTER — Emergency Department (HOSPITAL_COMMUNITY)

## 2024-06-02 ENCOUNTER — Encounter (HOSPITAL_COMMUNITY): Payer: Self-pay

## 2024-06-02 ENCOUNTER — Other Ambulatory Visit: Payer: Self-pay

## 2024-06-02 DIAGNOSIS — R519 Headache, unspecified: Secondary | ICD-10-CM | POA: Insufficient documentation

## 2024-06-02 DIAGNOSIS — I10 Essential (primary) hypertension: Secondary | ICD-10-CM | POA: Insufficient documentation

## 2024-06-02 DIAGNOSIS — R202 Paresthesia of skin: Secondary | ICD-10-CM | POA: Insufficient documentation

## 2024-06-02 DIAGNOSIS — Z79899 Other long term (current) drug therapy: Secondary | ICD-10-CM | POA: Diagnosis not present

## 2024-06-02 DIAGNOSIS — R079 Chest pain, unspecified: Secondary | ICD-10-CM | POA: Insufficient documentation

## 2024-06-02 LAB — CBC
HCT: 46 % (ref 39.0–52.0)
Hemoglobin: 15.7 g/dL (ref 13.0–17.0)
MCH: 32.4 pg (ref 26.0–34.0)
MCHC: 34.1 g/dL (ref 30.0–36.0)
MCV: 94.8 fL (ref 80.0–100.0)
Platelets: 215 K/uL (ref 150–400)
RBC: 4.85 MIL/uL (ref 4.22–5.81)
RDW: 12.7 % (ref 11.5–15.5)
WBC: 7 K/uL (ref 4.0–10.5)
nRBC: 0 % (ref 0.0–0.2)

## 2024-06-02 LAB — BASIC METABOLIC PANEL WITH GFR
Anion gap: 10 (ref 5–15)
BUN: 9 mg/dL (ref 6–20)
CO2: 27 mmol/L (ref 22–32)
Calcium: 9.5 mg/dL (ref 8.9–10.3)
Chloride: 102 mmol/L (ref 98–111)
Creatinine, Ser: 1.03 mg/dL (ref 0.61–1.24)
GFR, Estimated: >60 mL/min (ref >60)
Glucose, Bld: 81 mg/dL (ref 70–99)
Potassium: 4.1 mmol/L (ref 3.5–5.1)
Sodium: 139 mmol/L (ref 135–145)

## 2024-06-02 LAB — TROPONIN T, HIGH SENSITIVITY
Troponin T High Sensitivity: <15 ng/L (ref 0–19)
Troponin T High Sensitivity: <15 ng/L (ref 0–19)

## 2024-06-02 MED ORDER — IOHEXOL 350 MG/ML SOLN
75.0000 mL | Freq: Once | INTRAVENOUS | Status: AC | PRN
  Administered 2024-06-02: 75 mL via INTRAVENOUS

## 2024-06-02 MED ORDER — PROCHLORPERAZINE EDISYLATE 10 MG/2ML IJ SOLN
10.0000 mg | Freq: Once | INTRAMUSCULAR | Status: DC
  Filled 2024-06-02: qty 2

## 2024-06-02 MED ORDER — DIPHENHYDRAMINE HCL 50 MG/ML IJ SOLN
25.0000 mg | Freq: Once | INTRAMUSCULAR | Status: DC
Start: 2024-06-02 — End: 2024-06-02
  Filled 2024-06-02: qty 1

## 2024-06-02 MED ORDER — ACETAMINOPHEN 500 MG PO TABS
1000.0000 mg | ORAL_TABLET | Freq: Once | ORAL | Status: AC
Start: 2024-06-02 — End: 2024-06-02
  Administered 2024-06-02: 1000 mg via ORAL
  Filled 2024-06-02: qty 2

## 2024-06-02 MED ORDER — KETOROLAC TROMETHAMINE 15 MG/ML IJ SOLN
15.0000 mg | Freq: Once | INTRAMUSCULAR | Status: DC
  Filled 2024-06-02: qty 1

## 2024-06-02 MED ORDER — DOXYCYCLINE HYCLATE 100 MG PO TABS
100.0000 mg | ORAL_TABLET | Freq: Once | ORAL | Status: AC
  Administered 2024-06-02: 100 mg via ORAL
  Filled 2024-06-02: qty 1

## 2024-06-02 MED ORDER — DOXYCYCLINE HYCLATE 100 MG PO CAPS: 100.0000 mg | ORAL_CAPSULE | Freq: Two times a day (BID) | ORAL | 0 refills | Status: AC

## 2024-06-02 NOTE — ED Notes (Signed)
 Provider notified pt refused IV meds and stated he would rather take pills; pt informed about the IV medicine and what they were for and pt again stated he wanted pills over IV meds.
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
## 2024-06-02 NOTE — ED Triage Notes (Addendum)
 Pt reports the entire left side of his body from his head to his toes has been numb x 1 week week.  Pt also reports he keeps feeling like he might pass out.  Pt reports headache in the back of his head.

## 2024-06-02 NOTE — Discharge Instructions (Addendum)
 Today for headache with left-sided numbness that has been ongoing.  Fortunately your imaging today was reassuring, you had a normal neurologic exam.  Your symptoms and exam not consistent with stroke, your blood work was reassuring.  There is no sign of a right attack. Please return to the ER for new or worsening symptoms.  I referred you to cardiology for cardiac evaluation given the chest pain you had today, and to neurology for your continued headaches.  The tender area on your scalp has a small amount of infection, I am prescribing antibiotics.  Use warm compresses on this as well.

## 2024-06-02 NOTE — ED Notes (Signed)
 Pt states headache originating in left shoulder blade up through to the back of his head with dizziness. Visual fields intact w/ extraocular movements; states he has blurry vision at times.

## 2024-06-02 NOTE — ED Notes (Signed)
 PA-C at bedside
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
## 2024-07-22 ENCOUNTER — Ambulatory Visit: Admitting: Neurology

## 2024-07-22 ENCOUNTER — Encounter: Payer: Self-pay | Admitting: Neurology

## 2024-07-22 VITALS — BP 145/101 | HR 72 | Ht 70.0 in | Wt 223.0 lb

## 2024-07-22 DIAGNOSIS — M5412 Radiculopathy, cervical region: Secondary | ICD-10-CM

## 2024-07-22 DIAGNOSIS — G4486 Cervicogenic headache: Secondary | ICD-10-CM | POA: Diagnosis not present

## 2024-07-22 MED ORDER — GABAPENTIN 300 MG PO CAPS: 300.0000 mg | ORAL_CAPSULE | Freq: Every day | ORAL | 5 refills | Status: AC

## 2024-07-22 NOTE — Patient Instructions (Signed)
 I saw you today for neck pain and tightness that is radiating into your head.  I want you to start physical therapy as planned next week.  I am ordering an updated MRI of your neck as you may need to consider another opinion from spine surgery. If you do not hear from someone to schedule in 1-2 weeks, please let me know. After that MRI, we will decide if you need to see spine.  I am also starting gabapentin  300 mg at bedtime to help with your pain. This can cause sleepiness, so be careful when starting it.  I will be in touch when I have your results to discuss next steps.  The physicians and staff at [facility] are committed to providing excellent care. You may receive a survey requesting feedback about your experience at our office. We strive to receive very good responses to the survey questions. If you feel that your experience would prevent you from giving the office a very good  response, please contact our office to try to remedy the situation. We may be reached at [phone]. Thank you for taking the time out of your busy day to complete the survey.  Venetia Potters, MD [facility]

## 2024-07-22 NOTE — Progress Notes (Signed)
 Prior authorization submitted on [employer]. Pending determination. Tracking: [id number]

## 2024-07-23 NOTE — Progress Notes (Signed)
 Unable to reach patient. No return call/message from patient to schedule f/u.  Closing encounter per policy.
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
## 2024-07-26 ENCOUNTER — Ambulatory Visit: Admitting: Physician Assistant

## 2024-07-26 DIAGNOSIS — G4733 Obstructive sleep apnea (adult) (pediatric): Secondary | ICD-10-CM

## 2024-07-26 DIAGNOSIS — R002 Palpitations: Secondary | ICD-10-CM

## 2024-07-26 DIAGNOSIS — I10 Essential (primary) hypertension: Secondary | ICD-10-CM

## 2024-07-26 DIAGNOSIS — R0789 Other chest pain: Secondary | ICD-10-CM

## 2024-08-09 ENCOUNTER — Other Ambulatory Visit: Payer: Self-pay | Admitting: Family Medicine

## 2024-08-10 NOTE — Progress Notes (Unsigned)
 "  Cardiology Office Note    Date:  08/10/2024  ID:  Gerald Phillips, DOB 11-30-1980, MRN [id number] Cardiologist: Diannah SHAUNNA Maywood, MD { :  History of Present Illness:    Gerald Phillips is a 44 y.o. male with past medical history of HTN, gout, GERD and history of chest pain (coronary CTA in 12/2022 showing a coronary calcium score of 0) who presents to the office today for follow-up from a recent emergency department visit.  He was last exam by Dr. Mallipeddi in 12/2023 and recent evaluate in the ED for chest pain and troponin values had been negative and EKG showed no acute ST changes.  All of his cardiac workup had been unremarkable, therefore was recommended to obtain an ESR CRP, D-dimer and VQ scan for further assessment.  He was also referred to sleep medicine to establish care for his sleep apnea.  VQ scan was obtained and normal.  He was evaluated in the ED in 05/2024 and reported multiple complaints at that time including chest pain, left-sided numbness and headache.  Troponin T values were negative and EKG showed no acute ST changes.  Was recommend to follow-up with cardiology as an outpatient.  ROS: ***  Studies Reviewed:   EKG: EKG is*** ordered today and demonstrates ***   EKG Interpretation Date/Time:    Ventricular Rate:    PR Interval:    QRS Duration:    QT Interval:    QTC Calculation:   R Axis:      Text Interpretation:         Echocardiogram: 07/2022 IMPRESSIONS     1. Left ventricular ejection fraction, by estimation, is 60 to 65%. The  left ventricle has normal function. The left ventricle has no regional  wall motion abnormalities. There is moderate left ventricular hypertrophy.  Left ventricular diastolic  parameters are consistent with Grade I diastolic dysfunction (impaired  relaxation).   2. Right ventricular systolic function is normal. The right ventricular  size is normal.   3. The mitral valve is normal in structure. Trivial mitral valve   regurgitation. No evidence of mitral stenosis.   4. The aortic valve is tricuspid. Aortic valve regurgitation is not  visualized. No aortic stenosis is present.   5. The inferior vena cava is normal in size with greater than 50%  respiratory variability, suggesting right atrial pressure of 3 mmHg.   Comparison(s): No prior Echocardiogram.   Event Monitor: 12/2022   Normal sinus rhythm ranging from 46-140 bpm with average HR 75 bpm.   No atrial or ventricular arrhythmias.   No AV block or pauses.   <1% PAC burden and <1% PVC burden.   Patient triggered events correlated with NSR and SVE.   Unremarkable event monitor.  Coronary CTA: 12/2022 FINDINGS: Coronary calcium score: The patient's coronary artery calcium score is 0, which places the patient in the N/A percentile.   Coronary arteries: Normal coronary origins.  Right dominance.   Right Coronary Artery: Normal caliber vessel, gives rise to PDA. No significant plaque or stenosis.   Left Main Coronary Artery: Normal caliber vessel. No significant plaque or stenosis.   Left Anterior Descending Coronary Artery: Normal caliber vessel. No significant plaque or stenosis. Gives rise to three small diagonal branches.   Left Circumflex Artery: Normal caliber vessel. No significant plaque or stenosis. Gives rise to large, branching first OM branch.   Aorta: Normal size, 33 mm at the mid ascending aorta (level of the PA bifurcation) measured double  oblique. No aortic atherosclerosis. No dissection seen in visualized portions of the aorta.   Aortic Valve: No calcifications. Trileaflet.   Other findings:   Normal pulmonary vein drainage into the left atrium.   Normal left atrial appendage without a thrombus.   Normal size of the pulmonary artery.   Normal appearance of the pericardium.   IMPRESSION: 1. No evidence of CAD, CADRADS = 0.   2. Coronary calcium score of 0. This was N/A percentile for age-, sex-, and race-  matched controls (under age cutoff).   3. Normal coronary origin with right dominance.    Risk Assessment/Calculations:   {Does this patient have ATRIAL FIBRILLATION?:[phone]} No BP recorded.  {Refresh Note OR Click here to enter BP  :1}***         Physical Exam:   VS:  There were no vitals taken for this visit.   Wt Readings from Last 3 Encounters:  07/22/24 223 lb (101.2 kg)  06/02/24 228 lb (103.4 kg)  05/01/24 223 lb 15.8 oz (101.6 kg)     GEN: Well nourished, well developed in no acute distress NECK: No JVD; No carotid bruits CARDIAC: ***RRR, no murmurs, rubs, gallops RESPIRATORY:  Clear to auscultation without rales, wheezing or rhonchi  ABDOMEN: Appears non-distended. No obvious abdominal masses. EXTREMITIES: No clubbing or cyanosis. No edema.  Distal pedal pulses are 2+ bilaterally.   Assessment and Plan:      {Are you ordering a CV Procedure (e.g. stress test, cath, DCCV, TEE, etc)?   Press F2        :[id number]}   Signed, Laymon CHRISTELLA Qua, PA-C   "

## 2024-08-11 ENCOUNTER — Ambulatory Visit: Admitting: Student

## 2024-10-21 ENCOUNTER — Ambulatory Visit: Admitting: Neurology
